# Patient Record
Sex: Male | Born: 1960 | Race: White | Hispanic: No | Marital: Single | Smoking: Current every day smoker
Health system: Southern US, Community
[De-identification: ages and names within clinical notes are randomized; demographics above are authoritative.]

## PROBLEM LIST (undated history)

## (undated) DIAGNOSIS — I739 Peripheral vascular disease, unspecified: Secondary | ICD-10-CM

## (undated) DIAGNOSIS — F419 Anxiety disorder, unspecified: Secondary | ICD-10-CM

## (undated) DIAGNOSIS — F17200 Nicotine dependence, unspecified, uncomplicated: Secondary | ICD-10-CM

## (undated) DIAGNOSIS — R066 Hiccough: Secondary | ICD-10-CM

## (undated) DIAGNOSIS — E785 Hyperlipidemia, unspecified: Secondary | ICD-10-CM

## (undated) DIAGNOSIS — I10 Essential (primary) hypertension: Secondary | ICD-10-CM

## (undated) DIAGNOSIS — K029 Dental caries, unspecified: Secondary | ICD-10-CM

## (undated) DIAGNOSIS — I639 Cerebral infarction, unspecified: Secondary | ICD-10-CM

## (undated) DIAGNOSIS — L03119 Cellulitis of unspecified part of limb: Secondary | ICD-10-CM

## (undated) DIAGNOSIS — I2510 Atherosclerotic heart disease of native coronary artery without angina pectoris: Secondary | ICD-10-CM

## (undated) DIAGNOSIS — L02619 Cutaneous abscess of unspecified foot: Secondary | ICD-10-CM

## (undated) DIAGNOSIS — M47817 Spondylosis without myelopathy or radiculopathy, lumbosacral region: Secondary | ICD-10-CM

## (undated) DIAGNOSIS — M359 Systemic involvement of connective tissue, unspecified: Secondary | ICD-10-CM

## (undated) DIAGNOSIS — I219 Acute myocardial infarction, unspecified: Secondary | ICD-10-CM

## (undated) HISTORY — PX: LEG SURGERY: SHX1003

## (undated) HISTORY — DX: Cerebral infarction, unspecified: I63.9

## (undated) HISTORY — DX: Peripheral vascular disease, unspecified: I73.9

## (undated) HISTORY — DX: Atherosclerotic heart disease of native coronary artery without angina pectoris: I25.10

---

## 2007-09-05 ENCOUNTER — Ambulatory Visit: Payer: Self-pay | Admitting: Cardiology

## 2011-01-17 HISTORY — PX: INTRADISCAL INJECTION: SHX1840

## 2011-01-19 ENCOUNTER — Other Ambulatory Visit: Payer: Self-pay | Admitting: Neurosurgery

## 2011-01-19 DIAGNOSIS — M5126 Other intervertebral disc displacement, lumbar region: Secondary | ICD-10-CM

## 2011-01-25 ENCOUNTER — Ambulatory Visit
Admission: RE | Admit: 2011-01-25 | Discharge: 2011-01-25 | Disposition: A | Discharge status: Home / Self Care | Payer: Medicaid Other | Source: Ambulatory Visit | Attending: Neurosurgery | Admitting: Neurosurgery

## 2011-01-25 DIAGNOSIS — M5126 Other intervertebral disc displacement, lumbar region: Secondary | ICD-10-CM

## 2011-01-25 MED ORDER — METHYLPREDNISOLONE ACETATE 40 MG/ML INJ SUSP (RADIOLOG
120.0000 mg | Freq: Once | INTRAMUSCULAR | Status: AC
  Administered 2011-01-25: 120 mg via EPIDURAL

## 2011-01-25 MED ORDER — IOHEXOL 180 MG/ML  SOLN
1.0000 mL | Freq: Once | INTRAMUSCULAR | Status: AC | PRN
  Administered 2011-01-25: 1 mL via EPIDURAL

## 2011-02-28 ENCOUNTER — Other Ambulatory Visit: Payer: Self-pay | Admitting: Neurosurgery

## 2011-02-28 DIAGNOSIS — M5126 Other intervertebral disc displacement, lumbar region: Secondary | ICD-10-CM

## 2011-03-14 ENCOUNTER — Ambulatory Visit
Admission: RE | Admit: 2011-03-14 | Discharge: 2011-03-14 | Disposition: A | Discharge status: Home / Self Care | Payer: Medicaid Other | Source: Ambulatory Visit | Attending: Neurosurgery | Admitting: Neurosurgery

## 2011-03-14 DIAGNOSIS — M5126 Other intervertebral disc displacement, lumbar region: Secondary | ICD-10-CM

## 2011-03-14 MED ORDER — IOHEXOL 180 MG/ML  SOLN
1.0000 mL | Freq: Once | INTRAMUSCULAR | Status: AC | PRN
  Administered 2011-03-14: 1 mL via EPIDURAL

## 2011-03-14 MED ORDER — METHYLPREDNISOLONE ACETATE 40 MG/ML INJ SUSP (RADIOLOG
120.0000 mg | Freq: Once | INTRAMUSCULAR | Status: AC
  Administered 2011-03-14: 120 mg via EPIDURAL

## 2014-07-04 ENCOUNTER — Encounter (HOSPITAL_COMMUNITY)
Admission: EM | Disposition: A | Discharge status: Home / Self Care | Payer: Medicaid Other | Source: Home / Self Care | Attending: Cardiovascular Disease

## 2014-07-04 ENCOUNTER — Encounter: Payer: Self-pay | Admitting: Cardiology

## 2014-07-04 ENCOUNTER — Inpatient Hospital Stay (HOSPITAL_COMMUNITY)
Admission: EM | Admit: 2014-07-04 | Discharge: 2014-07-06 | DRG: 247 | Disposition: A | Discharge status: Home / Self Care | Payer: Medicare Other | Attending: Cardiovascular Disease | Admitting: Cardiovascular Disease

## 2014-07-04 DIAGNOSIS — I10 Essential (primary) hypertension: Secondary | ICD-10-CM | POA: Diagnosis present

## 2014-07-04 DIAGNOSIS — I2510 Atherosclerotic heart disease of native coronary artery without angina pectoris: Secondary | ICD-10-CM | POA: Diagnosis present

## 2014-07-04 DIAGNOSIS — I2119 ST elevation (STEMI) myocardial infarction involving other coronary artery of inferior wall: Secondary | ICD-10-CM | POA: Diagnosis present

## 2014-07-04 DIAGNOSIS — I2121 ST elevation (STEMI) myocardial infarction involving left circumflex coronary artery: Secondary | ICD-10-CM

## 2014-07-04 DIAGNOSIS — I213 ST elevation (STEMI) myocardial infarction of unspecified site: Secondary | ICD-10-CM | POA: Diagnosis present

## 2014-07-04 DIAGNOSIS — F17200 Nicotine dependence, unspecified, uncomplicated: Secondary | ICD-10-CM | POA: Diagnosis present

## 2014-07-04 DIAGNOSIS — R945 Abnormal results of liver function studies: Secondary | ICD-10-CM

## 2014-07-04 DIAGNOSIS — R7989 Other specified abnormal findings of blood chemistry: Secondary | ICD-10-CM

## 2014-07-04 DIAGNOSIS — E785 Hyperlipidemia, unspecified: Secondary | ICD-10-CM

## 2014-07-04 DIAGNOSIS — Z720 Tobacco use: Secondary | ICD-10-CM | POA: Diagnosis not present

## 2014-07-04 DIAGNOSIS — F17210 Nicotine dependence, cigarettes, uncomplicated: Secondary | ICD-10-CM | POA: Diagnosis present

## 2014-07-04 HISTORY — DX: Essential (primary) hypertension: I10

## 2014-07-04 HISTORY — PX: LEFT HEART CATHETERIZATION WITH CORONARY ANGIOGRAM: SHX5451

## 2014-07-04 HISTORY — DX: Nicotine dependence, unspecified, uncomplicated: F17.200

## 2014-07-04 HISTORY — DX: Spondylosis without myelopathy or radiculopathy, lumbosacral region: M47.817

## 2014-07-04 HISTORY — DX: Anxiety disorder, unspecified: F41.9

## 2014-07-04 LAB — COMPREHENSIVE METABOLIC PANEL
ALK PHOS: 55 U/L (ref 39–117)
ALT: 58 U/L — ABNORMAL HIGH (ref 0–53)
AST: 241 U/L — ABNORMAL HIGH (ref 0–37)
Albumin: 3.3 g/dL — ABNORMAL LOW (ref 3.5–5.2)
Anion gap: 9 (ref 5–15)
BILIRUBIN TOTAL: 0.6 mg/dL (ref 0.3–1.2)
BUN: 8 mg/dL (ref 6–23)
CALCIUM: 8.7 mg/dL (ref 8.4–10.5)
CO2: 23 mmol/L (ref 19–32)
Chloride: 102 mmol/L (ref 96–112)
Creatinine, Ser: 0.97 mg/dL (ref 0.50–1.35)
GFR calc Af Amer: >90 mL/min (ref >90)
Glucose, Bld: 161 mg/dL — ABNORMAL HIGH (ref 70–99)
Potassium: 3.8 mmol/L (ref 3.5–5.1)
SODIUM: 134 mmol/L — AB (ref 135–145)
Total Protein: 6.6 g/dL (ref 6.0–8.3)

## 2014-07-04 LAB — CBC WITH DIFFERENTIAL/PLATELET
BASOS ABS: 0.1 10*3/uL (ref 0.0–0.1)
BASOS PCT: 1 % (ref 0–1)
Eosinophils Absolute: 0.2 10*3/uL (ref 0.0–0.7)
Eosinophils Relative: 2 % (ref 0–5)
HCT: 43.1 % (ref 39.0–52.0)
Hemoglobin: 14.9 g/dL (ref 13.0–17.0)
LYMPHS PCT: 15 % (ref 12–46)
Lymphs Abs: 1.7 10*3/uL (ref 0.7–4.0)
MCH: 32 pg (ref 26.0–34.0)
MCHC: 34.6 g/dL (ref 30.0–36.0)
MCV: 92.7 fL (ref 78.0–100.0)
Monocytes Absolute: 0.7 10*3/uL (ref 0.1–1.0)
Monocytes Relative: 7 % (ref 3–12)
NEUTROS ABS: 8.4 10*3/uL — AB (ref 1.7–7.7)
NEUTROS PCT: 75 % (ref 43–77)
PLATELETS: 152 10*3/uL (ref 150–400)
RBC: 4.65 MIL/uL (ref 4.22–5.81)
RDW: 13.3 % (ref 11.5–15.5)
WBC: 11 10*3/uL — AB (ref 4.0–10.5)

## 2014-07-04 LAB — TROPONIN I: Troponin I: 45.41 ng/mL (ref <0.031)

## 2014-07-04 LAB — TSH: TSH: 0.597 u[IU]/mL (ref 0.350–4.500)

## 2014-07-04 SURGERY — LEFT HEART CATHETERIZATION WITH CORONARY ANGIOGRAM
Anesthesia: LOCAL

## 2014-07-04 MED ORDER — ACETAMINOPHEN 325 MG PO TABS
650.0000 mg | ORAL_TABLET | ORAL | Status: DC | PRN
  Administered 2014-07-05: 650 mg via ORAL
  Filled 2014-07-04: qty 2

## 2014-07-04 MED ORDER — SODIUM CHLORIDE 0.9 % IV SOLN
INTRAVENOUS | Status: DC
  Administered 2014-07-04: 21:00:00 via INTRAVENOUS

## 2014-07-04 MED ORDER — FENTANYL CITRATE 0.05 MG/ML IJ SOLN
INTRAMUSCULAR | Status: AC
  Filled 2014-07-04: qty 2

## 2014-07-04 MED ORDER — BIVALIRUDIN 250 MG IV SOLR
INTRAVENOUS | Status: AC
  Filled 2014-07-04: qty 250

## 2014-07-04 MED ORDER — ONDANSETRON HCL 4 MG/2ML IJ SOLN: 4.0000 mg | Freq: Four times a day (QID) | INTRAMUSCULAR | Status: DC | PRN

## 2014-07-04 MED ORDER — ATORVASTATIN CALCIUM 80 MG PO TABS: 80.0000 mg | ORAL_TABLET | Freq: Every day | ORAL | Status: DC

## 2014-07-04 MED ORDER — HEPARIN (PORCINE) IN NACL 2-0.9 UNIT/ML-% IJ SOLN
INTRAMUSCULAR | Status: AC
  Filled 2014-07-04: qty 1000

## 2014-07-04 MED ORDER — SODIUM CHLORIDE 0.9 % IV SOLN
1.0000 mg/kg/h | INTRAVENOUS | Status: DC
  Administered 2014-07-04: 1 mg/kg/h via INTRAVENOUS
  Filled 2014-07-04: qty 250

## 2014-07-04 MED ORDER — NITROGLYCERIN 1 MG/10 ML FOR IR/CATH LAB
INTRA_ARTERIAL | Status: AC
  Filled 2014-07-04: qty 10

## 2014-07-04 MED ORDER — ASPIRIN 81 MG PO CHEW
81.0000 mg | CHEWABLE_TABLET | Freq: Every day | ORAL | Status: DC
  Administered 2014-07-05: 81 mg via ORAL
  Filled 2014-07-04: qty 1

## 2014-07-04 MED ORDER — ONDANSETRON HCL 4 MG/2ML IJ SOLN
4.0000 mg | Freq: Four times a day (QID) | INTRAMUSCULAR | Status: DC | PRN
  Administered 2014-07-04: 4 mg via INTRAVENOUS
  Filled 2014-07-04: qty 2

## 2014-07-04 MED ORDER — ZOLPIDEM TARTRATE 5 MG PO TABS
5.0000 mg | ORAL_TABLET | Freq: Every evening | ORAL | Status: DC | PRN
  Administered 2014-07-05: 5 mg via ORAL
  Filled 2014-07-04: qty 1

## 2014-07-04 MED ORDER — ASPIRIN EC 81 MG PO TBEC: 81.0000 mg | DELAYED_RELEASE_TABLET | Freq: Every day | ORAL | Status: DC

## 2014-07-04 MED ORDER — METOPROLOL TARTRATE 25 MG PO TABS
25.0000 mg | ORAL_TABLET | Freq: Two times a day (BID) | ORAL | Status: DC
  Administered 2014-07-04 – 2014-07-06 (×4): 25 mg via ORAL
  Filled 2014-07-04 (×5): qty 1

## 2014-07-04 MED ORDER — LIDOCAINE HCL (PF) 1 % IJ SOLN
INTRAMUSCULAR | Status: AC
  Filled 2014-07-04: qty 30

## 2014-07-04 MED ORDER — NITROGLYCERIN IN D5W 200-5 MCG/ML-% IV SOLN: 0.0000 ug/min | INTRAVENOUS | Status: DC

## 2014-07-04 MED ORDER — MORPHINE SULFATE 2 MG/ML IJ SOLN: 2.0000 mg | INTRAMUSCULAR | Status: DC | PRN

## 2014-07-04 MED ORDER — ALPRAZOLAM 0.25 MG PO TABS
0.2500 mg | ORAL_TABLET | Freq: Two times a day (BID) | ORAL | Status: DC | PRN
  Administered 2014-07-05: 0.25 mg via ORAL
  Filled 2014-07-04: qty 1

## 2014-07-04 MED ORDER — HYDRALAZINE HCL 20 MG/ML IJ SOLN
10.0000 mg | INTRAMUSCULAR | Status: DC | PRN
  Administered 2014-07-05: 10 mg via INTRAVENOUS
  Filled 2014-07-04: qty 1

## 2014-07-04 MED ORDER — NITROGLYCERIN 0.4 MG SL SUBL: 0.4000 mg | SUBLINGUAL_TABLET | SUBLINGUAL | Status: DC | PRN

## 2014-07-04 MED ORDER — NITROGLYCERIN IN D5W 200-5 MCG/ML-% IV SOLN
INTRAVENOUS | Status: AC
  Filled 2014-07-04: qty 250

## 2014-07-04 MED ORDER — PANTOPRAZOLE SODIUM 40 MG PO TBEC
40.0000 mg | DELAYED_RELEASE_TABLET | Freq: Every day | ORAL | Status: DC
  Administered 2014-07-05 – 2014-07-06 (×2): 40 mg via ORAL
  Filled 2014-07-04 (×2): qty 1

## 2014-07-04 MED ORDER — MIDAZOLAM HCL 2 MG/2ML IJ SOLN
INTRAMUSCULAR | Status: AC
  Filled 2014-07-04: qty 2

## 2014-07-04 MED ORDER — TICAGRELOR 90 MG PO TABS
ORAL_TABLET | ORAL | Status: AC
  Filled 2014-07-04: qty 1

## 2014-07-04 MED ORDER — ATORVASTATIN CALCIUM 80 MG PO TABS
80.0000 mg | ORAL_TABLET | Freq: Every day | ORAL | Status: DC
  Administered 2014-07-05: 80 mg via ORAL
  Filled 2014-07-04 (×2): qty 1

## 2014-07-04 MED ORDER — ACETAMINOPHEN 325 MG PO TABS
650.0000 mg | ORAL_TABLET | ORAL | Status: DC | PRN
  Administered 2014-07-04 – 2014-07-06 (×2): 650 mg via ORAL
  Filled 2014-07-04 (×2): qty 2

## 2014-07-04 MED ORDER — LISINOPRIL 5 MG PO TABS
5.0000 mg | ORAL_TABLET | Freq: Every day | ORAL | Status: DC
  Administered 2014-07-04 – 2014-07-06 (×3): 5 mg via ORAL
  Filled 2014-07-04 (×3): qty 1

## 2014-07-04 MED ORDER — TICAGRELOR 90 MG PO TABS
90.0000 mg | ORAL_TABLET | Freq: Two times a day (BID) | ORAL | Status: DC
  Administered 2014-07-05 – 2014-07-06 (×3): 90 mg via ORAL
  Filled 2014-07-04 (×6): qty 1

## 2014-07-04 NOTE — Progress Notes (Signed)
Chaplain responded to Code STEMI.  EMT indicated that wife was supposed to come, and she was last seen leaving [facility]. Chaplain waited in ED area and then checked in with staff there and with Cath docs (procedure was underway).  Please page if family arrives.    Debby Budalacios, Nolia Tschantz [place], [place]plain [phone]

## 2014-07-04 NOTE — Progress Notes (Signed)
eLink Physician-Brief Progress Note Patient Name: Eugene Parker DOB: 04/11/1961 MRN: [id number]  [name] Date of Service  07/04/2014  HPI/Events of Note  [age] [facility].  Now s/p CC with stent to circumflex.  No major PMH with the exception of smoker/HTN.  Currently patient is alert HD stable with sats of 99%.  He is on NTG and anticoagulation gtts.  eICU Interventions  Plan of care per primary cardiology team. Continue to monitor via [facility]     Intervention Category Evaluation Type: New Patient Evaluation  [name] 07/04/2014, 9:44 PM

## 2014-07-04 NOTE — H&P (Signed)
     Patient ID: Eugene Parker MRN: [id number], DOB/AGE: 56/08/1960   Admit date: 07/04/2014   Primary Physician: No primary care provider on file. Primary Cardiologist: [place] from [place]  HPI: 54 y/o single male, smoker, HTN, no prior histpry of CAD, [facility] with SSCP which began about 6:30 pm. EKG shows inferior and lateral ST elevation. STEMI activated and the pt was [facility] by EMS [facility]. He is stable but continuing to have significant chest pain on arrival.   Problem List: Past Medical History  Diagnosis Date  . DJD (degenerative joint disease), lumbosacral     No past surgical history on file.   Allergies: No Known Allergies   Home Medications "BP med".     Unable to obtain family history at this time   History   Social History  . Marital Status: Single    Spouse Name: N/A  . Number of Children: N/A  . Years of Education: N/A   Occupational History  . Not on file.   Social History Main Topics  . Smoking status: Not on file  . Smokeless tobacco: Not on file  . Alcohol Use: Not on file  . Drug Use: Not on file  . Sexual Activity: Not on file   Other Topics Concern  . Not on file   Social History Narrative  . No narrative on file     Review of Systems: not obtained pt in cath lab  Physical Exam: B/P 149/74      , Pulse, 66 R- 16  General appearance: alert, cooperative and mild distress Neck: no carotid bruit and no JVD Lungs: scattered wheezing Heart: regular rate and rhythm Abdomen: soft, non-tender; bowel sounds normal; no masses,  no organomegaly Extremities: extremities normal, atraumatic, no cyanosis or edema Pulses: 2+ and symmetric Rt radial pulse 1+ Skin: Skin color, texture, turgor normal. No rashes or lesions Neurologic: Grossly normal    Labs: [facility]    Radiology/Studies: No results found.  EKG: NSR, SB inferior, lateral STEMI  ASSESSMENT AND  PLAN:  Principal Problem:   Current smoker Active Problems:   ST elevation myocardial infarction (STEMI) of inferior wall   Essential hypertension   PLAN:  Urgent cath, no beta blocker secondary to bradycardia, add Lipitor 80 mg.   Deland PrettySigned, Alizandra Loh K, PA-C 07/04/2014, 7:39 PM

## 2014-07-04 NOTE — CV Procedure (Signed)
Eugene Parker is a 54 y.o. male    [id number] LOCATION:  FACILITY: [facility]  PHYSICIAN: Quay Burow, M.D. 04-30-1960   DATE OF PROCEDURE:  07/04/2014  DATE OF DISCHARGE:     CARDIAC CATHETERIZATION     History obtained from chart review.Eugene Parker is a 54 year old mildly overweight Caucasian male without prior cardiac history. His factors include tobacco abuse and hypertension. He developed chest pain for the first time earlier this evening and went to [facility] where he was noted to have inferolateral ST segment elevation and he was transferred to [facility] by EMS for urgent intervention.   PROCEDURE DESCRIPTION:   The patient was brought to the second floor [facility]iac cath lab in the postabsorptive state. He was premedicated with Valium 5 mg by mouth, IV Versed and fentanyl. His right groinwas prepped and shaved in usual sterile fashion. Xylocaine 1% was used for local anesthesia. A 6 French sheath was inserted into the right common femoral artery using standard Seldinger technique. The patient received 4000 units  of heparin  Intravenously in the [facility] emergency room prior to transport.  6 French right and left Judkins diagnostic catheters as well as a 6 French pigtail catheter were used for selective coronary angiography and left ventriculography respectively. Visipaque dye was used for the entirety of the case. Retrograde aortic, left ventricular and pullback pressures were recorded.    HEMODYNAMICS:    AO SYSTOLIC/AO DIASTOLIC: 470/96   LV SYSTOLIC/LV DIASTOLIC: 283/66  ANGIOGRAPHIC RESULTS:   1. Left main; normal  2. LAD; minor irregularities 3. Left circumflex; codominant with occluded mid AV groove after OM 3.  4. Right coronary artery; codominant with 40-50% segmental mid 7. Left ventriculography; RAO left ventriculogram was performed using  25 mL of Visipaque dye at 12 mL/second. The overall LVEF estimated  45-50 %  With wall  motion abnormalities moderate inferoapical and posterolateral hypokinesia  IMPRESSION:Eugene Parker has been occluded codominant circumflex with otherwise noncritical CAD. We will proceed with direct intervention using Angiomax, Brilenta, and drug eluting stent.  Procedure description: The patient received Angiomax bolus and infusion with an ACT of 435. A total of 185 mL of contrast was administered to the patient. Using a 6 [place] XB 3.5 similar guide catheter along with a 1/190 cm long pro-water guidewire and a 2 mm x 12 mm Euphora  balloon the lesion was crossed and angioplasty performed. The door to balloon time was 26 minutes. Following this  A 2.75 mm x 23 mm long Xience drug-eluting stent was then carefully positioned and deployed at 12 atm and postdilated with a 3 mm x 20 mm long Eagar Euphora balloon at 15 atm (3.1 mm) resulting in reduction with total occlusion to 0% residual with TIMI-3 flow and no dissection. The patient tolerated the procedure well. There are no hemodynamic or electrocardiographic sequela. The guidewire and catheter were removed and the sheath was secured.   Final impression: successful PCI and stenting of a occluded mid codominant circumflex in the setting of an acute inferolateral myocardial infarction with a door to balloon time 26 minutes using Angiomax, Proventil and drug-eluting stent. The patient had noncritical disease otherwise with preserved LV function. He'll be treated with usual medications including beta blocker, statin drug and ACE inhibitor. His sheath will be removed in several hours and pressure held. He will be fast tracked with anticipated discharge in 48-72 hours. He left the lab in stable condition.  Lorretta Harp MD, [facility] 07/04/2014 8:21 PM

## 2014-07-05 ENCOUNTER — Encounter (HOSPITAL_COMMUNITY): Payer: Self-pay | Admitting: Cardiovascular Disease

## 2014-07-05 DIAGNOSIS — I2119 ST elevation (STEMI) myocardial infarction involving other coronary artery of inferior wall: Principal | ICD-10-CM

## 2014-07-05 DIAGNOSIS — Z720 Tobacco use: Secondary | ICD-10-CM

## 2014-07-05 DIAGNOSIS — E785 Hyperlipidemia, unspecified: Secondary | ICD-10-CM

## 2014-07-05 DIAGNOSIS — I10 Essential (primary) hypertension: Secondary | ICD-10-CM

## 2014-07-05 DIAGNOSIS — R7989 Other specified abnormal findings of blood chemistry: Secondary | ICD-10-CM

## 2014-07-05 LAB — CBC
HCT: 41.9 % (ref 39.0–52.0)
Hemoglobin: 14.6 g/dL (ref 13.0–17.0)
MCH: 32.4 pg (ref 26.0–34.0)
MCHC: 34.8 g/dL (ref 30.0–36.0)
MCV: 93.1 fL (ref 78.0–100.0)
Platelets: 148 10*3/uL — ABNORMAL LOW (ref 150–400)
RBC: 4.5 MIL/uL (ref 4.22–5.81)
RDW: 13.4 % (ref 11.5–15.5)
WBC: 10.8 10*3/uL — ABNORMAL HIGH (ref 4.0–10.5)

## 2014-07-05 LAB — TROPONIN I
Troponin I: >80 ng/mL (ref <0.031)
Troponin I: 78.13 ng/mL (ref <0.031)

## 2014-07-05 LAB — GLUCOSE, CAPILLARY: Glucose-Capillary: 176 mg/dL — ABNORMAL HIGH (ref 70–99)

## 2014-07-05 LAB — BASIC METABOLIC PANEL
ANION GAP: 8 (ref 5–15)
BUN: 7 mg/dL (ref 6–23)
CO2: 23 mmol/L (ref 19–32)
Calcium: 8.8 mg/dL (ref 8.4–10.5)
Chloride: 104 mmol/L (ref 96–112)
Creatinine, Ser: 0.81 mg/dL (ref 0.50–1.35)
Glucose, Bld: 121 mg/dL — ABNORMAL HIGH (ref 70–99)
Potassium: 3.8 mmol/L (ref 3.5–5.1)
Sodium: 135 mmol/L (ref 135–145)

## 2014-07-05 LAB — LIPID PANEL
CHOL/HDL RATIO: 5.4 ratio
CHOLESTEROL: 151 mg/dL (ref 0–200)
HDL: 28 mg/dL — ABNORMAL LOW (ref >39)
LDL Cholesterol: 98 mg/dL (ref 0–99)
TRIGLYCERIDES: 127 mg/dL (ref <150)
VLDL: 25 mg/dL (ref 0–40)

## 2014-07-05 MED ORDER — OXYCODONE-ACETAMINOPHEN 5-325 MG PO TABS
2.0000 | ORAL_TABLET | Freq: Four times a day (QID) | ORAL | Status: DC | PRN
  Administered 2014-07-05 – 2014-07-06 (×4): 2 via ORAL
  Filled 2014-07-05 (×6): qty 2

## 2014-07-05 MED ORDER — SODIUM CHLORIDE 0.9 % IV SOLN: INTRAVENOUS | Status: DC | PRN

## 2014-07-05 MED ORDER — ATROPINE SULFATE 0.1 MG/ML IJ SOLN
INTRAMUSCULAR | Status: AC
  Filled 2014-07-05: qty 10

## 2014-07-05 NOTE — Progress Notes (Signed)
Phase I Cardiac Rehab  Pt was just waking up from nap and did not feel up to walking, but has been walking independently in hallway.  Reviewed pt d/c education.  Discussed diet, exercise, restriction, MI book, stent book and card, NTG use, when to call 911/MD.  Pt declined Phase II Cardiac Rehab stating that it would be too far to drive.  He was able to teach back but said he would try to work on some of the changes thinking he would not be willing to make all the changes.  We also discussed quitting smoking which he was open to and was given a fake cigarette to help.  He seemed more motivated with this than the other changes.  Pt voiced understanding and was encouraged to continue to walk.   We will f/u on Monday if still admitted. Fabio PierceJessica Ben Sanz, MA, ACSM RCEP

## 2014-07-05 NOTE — Progress Notes (Addendum)
Patient Name: Eugene Parker Date of Encounter: 07/05/2014  Principal Problem:   Current smoker Active Problems:   ST elevation myocardial infarction (STEMI) of inferior wall   Essential hypertension   STEMI (ST elevation myocardial infarction)   Length of Stay: 1  SUBJECTIVE  The patient feels well, denies chest pain or SOB.   CURRENT MEDS . aspirin  81 mg Oral Daily  . atorvastatin  80 mg Oral q1800  . lisinopril  5 mg Oral Daily  . metoprolol tartrate  25 mg Oral BID  . pantoprazole  40 mg Oral Q0600  . ticagrelor  90 mg Oral BID    OBJECTIVE  Filed Vitals:   07/05/14 0530 07/05/14 0600 07/05/14 0700 07/05/14 0800  BP: 115/55 104/67 118/61 106/63  Pulse: 69 67 64 62  Temp:      TempSrc:      Resp: 9 11 4 13   Height:      Weight:      SpO2: 99% 100% 100% 100%    Intake/Output Summary (Last 24 hours) at 07/05/14 0932 Last data filed at 07/05/14 0800  Gross per 24 hour  Intake 1478.81 ml  Output    600 ml  Net 878.81 ml   Filed Weights   07/04/14 1935 07/04/14 2100 07/05/14 0457  Weight: 220 lb (99.791 kg) 206 lb 2.1 oz (93.5 kg) 211 lb 10.3 oz (96 kg)    PHYSICAL EXAM  General: Pleasant, NAD. Neuro: Alert and oriented X 3. Moves all extremities spontaneously. Psych: Normal affect. HEENT:  Normal  Neck: Supple without bruits or JVD. Lungs:  Resp regular and unlabored, CTA. Heart: RRR no s3, s4, or murmurs. Abdomen: Soft, non-tender, non-distended, BS + x 4.  Extremities: No clubbing, cyanosis or edema. DP/PT/Radials 2+ and equal bilaterally.  Accessory Clinical Findings  CBC  Recent Labs  07/04/14 2222 07/05/14 0432  WBC 11.0* 10.8*  NEUTROABS 8.4*  --   HGB 14.9 14.6  HCT 43.1 41.9  MCV 92.7 93.1  PLT 152 148*   Basic Metabolic Panel  Recent Labs  07/04/14 2222 07/05/14 0432  NA 134* 135  K 3.8 3.8  CL 102 104  CO2 23 23  GLUCOSE 161* 121*  BUN 8 7  CREATININE 0.97 0.81  CALCIUM 8.7 8.8   Liver Function  Tests  Recent Labs  07/04/14 2222  AST 241*  ALT 58*  ALKPHOS 55  BILITOT 0.6  PROT 6.6  ALBUMIN 3.3*   No results for input(s): LIPASE, AMYLASE in the last 72 hours. Cardiac Enzymes  Recent Labs  07/04/14 2222 07/05/14 0432  TROPONINI 45.41* >80.00*    Recent Labs  07/05/14 0432  CHOL 151  HDL 28*  LDLCALC 98  TRIG 161127  CHOLHDL 5.4   Thyroid Function Tests  Recent Labs  07/04/14 2223  TSH 0.597   Radiology/Studies  No results found.  TELE: SR, PVCs, couplets  ECG: SR, resolved STE in inferolateral leads, now negative T waves in inferolateral leads  Left cardiac cath : 07/04/2014 ANGIOGRAPHIC RESULTS:  1. Left main; normal  2. LAD; minor irregularities 3. Left circumflex; codominant with occluded mid AV groove after OM 3.  4. Right coronary artery; codominant with 40-50% segmental mid 7. Left ventriculography; RAO left ventriculogram was performed using  25 mL of Visipaque dye at 12 mL/second. The overall LVEF estimated  45-50 % With wall motion abnormalities moderate inferoapical and posterolateral hypokinesia  IMPRESSION:[name] has been occluded codominant circumflex with otherwise noncritical  CAD. We will proceed with direct intervention using Angiomax, Brilenta, and drug eluting stent.   ASSESSMENT AND PLAN  54 year old male   1. CAD, STEMI on 07/04/2014 with occluded codominant circumflex with otherwise noncritical CAD, s/p successful PCI and stenting with DES - continue DAPT for at least 1 year with ASA 81 mg PO daily and Brillinta - continue high dose atorvastatin 80 mg po daily - contiue metoprolol and lisinopril - Crea normal  2. Hypertension - controlled  3. Hyperlipidemia - on atorvastatin 80 mg po daily  4. Elevated LFTs - we will recheck today, if uptrending we will decrease the dose of atorvastatin  5. Smoking cessation - counseling provided  Transfer to the telemetry.    Signed, Lars Masson MD,  [facility] 07/05/2014

## 2014-07-05 NOTE — Progress Notes (Signed)
Pulled R femoral sheath beginning at 0427. Vitals stable throughout. Pulses palpable prior and throughout. Atropine at bedside. No complications. Pulled back 10cc blood prior. Held manual pressure for 20 minutes. No bleeding noted upon finishing holding pressure at 0447. Site at level 0 upon completion. Post sheath education completed. Will continue to monitor patient.

## 2014-07-06 DIAGNOSIS — R945 Abnormal results of liver function studies: Secondary | ICD-10-CM

## 2014-07-06 DIAGNOSIS — E785 Hyperlipidemia, unspecified: Secondary | ICD-10-CM

## 2014-07-06 DIAGNOSIS — R7989 Other specified abnormal findings of blood chemistry: Secondary | ICD-10-CM

## 2014-07-06 LAB — HEPATIC FUNCTION PANEL
ALT: 49 U/L (ref 0–53)
AST: 87 U/L — ABNORMAL HIGH (ref 0–37)
Albumin: 3.2 g/dL — ABNORMAL LOW (ref 3.5–5.2)
Alkaline Phosphatase: 52 U/L (ref 39–117)
BILIRUBIN TOTAL: 0.5 mg/dL (ref 0.3–1.2)
Bilirubin, Direct: 0.1 mg/dL (ref 0.0–0.5)
Indirect Bilirubin: 0.4 mg/dL (ref 0.3–0.9)
TOTAL PROTEIN: 6.8 g/dL (ref 6.0–8.3)

## 2014-07-06 MED ORDER — ATORVASTATIN CALCIUM 80 MG PO TABS: 80.0000 mg | ORAL_TABLET | Freq: Every day | ORAL | Status: DC

## 2014-07-06 MED ORDER — ASPIRIN 81 MG PO CHEW: 81.0000 mg | CHEWABLE_TABLET | Freq: Every day | ORAL | Status: DC

## 2014-07-06 MED ORDER — TICAGRELOR 90 MG PO TABS: 90.0000 mg | ORAL_TABLET | Freq: Two times a day (BID) | ORAL | Status: DC

## 2014-07-06 MED ORDER — PANTOPRAZOLE SODIUM 40 MG PO TBEC: 40.0000 mg | DELAYED_RELEASE_TABLET | Freq: Every day | ORAL | Status: DC

## 2014-07-06 MED ORDER — NITROGLYCERIN 0.4 MG SL SUBL: 0.4000 mg | SUBLINGUAL_TABLET | SUBLINGUAL | Status: DC | PRN

## 2014-07-06 MED ORDER — LISINOPRIL 5 MG PO TABS: 5.0000 mg | ORAL_TABLET | Freq: Every day | ORAL | Status: DC

## 2014-07-06 NOTE — Discharge Summary (Signed)
Physician Discharge Summary  Patient ID: Eugene Parker MRN: [id number] DOB/AGE: 56/08/1960 56 y.o.  Primary Cardiologist: Dr. Allyson SabalBerry ([place])  Admit date: 07/04/2014 Discharge date: 07/06/2014  Admission Diagnoses: Inferior STEMI  Discharge Diagnoses:  Principal Problem:   Current smoker Active Problems:   ST elevation myocardial infarction (STEMI) of inferior wall   Essential hypertension   Hyperlipidemia   Elevated LFTs   Discharged Condition: stable  Hospital Course: 54 y/o single male, smoker, HTN, no prior history of CAD, [facility] with SSCP which began about 6:30 pm. EKG showed inferior and lateral ST elevation. STEMI activated and the pt [facility]. On arrival, he was stable but with ongoing CP. The procedure was performed by Dr. Allyson SabalBerry, he was found to have occluded codominant circumflex with otherwise noncritical CAD. The occluded circumflex was treated with PCI + DES. LVEF by cath was 45-50%. He tolerated the procedure well and left the cath lab in stable condition. His CP resolved. Troponin peaked at >80 but serial enzymes showed downward trend. He was placed on DAPT with ASA + Brilinta along with BB, ACE and statin. It should be noted that initial LFTs were elevated with AST at 241 and ALT at 58, however follow-up studies showed significant downward trend with AST at 87 and ALT at 49. High dose Lipitor was continued. He had no post cath complications. He remained CP free and did well with cardiac rehab. HR and BP remained stable. He was last seen and examined by Dr. Myrtis SerKatz who determined he was stable for discharge home. Post hospital f/u will be arranged in 1-2 weeks. He will be followed by Dr. Allyson SabalBerry.   Consults: None   Cardiac Panel (last 3 results)  Recent Labs  07/04/14 2222 07/05/14 0432 07/05/14 1600  TROPONINI 45.41* >80.00* 78.13*     Significant Diagnostic Studies:   LHC 07/04/14 HEMODYNAMICS:     AO SYSTOLIC/AO DIASTOLIC: 170/88  LV SYSTOLIC/LV DIASTOLIC: 166/26  ANGIOGRAPHIC RESULTS:   1. Left main; normal  2. LAD; minor irregularities 3. Left circumflex; codominant with occluded mid AV groove after OM 3.  4. Right coronary artery; codominant with 40-50% segmental mid 7. Left ventriculography; RAO left ventriculogram was performed using  25 mL of Visipaque dye at 12 mL/second. The overall LVEF estimated  45-50 % With wall motion abnormalities moderate inferoapical and posterolateral hypokinesia   Treatments: See Hospital Course  Discharge Exam: Blood pressure 124/63, pulse 73, temperature 98 F (36.7 C), temperature source Oral, resp. rate 18, height 5\' 11"  (1.803 m), weight 209 lb 14.4 oz (95.21 kg), SpO2 98 %.   Disposition: Home     Medication List    TAKE these medications        aspirin 81 MG chewable tablet  Chew 1 tablet (81 mg total) by mouth daily.     atorvastatin 80 MG tablet  Commonly known as:  LIPITOR  Take 1 tablet (80 mg total) by mouth daily at 6 PM.     lisinopril 5 MG tablet  Commonly known as:  PRINIVIL,ZESTRIL  Take 1 tablet (5 mg total) by mouth daily.     metoprolol tartrate 25 MG tablet  Commonly known as:  LOPRESSOR  Take 25 mg by mouth 2 (two) times daily.     nitroGLYCERIN 0.4 MG SL tablet  Commonly known as:  NITROSTAT  Place 1 tablet (0.4 mg total) under the tongue every 5 (five) minutes x 3 doses as  needed for chest pain.     oxyCODONE-acetaminophen 10-325 MG per tablet  Commonly known as:  PERCOCET  Take 1 tablet by mouth every 6 (six) hours as needed for pain (for shoulder).     pantoprazole 40 MG tablet  Commonly known as:  PROTONIX  Take 1 tablet (40 mg total) by mouth daily at 6 (six) AM.     ticagrelor 90 MG Tabs tablet  Commonly known as:  BRILINTA  Take 1 tablet (90 mg total) by mouth 2 (two) times daily.     ticagrelor 90 MG Tabs tablet  Commonly known as:  BRILINTA  Take 1 tablet (90 mg total)  by mouth 2 (two) times daily.       Follow-up Information    Follow up with Runell Gess, MD.   Specialty:  Cardiology   Why:  our office will call you with a follow-up appointment   Contact information:   [address] [place] [address] [phone]       TIME SPENT ON DISCHARGE SUMMARY, INCLUDING PHYSICIAN TIME: >30 MINUTES  Signed: Robbie Lis 07/06/2014, 4:56 PM  Patient seen and examined. I agree with the assessment and plan as detailed above. See also my additional thoughts below.   Please refer to my progress note from today also. The patient is quite stable after PCI for an acute coronary syndrome. He is stable to go home today. I made the decision for him to go home. I agree with all of the plans made in the note above.  Willa Rough, MD, [facility] 07/07/2014 7:41 AM

## 2014-07-06 NOTE — Care Management Note (Signed)
    Page 1 of 1   07/06/2014     4:39:15 PM CARE MANAGEMENT NOTE 07/06/2014  Patient:  Eugene Parker, Eugene Parker   Account Number:  [id number]  Date Initiated:  07/06/2014  Documentation initiated by:  [facility]  Subjective/Objective Assessment:   adm: tx to [facility] with acute inferolateral MI.     Action/Plan:   discharge planning   Anticipated DC Date:  07/06/2014   Anticipated DC Plan:  [place]  CM consult  Medication Assistance      Choice offered to / List presented to:             Status of service:  Completed, signed off Medicare Important Message given?   (If response is "NO", the following Medicare IM given date fields will be blank) Date Medicare IM given:   Medicare IM given by:   Date Additional Medicare IM given:   Additional Medicare IM given by:    Discharge Disposition:  HOME/SELF CARE  Per UR Regulation:    If discussed at Long Length of Stay Meetings, dates discussed:    Comments:  07/06/14 16:35 Cm met with pt in room and gave pt 30 day free trial card for brilinta.  Pt verbalizes understanding this will give the office staff enough time to get the meidcation authorized to cover the cost of the refills. Despite EPIC, pt DOES HAVE BOTH MEDICAID AND MEDICARE. System reflects no insurance. Pt states he has both.  No other Cm needs were communicated.  Mariane Masters, BSN, Cm [phone].

## 2014-07-06 NOTE — Progress Notes (Signed)
Discharged to home with family office visits in place teaching done Discharged to home with family office visits in place teaching done  

## 2014-07-06 NOTE — Progress Notes (Signed)
Report from lab k-2.7 informed Eugene Parker he said " I will put in orders"

## 2014-07-06 NOTE — Progress Notes (Signed)
SUBJECTIVE: The patient had an ST elevation MI on July 04, 2014. He was treated very rapidly with PCI to the circumflex. He has done very well. The plan was for him to be fast tracked to go home if stable. He is been ambulating without difficulties. There was mention yesterday that he has some elevation of his LFTs. These will be repeated this morning. If they are rising, his statin dose will have to be reduced.   Filed Vitals:   07/05/14 1019 07/05/14 1300 07/05/14 2052 07/06/14 0539  BP: 154/76 97/62 118/58 125/84  Pulse: 70 63 68 68  Temp:  97.7 F (36.5 C) 97.6 F (36.4 C) 99.3 F (37.4 C)  TempSrc:  Oral Oral Oral  Resp:  16 18 18   Height:      Weight:    209 lb 14.4 oz (95.21 kg)  SpO2:  100% 98% 98%     Intake/Output Summary (Last 24 hours) at 07/06/14 1028 Last data filed at 07/06/14 0900  Gross per 24 hour  Intake    720 ml  Output      0 ml  Net    720 ml    LABS: Basic Metabolic Panel:  Recent Labs  11/91/4703/18/16 2222 07/05/14 0432  NA 134* 135  K 3.8 3.8  CL 102 104  CO2 23 23  GLUCOSE 161* 121*  BUN 8 7  CREATININE 0.97 0.81  CALCIUM 8.7 8.8   Liver Function Tests:  Recent Labs  07/04/14 2222  AST 241*  ALT 58*  ALKPHOS 55  BILITOT 0.6  PROT 6.6  ALBUMIN 3.3*   No results for input(s): LIPASE, AMYLASE in the last 72 hours. CBC:  Recent Labs  07/04/14 2222 07/05/14 0432  WBC 11.0* 10.8*  NEUTROABS 8.4*  --   HGB 14.9 14.6  HCT 43.1 41.9  MCV 92.7 93.1  PLT 152 148*   Cardiac Enzymes:  Recent Labs  07/04/14 2222 07/05/14 0432 07/05/14 1600  TROPONINI 45.41* >80.00* 78.13*   BNP: Invalid input(s): POCBNP D-Dimer: No results for input(s): DDIMER in the last 72 hours. Hemoglobin A1C: No results for input(s): HGBA1C in the last 72 hours. Fasting Lipid Panel:  Recent Labs  07/05/14 0432  CHOL 151  HDL 28*  LDLCALC 98  TRIG [id number]  CHOLHDL 5.4   Thyroid Function Tests:  Recent Labs  07/04/14 2223  TSH 0.597     RADIOLOGY: No results found.  PHYSICAL EXAM   patient is oriented to person time and place. Affect is normal. He looks great. Lungs are clear. Respiratory effort is not labored. Cardiac exam reveals an S1 and S2. Abdomen is soft. There is no peripheral edema. The cath site in his right groin is completely stable.   TELEMETRY:  I have reviewed telemetry today July 06, 2014. There is normal sinus rhythm.  ASSESSMENT AND PLAN:    Current smoker    ST elevation myocardial infarction (STEMI) of inferior wall     Patient was treated very rapidly with PCI. He is stable and will be able to go home later today.    Essential hypertension    Hyperlipidemia     He is on a statin now. We will await follow-up liver function studies before deciding on the final dose of his statin.    Elevated LFTs    I have just ordered stat follow-up liver function studies. If they are stable he can remain on his current statin dose. If they're going up, we  will have to lower his statin dose.  His acute MI was in the circumflex distribution. It was noted that he could be on the fast track to go home if stable. He is doing extremely well. He can be discharged late this afternoon.   Willa Rough 07/06/2014 10:28 AM

## 2014-07-07 LAB — POCT I-STAT, CHEM 8
BUN: 9 mg/dL (ref 6–23)
Calcium, Ion: 1.24 mmol/L — ABNORMAL HIGH (ref 1.12–1.23)
Chloride: 103 mmol/L (ref 96–112)
Creatinine, Ser: 1 mg/dL (ref 0.50–1.35)
Glucose, Bld: 143 mg/dL — ABNORMAL HIGH (ref 70–99)
HCT: 44 % (ref 39.0–52.0)
HEMOGLOBIN: 15 g/dL (ref 13.0–17.0)
Potassium: 3.9 mmol/L (ref 3.5–5.1)
SODIUM: 140 mmol/L (ref 135–145)
TCO2: 22 mmol/L (ref 0–100)

## 2014-07-07 LAB — POCT ACTIVATED CLOTTING TIME: Activated Clotting Time: 435 seconds

## 2014-07-07 MED FILL — Sodium Chloride IV Soln 0.9%: INTRAVENOUS | Qty: 50 | Status: AC

## 2014-07-16 ENCOUNTER — Encounter: Payer: Self-pay | Admitting: Cardiology

## 2014-07-16 ENCOUNTER — Telehealth: Payer: Self-pay | Admitting: Cardiology

## 2014-07-18 NOTE — Telephone Encounter (Signed)
Closed Encounter  °

## 2014-08-06 ENCOUNTER — Ambulatory Visit: Payer: Medicaid Other | Admitting: Cardiology

## 2014-10-24 ENCOUNTER — Encounter: Payer: Self-pay | Admitting: Cardiology

## 2014-10-24 ENCOUNTER — Ambulatory Visit (INDEPENDENT_AMBULATORY_CARE_PROVIDER_SITE_OTHER): Payer: Medicare Other | Admitting: Cardiology

## 2014-10-24 VITALS — BP 119/71 | HR 72 | Ht 71.0 in | Wt 214.0 lb

## 2014-10-24 DIAGNOSIS — I2510 Atherosclerotic heart disease of native coronary artery without angina pectoris: Secondary | ICD-10-CM | POA: Diagnosis not present

## 2014-10-24 MED ORDER — TICAGRELOR 90 MG PO TABS: 90.0000 mg | ORAL_TABLET | Freq: Two times a day (BID) | ORAL | Status: DC

## 2014-10-24 MED ORDER — LISINOPRIL 5 MG PO TABS: 5.0000 mg | ORAL_TABLET | Freq: Every day | ORAL | Status: DC

## 2014-10-24 MED ORDER — NITROGLYCERIN 0.4 MG SL SUBL: 0.4000 mg | SUBLINGUAL_TABLET | SUBLINGUAL | Status: DC | PRN

## 2014-10-24 MED ORDER — PANTOPRAZOLE SODIUM 40 MG PO TBEC: 40.0000 mg | DELAYED_RELEASE_TABLET | Freq: Every day | ORAL | Status: DC

## 2014-10-24 MED ORDER — ATORVASTATIN CALCIUM 80 MG PO TABS: 80.0000 mg | ORAL_TABLET | Freq: Every day | ORAL | Status: DC

## 2014-10-24 MED ORDER — METOPROLOL TARTRATE 25 MG PO TABS: 25.0000 mg | ORAL_TABLET | Freq: Two times a day (BID) | ORAL | Status: DC

## 2014-10-24 NOTE — Patient Instructions (Signed)
Your physician has requested that you have an echocardiogram. Echocardiography is a painless test that uses sound waves to create images of your heart. It provides your doctor with information about the size and shape of your heart and how well your heart's chambers and valves are working. This procedure takes approximately one hour. There are no restrictions for this procedure. Office will contact with results via phone or letter.   Medication refills sent to [facility] today. Samples of Brilinta also given. Continue all current medications. Follow up in  3 weeks.

## 2014-10-24 NOTE — Progress Notes (Signed)
Patient ID: Niraj Kudrna, male   DOB: 03/22/1961, 54 y.o.   MRN: [id number]     Clinical Summary Mr. Fenlon is a 54 y.o.male seen as hospital follow up, this is our first visit together.  1. CAD - hx of inferior STEMI 06/2014, received DES to LCX.  - no echo in system, LVEF by cath 45-50%  - notes some occasional chest pain, typically occurs with stress. Burning pain right chest, +SOB. 8/10 in severity. Not positional. Better with NG. Lasts 10-15 minutes. Similar to previous pain. Going on since hospital. Stable in severity and frequency. No relation to food - taking lisionpril, but no other meds     Past Medical History  Diagnosis Date  . DJD (degenerative joint disease), lumbosacral   . Essential hypertension   . Current smoker   . Anxiety      Allergies  Allergen Reactions  . Codeine Nausea Only     Current Outpatient Prescriptions  Medication Sig Dispense Refill  . aspirin 81 MG chewable tablet Chew 1 tablet (81 mg total) by mouth daily.    Marland Kitchen atorvastatin (LIPITOR) 80 MG tablet Take 1 tablet (80 mg total) by mouth daily at 6 PM. 30 tablet 5  . lisinopril (PRINIVIL,ZESTRIL) 5 MG tablet Take 1 tablet (5 mg total) by mouth daily. 30 tablet 5  . metoprolol tartrate (LOPRESSOR) 25 MG tablet Take 25 mg by mouth 2 (two) times daily.  11  . nitroGLYCERIN (NITROSTAT) 0.4 MG SL tablet Place 1 tablet (0.4 mg total) under the tongue every 5 (five) minutes x 3 doses as needed for chest pain. 25 tablet 2  . oxyCODONE-acetaminophen (PERCOCET) 10-325 MG per tablet Take 1 tablet by mouth every 6 (six) hours as needed for pain (for shoulder).    . pantoprazole (PROTONIX) 40 MG tablet Take 1 tablet (40 mg total) by mouth daily at 6 (six) AM. 30 tablet 5  . ticagrelor (BRILINTA) 90 MG TABS tablet Take 1 tablet (90 mg total) by mouth 2 (two) times daily. 60 tablet 10  . ticagrelor (BRILINTA) 90 MG TABS tablet Take 1 tablet (90 mg total) by mouth 2 (two) times daily. 60 tablet 0   No  current facility-administered medications for this visit.     Past Surgical History  Procedure Laterality Date  . Intradiscal injection  Oct 2012    L5-S1  . Leg surgery    . Left heart catheterization with coronary angiogram N/A 07/04/2014    Procedure: LEFT HEART CATHETERIZATION WITH CORONARY ANGIOGRAM;  Surgeon: Lorretta Harp, MD;  Location: [facility] CATH LAB;  Service: Cardiovascular;  Laterality: N/A;     Allergies  Allergen Reactions  . Codeine Nausea Only      No family history on file.   Social History Mr. Gaertner reports that he has been smoking Cigarettes.  He has a 40 pack-year smoking history. He does not have any smokeless tobacco history on file. Mr. Schear reports that he drinks alcohol.   Review of Systems CONSTITUTIONAL: No weight loss, fever, chills, weakness or fatigue.  HEENT: Eyes: No visual loss, blurred vision, double vision or yellow sclerae.No hearing loss, sneezing, congestion, runny nose or sore throat.  SKIN: No rash or itching.  CARDIOVASCULAR: per HPI RESPIRATORY: No shortness of breath, cough or sputum.  GASTROINTESTINAL: No anorexia, nausea, vomiting or diarrhea. No abdominal pain or blood.  GENITOURINARY: No burning on urination, no polyuria NEUROLOGICAL: No headache, dizziness, syncope, paralysis, ataxia, numbness or tingling in the extremities. No change  in bowel or bladder control.  MUSCULOSKELETAL: No muscle, back pain, joint pain or stiffness.  LYMPHATICS: No enlarged nodes. No history of splenectomy.  PSYCHIATRIC: No history of depression or anxiety.  ENDOCRINOLOGIC: No reports of sweating, cold or heat intolerance. No polyuria or polydipsia.  Marland Kitchen   Physical Examination Filed Vitals:   10/24/14 0920  BP: 119/71  Pulse: 72   Filed Vitals:   10/24/14 0920  Height: $Remove'5\' 11"'cFSGqYv$  (1.803 m)  Weight: 214 lb (97.07 kg)    Gen: resting comfortably, no acute distress HEENT: no scleral icterus, pupils equal round and reactive, no palptable  cervical adenopathy,  CV: RRR, no m/r/g,no JVD Resp: Clear to auscultation bilaterally GI: abdomen is soft, non-tender, non-distended, normal bowel sounds, no hepatosplenomegaly MSK: extremities are warm, no edema.  Skin: warm, no rash Neuro:  no focal deficits Psych: appropriate affect   Diagnostic Studies 06/2014 Cath HEMODYNAMICS:   AO SYSTOLIC/AO DIASTOLIC: 856/31  LV SYSTOLIC/LV DIASTOLIC: 497/02  ANGIOGRAPHIC RESULTS:   1. Left main; normal  2. LAD; minor irregularities 3. Left circumflex; codominant with occluded mid AV groove after OM 3.  4. Right coronary artery; codominant with 40-50% segmental mid 7. Left ventriculography; RAO left ventriculogram was performed using  25 mL of Visipaque dye at 12 mL/second. The overall LVEF estimated  45-50 % With wall motion abnormalities moderate inferoapical and posterolateral hypokinesia  IMPRESSION:Mr. Almanza has been occluded codominant circumflex with otherwise noncritical CAD. We will proceed with direct intervention using Angiomax, Brilenta, and drug eluting stent.  Procedure description: The patient received Angiomax bolus and infusion with an ACT of 435. A total of 185 mL of contrast was administered to the patient. Using a 6 [place] XB 3.5 similar guide catheter along with a 1/190 cm long pro-water guidewire and a 2 mm x 12 mm Euphora balloon the lesion was crossed and angioplasty performed. The door to balloon time was 26 minutes. Following this A 2.75 mm x 23 mm long Xience drug-eluting stent was then carefully positioned and deployed at 12 atm and postdilated with a 3 mm x 20 mm long [place] Euphora balloon at 15 atm (3.1 mm) resulting in reduction with total occlusion to 0% residual with TIMI-3 flow and no dissection. The patient tolerated the procedure well. There are no hemodynamic or electrocardiographic sequela. The guidewire and catheter were removed and the sheath was secured.   Final impression: successful PCI and  stenting of a occluded mid codominant circumflex in the setting of an acute inferolateral myocardial infarction with a door to balloon time 26 minutes using Angiomax, Proventil and drug-eluting stent. The patient had noncritical disease otherwise with preserved LV function. He'll be treated with usual medications including beta blocker, statin drug and ACE inhibitor. His sheath will be removed in several hours and pressure held. He will be fast tracked with anticipated discharge in 48-72 hours. He left the lab in stable condition.        Assessment and Plan  1. CAD - he stopped taking all of his meds other than lisinopril on his own. - emphasized the extreme importance of DAPT and the risk of stent thrombosis and repeat MI. He will restart his ASA and brillinta - notes some mild chest pain unchanged since discharge. Will follow as he resumes his meds, if progresses consider ischemic testing.  - obtain echo  - f/u 2-3 weeks     Arnoldo Lenis, M.D.

## 2014-11-19 ENCOUNTER — Other Ambulatory Visit: Payer: Medicare Other

## 2014-11-20 ENCOUNTER — Ambulatory Visit: Payer: Medicare Other | Admitting: Cardiology

## 2014-11-20 NOTE — Progress Notes (Unsigned)
Patient ID: Eugene Parker, male   DOB: 07/23/1960, 54 y.o.   MRN: [id number]     Clinical Summary [name] is a 54 y.o.male seen today for follow up of the following medical problems.   1. CAD - hx of inferior STEMI 06/2014, received DES to LCX.  - no echo in system, LVEF by cath 45-50%  - notes some occasional chest pain, typically occurs with stress. Burning pain right chest, +SOB. 8/10 in severity. Not positional. Better with NG. Lasts 10-15 minutes. Similar to previous pain. Going on since hospital. Stable in severity and frequency. No relation to food - taking lisionpril, but no other meds  - echo ordered last visit but not completed  Past Medical History  Diagnosis Date  . DJD (degenerative joint disease), lumbosacral   . Essential hypertension   . Current smoker   . Anxiety      Allergies  Allergen Reactions  . Codeine Nausea Only     Current Outpatient Prescriptions  Medication Sig Dispense Refill  . aspirin 81 MG chewable tablet Chew 1 tablet (81 mg total) by mouth daily.    Marland Kitchen atorvastatin (LIPITOR) 80 MG tablet Take 1 tablet (80 mg total) by mouth daily. 30 tablet 11  . lisinopril (PRINIVIL,ZESTRIL) 5 MG tablet Take 1 tablet (5 mg total) by mouth daily. 30 tablet 11  . metoprolol tartrate (LOPRESSOR) 25 MG tablet Take 1 tablet (25 mg total) by mouth 2 (two) times daily. 60 tablet 11  . nitroGLYCERIN (NITROSTAT) 0.4 MG SL tablet Place 1 tablet (0.4 mg total) under the tongue every 5 (five) minutes x 3 doses as needed for chest pain. 25 tablet 2  . oxyCODONE-acetaminophen (PERCOCET) 10-325 MG per tablet Take 1 tablet by mouth every 6 (six) hours as needed for pain (for shoulder).    . pantoprazole (PROTONIX) 40 MG tablet Take 1 tablet (40 mg total) by mouth daily at 6 (six) AM. 30 tablet 11  . ticagrelor (BRILINTA) 90 MG TABS tablet Take 1 tablet (90 mg total) by mouth 2 (two) times daily. 60 tablet 11   No current facility-administered medications for this  visit.     Past Surgical History  Procedure Laterality Date  . Intradiscal injection  Oct 2012    L5-S1  . Leg surgery    . Left heart catheterization with coronary angiogram N/A 07/04/2014    Procedure: LEFT HEART CATHETERIZATION WITH CORONARY ANGIOGRAM;  Surgeon: Lorretta Harp, MD;  Location: [facility] CATH LAB;  Service: Cardiovascular;  Laterality: N/A;     Allergies  Allergen Reactions  . Codeine Nausea Only      Family History  Problem Relation Age of Onset  . Heart disease Mother   . Heart disease Father   . COPD Mother      Social History [name] reports that he has been smoking Cigarettes.  He started smoking about 44 years ago. He has a 40 pack-year smoking history. He has never used smokeless tobacco. [name] reports that he drinks alcohol.   Review of Systems CONSTITUTIONAL: No weight loss, fever, chills, weakness or fatigue.  HEENT: Eyes: No visual loss, blurred vision, double vision or yellow sclerae.No hearing loss, sneezing, congestion, runny nose or sore throat.  SKIN: No rash or itching.  CARDIOVASCULAR:  RESPIRATORY: No shortness of breath, cough or sputum.  GASTROINTESTINAL: No anorexia, nausea, vomiting or diarrhea. No abdominal pain or blood.  GENITOURINARY: No burning on urination, no polyuria NEUROLOGICAL: No headache, dizziness, syncope, paralysis, ataxia,  numbness or tingling in the extremities. No change in bowel or bladder control.  MUSCULOSKELETAL: No muscle, back pain, joint pain or stiffness.  LYMPHATICS: No enlarged nodes. No history of splenectomy.  PSYCHIATRIC: No history of depression or anxiety.  ENDOCRINOLOGIC: No reports of sweating, cold or heat intolerance. No polyuria or polydipsia.  Marland Kitchen   Physical Examination There were no vitals filed for this visit. There were no vitals filed for this visit.  Gen: resting comfortably, no acute distress HEENT: no scleral icterus, pupils equal round and reactive, no palptable cervical  adenopathy,  CV Resp: Clear to auscultation bilaterally GI: abdomen is soft, non-tender, non-distended, normal bowel sounds, no hepatosplenomegaly MSK: extremities are warm, no edema.  Skin: warm, no rash Neuro:  no focal deficits Psych: appropriate affect   Diagnostic Studies 06/2014 Cath HEMODYNAMICS:   AO SYSTOLIC/AO DIASTOLIC: 997/74  LV SYSTOLIC/LV DIASTOLIC: 142/39  ANGIOGRAPHIC RESULTS:   1. Left main; normal  2. LAD; minor irregularities 3. Left circumflex; codominant with occluded mid AV groove after OM 3.  4. Right coronary artery; codominant with 40-50% segmental mid 7. Left ventriculography; RAO left ventriculogram was performed using  25 mL of Visipaque dye at 12 mL/second. The overall LVEF estimated  45-50 % With wall motion abnormalities moderate inferoapical and posterolateral hypokinesia  IMPRESSION:[name] has been occluded codominant circumflex with otherwise noncritical CAD. We will proceed with direct intervention using Angiomax, Brilenta, and drug eluting stent.  Procedure description: The patient received Angiomax bolus and infusion with an ACT of 435. A total of 185 mL of contrast was administered to the patient. Using a 6 [place] XB 3.5 similar guide catheter along with a 1/190 cm long pro-water guidewire and a 2 mm x 12 mm Euphora balloon the lesion was crossed and angioplasty performed. The door to balloon time was 26 minutes. Following this A 2.75 mm x 23 mm long Xience drug-eluting stent was then carefully positioned and deployed at 12 atm and postdilated with a 3 mm x 20 mm long [place] Euphora balloon at 15 atm (3.1 mm) resulting in reduction with total occlusion to 0% residual with TIMI-3 flow and no dissection. The patient tolerated the procedure well. There are no hemodynamic or electrocardiographic sequela. The guidewire and catheter were removed and the sheath was secured.   Final impression: successful PCI and stenting of a occluded mid  codominant circumflex in the setting of an acute inferolateral myocardial infarction with a door to balloon time 26 minutes using Angiomax, Proventil and drug-eluting stent. The patient had noncritical disease otherwise with preserved LV function. He'll be treated with usual medications including beta blocker, statin drug and ACE inhibitor. His sheath will be removed in several hours and pressure held. He will be fast tracked with anticipated discharge in 48-72 hours. He left the lab in stable condition.    Assessment and Plan  1. CAD - he stopped taking all of his meds other than lisinopril on his own. - emphasized the extreme importance of DAPT and the risk of stent thrombosis and repeat MI. He will restart his ASA and brillinta - notes some mild chest pain unchanged since discharge. Will follow as he resumes his meds, if progresses consider ischemic testing.  - obtain echo  - f/u 2-3 weeks      Arnoldo Lenis, M.D.

## 2014-12-08 ENCOUNTER — Ambulatory Visit: Payer: Medicare Other | Admitting: Cardiology

## 2014-12-10 ENCOUNTER — Other Ambulatory Visit: Payer: Self-pay

## 2014-12-10 ENCOUNTER — Ambulatory Visit (INDEPENDENT_AMBULATORY_CARE_PROVIDER_SITE_OTHER): Payer: Medicare Other

## 2014-12-10 DIAGNOSIS — I2510 Atherosclerotic heart disease of native coronary artery without angina pectoris: Secondary | ICD-10-CM

## 2014-12-18 ENCOUNTER — Telehealth: Payer: Self-pay | Admitting: *Deleted

## 2014-12-18 ENCOUNTER — Encounter: Payer: Self-pay | Admitting: *Deleted

## 2014-12-18 NOTE — Telephone Encounter (Signed)
-----   Message from Antoine Poche, MD sent at 12/15/2014 11:18 AM EDT ----- Echo overall looks good  Dominga Ferry MD

## 2014-12-18 NOTE — Telephone Encounter (Signed)
LM X3 days. Mailed pt letter and routed to pcp

## 2014-12-24 ENCOUNTER — Ambulatory Visit (INDEPENDENT_AMBULATORY_CARE_PROVIDER_SITE_OTHER): Payer: Medicare Other | Admitting: Cardiology

## 2014-12-24 ENCOUNTER — Encounter: Payer: Self-pay | Admitting: Cardiology

## 2014-12-24 VITALS — BP 146/66 | HR 62 | Ht 71.0 in | Wt 209.0 lb

## 2014-12-24 DIAGNOSIS — I2510 Atherosclerotic heart disease of native coronary artery without angina pectoris: Secondary | ICD-10-CM

## 2014-12-24 NOTE — Progress Notes (Signed)
Patient ID: Eugene Parker, male   DOB: April 29, 1960, 54 y.o.   MRN: [id number]     Clinical Summary Eugene Parker is a 54 y.o.male seen today for follow up of the following medical problems.   1. CAD - hx of inferior STEMI 06/2014, received DES to LCX.  - echo 11/2014 LVEF 55-60% - last visit he was not taking his medications regularly. We discussed in detail the extreme importance about stricti medication compliance given his recent heart attack and stent placement, specifically the risk of not taking his aspirin and brillinta every day increasing the risk of repeat MI - since last visit he remains inconsistent with medications, and is not quite sure what he is taking at home.   - notes some occasional chest pain, typically occurs with stress. Burning pain right chest, +SOB. 8/10 in severity. Not positional. Typically occurs at rest, states it feels like a "heartburn like pain". Different from his previous angina. He is unsure if he is taking his ppi regularly.    Past Medical History  Diagnosis Date  . DJD (degenerative joint disease), lumbosacral   . Essential hypertension   . Current smoker   . Anxiety      Allergies  Allergen Reactions  . Codeine Nausea Only     Current Outpatient Prescriptions  Medication Sig Dispense Refill  . aspirin 81 MG chewable tablet Chew 1 tablet (81 mg total) by mouth daily.    Marland Kitchen atorvastatin (LIPITOR) 80 MG tablet Take 1 tablet (80 mg total) by mouth daily. 30 tablet 11  . lisinopril (PRINIVIL,ZESTRIL) 5 MG tablet Take 1 tablet (5 mg total) by mouth daily. 30 tablet 11  . metoprolol tartrate (LOPRESSOR) 25 MG tablet Take 1 tablet (25 mg total) by mouth 2 (two) times daily. 60 tablet 11  . nitroGLYCERIN (NITROSTAT) 0.4 MG SL tablet Place 1 tablet (0.4 mg total) under the tongue every 5 (five) minutes x 3 doses as needed for chest pain. 25 tablet 2  . oxyCODONE-acetaminophen (PERCOCET) 10-325 MG per tablet Take 1 tablet by mouth every 6 (six) hours  as needed for pain (for shoulder).    . pantoprazole (PROTONIX) 40 MG tablet Take 1 tablet (40 mg total) by mouth daily at 6 (six) AM. 30 tablet 11  . ticagrelor (BRILINTA) 90 MG TABS tablet Take 1 tablet (90 mg total) by mouth 2 (two) times daily. 60 tablet 11   No current facility-administered medications for this visit.     Past Surgical History  Procedure Laterality Date  . Intradiscal injection  Oct 2012    L5-S1  . Leg surgery    . Left heart catheterization with coronary angiogram N/A 07/04/2014    Procedure: LEFT HEART CATHETERIZATION WITH CORONARY ANGIOGRAM;  Surgeon: Lorretta Harp, MD;  Location: [facility] CATH LAB;  Service: Cardiovascular;  Laterality: N/A;     Allergies  Allergen Reactions  . Codeine Nausea Only      Family History  Problem Relation Age of Onset  . Heart disease Mother   . Heart disease Father   . COPD Mother      Social History Mr. Meadowcroft reports that he has been smoking Cigarettes.  He started smoking about 44 years ago. He has a 40 pack-year smoking history. He has never used smokeless tobacco. Mr. Strahm reports that he drinks alcohol.   Review of Systems CONSTITUTIONAL: No weight loss, fever, chills, weakness or fatigue.  HEENT: Eyes: No visual loss, blurred vision, double vision or yellow sclerae.No  hearing loss, sneezing, congestion, runny nose or sore throat.  SKIN: No rash or itching.  CARDIOVASCULAR: per HPI RESPIRATORY: No shortness of breath, cough or sputum.  GASTROINTESTINAL: No anorexia, nausea, vomiting or diarrhea. No abdominal pain or blood.  GENITOURINARY: No burning on urination, no polyuria NEUROLOGICAL: No headache, dizziness, syncope, paralysis, ataxia, numbness or tingling in the extremities. No change in bowel or bladder control.  MUSCULOSKELETAL: No muscle, back pain, joint pain or stiffness.  LYMPHATICS: No enlarged nodes. No history of splenectomy.  PSYCHIATRIC: No history of depression or anxiety.  ENDOCRINOLOGIC:  No reports of sweating, cold or heat intolerance. No polyuria or polydipsia.  Marland Kitchen   Physical Examination Filed Vitals:   12/24/14 0910  BP: 146/66  Pulse: 62   Filed Vitals:   12/24/14 0910  Height: $Remove'5\' 11"'lCLgPKK$  (1.803 m)  Weight: 209 lb (94.802 kg)    Gen: resting comfortably, no acute distress HEENT: no scleral icterus, pupils equal round and reactive, no palptable cervical adenopathy,  CV: RRR, no m/r/g, no jvd Resp: Clear to auscultation bilaterally GI: abdomen is soft, non-tender, non-distended, normal bowel sounds, no hepatosplenomegaly MSK: extremities are warm, no edema.  Skin: warm, no rash Neuro:  no focal deficits Psych: appropriate affect   Diagnostic Studies 06/2014 Cath HEMODYNAMICS:   AO SYSTOLIC/AO DIASTOLIC: 511/02  LV SYSTOLIC/LV DIASTOLIC: 111/73  ANGIOGRAPHIC RESULTS:   1. Left main; normal  2. LAD; minor irregularities 3. Left circumflex; codominant with occluded mid AV groove after OM 3.  4. Right coronary artery; codominant with 40-50% segmental mid 7. Left ventriculography; RAO left ventriculogram was performed using  25 mL of Visipaque dye at 12 mL/second. The overall LVEF estimated  45-50 % With wall motion abnormalities moderate inferoapical and posterolateral hypokinesia  IMPRESSION:Mr. Hildreth has been occluded codominant circumflex with otherwise noncritical CAD. We will proceed with direct intervention using Angiomax, Brilenta, and drug eluting stent.  Procedure description: The patient received Angiomax bolus and infusion with an ACT of 435. A total of 185 mL of contrast was administered to the patient. Using a 6 [place] XB 3.5 similar guide catheter along with a 1/190 cm long pro-water guidewire and a 2 mm x 12 mm Euphora balloon the lesion was crossed and angioplasty performed. The door to balloon time was 26 minutes. Following this A 2.75 mm x 23 mm long Xience drug-eluting stent was then carefully positioned and deployed at 12 atm and  postdilated with a 3 mm x 20 mm long [place] Euphora balloon at 15 atm (3.1 mm) resulting in reduction with total occlusion to 0% residual with TIMI-3 flow and no dissection. The patient tolerated the procedure well. There are no hemodynamic or electrocardiographic sequela. The guidewire and catheter were removed and the sheath was secured.   Final impression: successful PCI and stenting of a occluded mid codominant circumflex in the setting of an acute inferolateral myocardial infarction with a door to balloon time 26 minutes using Angiomax, Proventil and drug-eluting stent. The patient had noncritical disease otherwise with preserved LV function. He'll be treated with usual medications including beta blocker, statin drug and ACE inhibitor. His sheath will be removed in several hours and pressure held. He will be fast tracked with anticipated discharge in 48-72 hours. He left the lab in stable condition.   11/2014 echo Study Conclusions  - Left ventricle: The cavity size was normal. Wall thickness was increased in a pattern of mild LVH. Systolic function was normal. The estimated ejection fraction was in the range of  55% to 60%. Images were inadequate for LV wall motion assessment. However, no gross regional variation on available images. Diastolic dysfunction, grade indeterminate. - Aortic valve: Mildly to moderately calcified annulus. Mildly calcified leaflets. - Mitral valve: There was trivial regurgitation.     Assessment and Plan  1. CAD - continued mixed compliance with his medications. Went through again the importance of medication compliance, especially his DAPT within the first year to decrease risk of stent thrombosis and MI - he is to call us later today to varify what pills he has been taking at home - notes intermittent heartburn like pain, will follow symptoms as he is more compliant with his cardiac and GI medications. If symptoms progress may consider repeat ischemic  testing.   F/u 3 months   Arnoldo Lenis, M.D.

## 2014-12-24 NOTE — Patient Instructions (Signed)
Your physician recommends that you schedule a follow-up appointment in: 3 months with Dr. Dorita Sciara CALL us WITH MEDICATION LIST  Thank you for choosing [facility]!!

## 2015-03-26 ENCOUNTER — Ambulatory Visit (INDEPENDENT_AMBULATORY_CARE_PROVIDER_SITE_OTHER): Payer: Medicare Other | Admitting: Cardiology

## 2015-03-26 ENCOUNTER — Encounter: Payer: Self-pay | Admitting: *Deleted

## 2015-03-26 ENCOUNTER — Encounter: Payer: Self-pay | Admitting: Cardiology

## 2015-03-26 VITALS — BP 138/84 | HR 60 | Ht 71.0 in | Wt 210.0 lb

## 2015-03-26 DIAGNOSIS — R0602 Shortness of breath: Secondary | ICD-10-CM | POA: Diagnosis not present

## 2015-03-26 DIAGNOSIS — I2510 Atherosclerotic heart disease of native coronary artery without angina pectoris: Secondary | ICD-10-CM

## 2015-03-26 MED ORDER — TICAGRELOR 90 MG PO TABS: 90.0000 mg | ORAL_TABLET | Freq: Two times a day (BID) | ORAL | Status: DC

## 2015-03-26 NOTE — Patient Instructions (Signed)
Your physician recommends that you schedule a follow-up appointment IN 3 MONTHS WITH DR BRANCH (BEGINNING OF MARCH)  Your physician recommends that you continue on your current medications as directed. Please refer to the Current Medication list given to you today.  PLEASE CALL US ABOUT IF YOU ARE TAKING LISINOPRIL  WE HAVE GIVEN YOU SAMPLES OF BRILINTA   WE WILL REQUEST LABS  Thank you for choosing [facility]!!

## 2015-03-26 NOTE — Progress Notes (Signed)
Patient ID: Eugene Parker, male   DOB: 27-Apr-1960, 54 y.o.   MRN: [id number]     Clinical Summary Mr. Adelsberger is a 54 y.o.male seen today for follow up of the following medical problems.   1. CAD - hx of inferior STEMI 06/2014, received DES to LCX.  - echo 11/2014 LVEF 55-60% - compliant with meds.Heartburn like pain improved since taking meds more regularly, mainly occurs after eating.   2. Preop - being considered for left shoulder surgery  Past Medical History  Diagnosis Date  . DJD (degenerative joint disease), lumbosacral   . Essential hypertension   . Current smoker   . Anxiety      Allergies  Allergen Reactions  . Codeine Nausea Only     Current Outpatient Prescriptions  Medication Sig Dispense Refill  . aspirin 81 MG chewable tablet Chew 1 tablet (81 mg total) by mouth daily. (Patient not taking: Reported on 12/24/2014)    . atorvastatin (LIPITOR) 80 MG tablet Take 1 tablet (80 mg total) by mouth daily. (Patient not taking: Reported on 12/24/2014) 30 tablet 11  . lisinopril (PRINIVIL,ZESTRIL) 5 MG tablet Take 1 tablet (5 mg total) by mouth daily. 30 tablet 11  . metoprolol tartrate (LOPRESSOR) 25 MG tablet Take 1 tablet (25 mg total) by mouth 2 (two) times daily. 60 tablet 11  . nitroGLYCERIN (NITROSTAT) 0.4 MG SL tablet Place 1 tablet (0.4 mg total) under the tongue every 5 (five) minutes x 3 doses as needed for chest pain. 25 tablet 2  . oxyCODONE-acetaminophen (PERCOCET) 10-325 MG per tablet Take 1 tablet by mouth every 6 (six) hours as needed for pain (for shoulder).    . pantoprazole (PROTONIX) 40 MG tablet Take 1 tablet (40 mg total) by mouth daily at 6 (six) AM. (Patient not taking: Reported on 12/24/2014) 30 tablet 11  . ticagrelor (BRILINTA) 90 MG TABS tablet Take 1 tablet (90 mg total) by mouth 2 (two) times daily. 60 tablet 11   No current facility-administered medications for this visit.     Past Surgical History  Procedure Laterality Date  . Intradiscal  injection  Oct 2012    L5-S1  . Leg surgery    . Left heart catheterization with coronary angiogram N/A 07/04/2014    Procedure: LEFT HEART CATHETERIZATION WITH CORONARY ANGIOGRAM;  Surgeon: Lorretta Harp, MD;  Location: [facility] CATH LAB;  Service: Cardiovascular;  Laterality: N/A;     Allergies  Allergen Reactions  . Codeine Nausea Only      Family History  Problem Relation Age of Onset  . Heart disease Mother   . Heart disease Father   . COPD Mother      Social History Mr. Kerschner reports that he has been smoking Cigarettes.  He started smoking about 44 years ago. He has a 40 pack-year smoking history. He has never used smokeless tobacco. Mr. Schepers reports that he drinks alcohol.   Review of Systems CONSTITUTIONAL: No weight loss, fever, chills, weakness or fatigue.  HEENT: Eyes: No visual loss, blurred vision, double vision or yellow sclerae.No hearing loss, sneezing, congestion, runny nose or sore throat.  SKIN: No rash or itching.  CARDIOVASCULAR: per hpi RESPIRATORY: +SOB, wheezing  GASTROINTESTINAL: No anorexia, nausea, vomiting or diarrhea. No abdominal pain or blood.  GENITOURINARY: No burning on urination, no polyuria NEUROLOGICAL: No headache, dizziness, syncope, paralysis, ataxia, numbness or tingling in the extremities. No change in bowel or bladder control.  MUSCULOSKELETAL: No muscle, back pain, joint pain or stiffness.  LYMPHATICS: No enlarged nodes. No history of splenectomy.  PSYCHIATRIC: No history of depression or anxiety.  ENDOCRINOLOGIC: No reports of sweating, cold or heat intolerance. No polyuria or polydipsia.  Marland Kitchen   Physical Examination Filed Vitals:   03/26/15 0819  BP: 138/84  Pulse: 60   Filed Vitals:   03/26/15 0819  Height: $Remove'5\' 11"'jKTBigh$  (1.803 m)  Weight: 210 lb (95.255 kg)    Gen: resting comfortably, no acute distress HEENT: no scleral icterus, pupils equal round and reactive, no palptable cervical adenopathy,  CV: RRR, no m/r/g, no  jvd Resp: Clear to auscultation bilaterally GI: abdomen is soft, non-tender, non-distended, normal bowel sounds, no hepatosplenomegaly MSK: extremities are warm, no edema.  Skin: warm, no rash Neuro:  no focal deficits Psych: appropriate affect   Diagnostic Studies 06/2014 Cath HEMODYNAMICS:   AO SYSTOLIC/AO DIASTOLIC: 161/09  LV SYSTOLIC/LV DIASTOLIC: 604/54  ANGIOGRAPHIC RESULTS:   1. Left main; normal  2. LAD; minor irregularities 3. Left circumflex; codominant with occluded mid AV groove after OM 3.  4. Right coronary artery; codominant with 40-50% segmental mid 7. Left ventriculography; RAO left ventriculogram was performed using  25 mL of Visipaque dye at 12 mL/second. The overall LVEF estimated  45-50 % With wall motion abnormalities moderate inferoapical and posterolateral hypokinesia  IMPRESSION:Mr. Ballinas has been occluded codominant circumflex with otherwise noncritical CAD. We will proceed with direct intervention using Angiomax, Brilenta, and drug eluting stent.  Procedure description: The patient received Angiomax bolus and infusion with an ACT of 435. A total of 185 mL of contrast was administered to the patient. Using a 6 [place] XB 3.5 similar guide catheter along with a 1/190 cm long pro-water guidewire and a 2 mm x 12 mm Euphora balloon the lesion was crossed and angioplasty performed. The door to balloon time was 26 minutes. Following this A 2.75 mm x 23 mm long Xience drug-eluting stent was then carefully positioned and deployed at 12 atm and postdilated with a 3 mm x 20 mm long [place] Euphora balloon at 15 atm (3.1 mm) resulting in reduction with total occlusion to 0% residual with TIMI-3 flow and no dissection. The patient tolerated the procedure well. There are no hemodynamic or electrocardiographic sequela. The guidewire and catheter were removed and the sheath was secured.   Final impression: successful PCI and stenting of a occluded mid codominant  circumflex in the setting of an acute inferolateral myocardial infarction with a door to balloon time 26 minutes using Angiomax, Proventil and drug-eluting stent. The patient had noncritical disease otherwise with preserved LV function. He'll be treated with usual medications including beta blocker, statin drug and ACE inhibitor. His sheath will be removed in several hours and pressure held. He will be fast tracked with anticipated discharge in 48-72 hours. He left the lab in stable condition.   11/2014 echo Study Conclusions  - Left ventricle: The cavity size was normal. Wall thickness was increased in a pattern of mild LVH. Systolic function was normal. The estimated ejection fraction was in the range of 55% to 60%. Images were inadequate for LV wall motion assessment. However, no gross regional variation on available images. Diastolic dysfunction, grade indeterminate. - Aortic valve: Mildly to moderately calcified annulus. Mildly calcified leaflets. - Mitral valve: There was trivial regurgitation.    Assessment and Plan  1. CAD - no current symptoms, continue current meds - continue DAPT at least until 06/2014  2. Preoperative evaluation - patient is considering shoulder surgery, recommended to postopone until he  has completed a year of brillinta.   3. SOB - notes some wheezing, SOB at times - obtain PFTs  F/u 3 months      Arnoldo Lenis, M.D.

## 2015-04-07 LAB — PULMONARY FUNCTION TEST

## 2015-04-22 ENCOUNTER — Telehealth: Payer: Self-pay | Admitting: *Deleted

## 2015-04-22 MED ORDER — ALBUTEROL SULFATE HFA 108 (90 BASE) MCG/ACT IN AERS: 2.0000 | INHALATION_SPRAY | Freq: Four times a day (QID) | RESPIRATORY_TRACT | Status: DC | PRN

## 2015-04-22 NOTE — Telephone Encounter (Signed)
-----   Message from Antoine PocheJonathan F Branch, MD sent at 04/21/2015  1:47 PM EST ----- Breathing tests, though somewhat limited, did show some evidence of COPD. Please start albuterol inhaler 2 puffs q6hrs prn SOB and forward results to pcp  Dominga FerryJ Branch MD

## 2015-04-22 NOTE — Telephone Encounter (Signed)
Pt aware, routed to pcp, inhaler sent to pharmacy 

## 2015-06-30 ENCOUNTER — Ambulatory Visit (INDEPENDENT_AMBULATORY_CARE_PROVIDER_SITE_OTHER): Payer: Medicare Other | Admitting: Cardiology

## 2015-06-30 ENCOUNTER — Encounter: Payer: Self-pay | Admitting: Cardiology

## 2015-06-30 VITALS — BP 123/75 | HR 65 | Ht 71.0 in | Wt 211.0 lb

## 2015-06-30 DIAGNOSIS — I2510 Atherosclerotic heart disease of native coronary artery without angina pectoris: Secondary | ICD-10-CM | POA: Diagnosis not present

## 2015-06-30 DIAGNOSIS — Z01810 Encounter for preprocedural cardiovascular examination: Secondary | ICD-10-CM | POA: Diagnosis not present

## 2015-06-30 NOTE — Progress Notes (Signed)
Patient ID: Eugene Parker, male   DOB: [birth year], 55 y.o.   MRN: [id number]     Clinical Summary Eugene Parker is a 55 y.o.male seen today for follow up of the following medical problems.   1. CAD - hx of inferior STEMI 06/2014, received DES to LCX.  - echo 11/2014 LVEF 55-60%  - notes some chest pain after eating or drinking, otherwise no symptoms. No SOB or DOE.   2. Preop - being considered for left shoulder surgery  3. COPD - abnormal PFTs 03/2015,. Started on albuterol prn with improved symptoms. No recent symptoms.    Past Medical History  Diagnosis Date  . DJD (degenerative joint disease), lumbosacral   . Essential hypertension   . Current smoker   . Anxiety      Allergies  Allergen Reactions  . Codeine Nausea Only     Current Outpatient Prescriptions  Medication Sig Dispense Refill  . albuterol (PROVENTIL HFA;VENTOLIN HFA) 108 (90 Base) MCG/ACT inhaler Inhale 2 puffs into the lungs every 6 (six) hours as needed for wheezing or shortness of breath. 1 Inhaler 2  . aspirin 81 MG chewable tablet Chew 1 tablet (81 mg total) by mouth daily.    Marland Kitchen atorvastatin (LIPITOR) 80 MG tablet Take 1 tablet (80 mg total) by mouth daily. 30 tablet 11  . lisinopril (PRINIVIL,ZESTRIL) 5 MG tablet Take 1 tablet (5 mg total) by mouth daily. (Patient not taking: Reported on 03/26/2015) 30 tablet 11  . metoprolol tartrate (LOPRESSOR) 25 MG tablet Take 1 tablet (25 mg total) by mouth 2 (two) times daily. 60 tablet 11  . nitroGLYCERIN (NITROSTAT) 0.4 MG SL tablet Place 1 tablet (0.4 mg total) under the tongue every 5 (five) minutes x 3 doses as needed for chest pain. 25 tablet 2  . pantoprazole (PROTONIX) 40 MG tablet Take 1 tablet (40 mg total) by mouth daily at 6 (six) AM. 30 tablet 11  . ticagrelor (BRILINTA) 90 MG TABS tablet Take 1 tablet (90 mg total) by mouth 2 (two) times daily. 16 tablet 0   No current facility-administered medications for this visit.     Past Surgical History    Procedure Laterality Date  . Intradiscal injection  Oct 2012    L5-S1  . Leg surgery    . Left heart catheterization with coronary angiogram N/A 07/04/2014    Procedure: LEFT HEART CATHETERIZATION WITH CORONARY ANGIOGRAM;  Surgeon: Lorretta Harp, MD;  Location: [facility] CATH LAB;  Service: Cardiovascular;  Laterality: N/A;     Allergies  Allergen Reactions  . Codeine Nausea Only      Family History  Problem Relation Age of Onset  . Heart disease Mother   . Heart disease Father   . COPD Mother      Social History Eugene Parker reports that he has been smoking Cigarettes.  He started smoking about 45 years ago. He has a 40 pack-year smoking history. He has never used smokeless tobacco. Eugene Parker reports that he drinks alcohol.   Review of Systems CONSTITUTIONAL: No weight loss, fever, chills, weakness or fatigue.  HEENT: Eyes: No visual loss, blurred vision, double vision or yellow sclerae.No hearing loss, sneezing, congestion, runny nose or sore throat.  SKIN: No rash or itching.  CARDIOVASCULAR: per hpi RESPIRATORY: No shortness of breath, cough or sputum.  GASTROINTESTINAL: No anorexia, nausea, vomiting or diarrhea. No abdominal pain or blood.  GENITOURINARY: No burning on urination, no polyuria NEUROLOGICAL: No headache, dizziness, syncope, paralysis, ataxia, numbness or  tingling in the extremities. No change in bowel or bladder control.  MUSCULOSKELETAL: No muscle, back pain, joint pain or stiffness.  LYMPHATICS: No enlarged nodes. No history of splenectomy.  PSYCHIATRIC: No history of depression or anxiety.  ENDOCRINOLOGIC: No reports of sweating, cold or heat intolerance. No polyuria or polydipsia.  Marland Kitchen   Physical Examination Filed Vitals:   06/30/15 0818  BP: 123/75  Pulse: 65   Filed Vitals:   06/30/15 0818  Height: _0  (1.803 m)  Weight: 211 lb (95.709 kg)    Gen: resting comfortably, no acute distress HEENT: no scleral icterus, pupils equal round and  reactive, no palptable cervical adenopathy,  CV: RRR, no m/r/g, no jvd Resp: Clear to auscultation bilaterally GI: abdomen is soft, non-tender, non-distended, normal bowel sounds, no hepatosplenomegaly MSK: extremities are warm, no edema.  Skin: warm, no rash Neuro:  no focal deficits Psych: appropriate affect   Diagnostic Studies  06/2014 Cath HEMODYNAMICS:   AO SYSTOLIC/AO DIASTOLIC: 161/09  LV SYSTOLIC/LV DIASTOLIC: 604/54  ANGIOGRAPHIC RESULTS:   1. Left main; normal  2. LAD; minor irregularities 3. Left circumflex; codominant with occluded mid AV groove after OM 3.  4. Right coronary artery; codominant with 40-50% segmental mid 7. Left ventriculography; RAO left ventriculogram was performed using  25 mL of Visipaque dye at 12 mL/second. The overall LVEF estimated  45-50 % With wall motion abnormalities moderate inferoapical and posterolateral hypokinesia  IMPRESSION:Eugene Parker has been occluded codominant circumflex with otherwise noncritical CAD. We will proceed with direct intervention using Angiomax, Brilenta, and drug eluting stent.  Procedure description: The patient received Angiomax bolus and infusion with an ACT of 435. A total of 185 mL of contrast was administered to the patient. Using a 6 [place] XB 3.5 similar guide catheter along with a 1/190 cm long pro-water guidewire and a 2 mm x 12 mm Euphora balloon the lesion was crossed and angioplasty performed. The door to balloon time was 26 minutes. Following this A 2.75 mm x 23 mm long Xience drug-eluting stent was then carefully positioned and deployed at 12 atm and postdilated with a 3 mm x 20 mm long [place] Euphora balloon at 15 atm (3.1 mm) resulting in reduction with total occlusion to 0% residual with TIMI-3 flow and no dissection. The patient tolerated the procedure well. There are no hemodynamic or electrocardiographic sequela. The guidewire and catheter were removed and the sheath was secured.   Final  impression: successful PCI and stenting of a occluded mid codominant circumflex in the setting of an acute inferolateral myocardial infarction with a door to balloon time 26 minutes using Angiomax, Proventil and drug-eluting stent. The patient had noncritical disease otherwise with preserved LV function. He'll be treated with usual medications including beta blocker, statin drug and ACE inhibitor. His sheath will be removed in several hours and pressure held. He will be fast tracked with anticipated discharge in 48-72 hours. He left the lab in stable condition.   11/2014 echo Study Conclusions  - Left ventricle: The cavity size was normal. Wall thickness was increased in a pattern of mild LVH. Systolic function was normal. The estimated ejection fraction was in the range of 55% to 60%. Images were inadequate for LV wall motion assessment. However, no gross regional variation on available images. Diastolic dysfunction, grade indeterminate. - Aortic valve: Mildly to moderately calcified annulus. Mildly calcified leaflets. - Mitral valve: There was trivial regurgitation.  03/2015 PFTs: technically difficult due to poor patient cooperation. Was some evidence of obstruction.  Assessment and Plan    1. CAD - no current symptoms - he will stop his brillinta later this month as he have will completed one year  2. Preoperative evaluation - ok for surgery planned in April from cardiac standpoint      Arnoldo Lenis, M.D.

## 2015-06-30 NOTE — Patient Instructions (Signed)
Your physician wants you to follow-up in: 6 months with Dr. Lurena JoinerBranch You will receive a reminder letter in the mail two months in advance. If you don't receive a letter, please call our office to schedule the follow-up appointment.  Your physician has recommended you make the following change in your medication:   [facility]  Thank you for choosing [facility]!!

## 2015-07-24 ENCOUNTER — Telehealth: Payer: Self-pay | Admitting: *Deleted

## 2015-07-24 NOTE — Telephone Encounter (Signed)
PROAIR PA [id number] approved through 04/17/16 faxed to pharmacy

## 2015-10-09 ENCOUNTER — Other Ambulatory Visit: Payer: Self-pay | Admitting: Cardiology

## 2015-11-10 ENCOUNTER — Other Ambulatory Visit: Payer: Self-pay | Admitting: Cardiology

## 2016-01-19 ENCOUNTER — Other Ambulatory Visit: Payer: Self-pay | Admitting: Vascular Surgery

## 2016-01-19 DIAGNOSIS — I739 Peripheral vascular disease, unspecified: Secondary | ICD-10-CM

## 2016-01-22 ENCOUNTER — Encounter: Payer: Self-pay | Admitting: Vascular Surgery

## 2016-01-27 ENCOUNTER — Other Ambulatory Visit: Payer: Self-pay

## 2016-01-27 ENCOUNTER — Ambulatory Visit (INDEPENDENT_AMBULATORY_CARE_PROVIDER_SITE_OTHER): Payer: Medicare Other | Admitting: Vascular Surgery

## 2016-01-27 ENCOUNTER — Ambulatory Visit (HOSPITAL_COMMUNITY)
Admission: RE | Admit: 2016-01-27 | Discharge: 2016-01-27 | Disposition: A | Discharge status: Home / Self Care | Payer: Medicare Other | Source: Ambulatory Visit | Attending: Vascular Surgery | Admitting: Vascular Surgery

## 2016-01-27 ENCOUNTER — Encounter: Payer: Self-pay | Admitting: Vascular Surgery

## 2016-01-27 VITALS — BP 143/83 | HR 58 | Temp 99.1°F | Resp 16 | Ht 71.0 in | Wt 205.5 lb

## 2016-01-27 DIAGNOSIS — I7025 Atherosclerosis of native arteries of other extremities with ulceration: Secondary | ICD-10-CM

## 2016-01-27 DIAGNOSIS — I739 Peripheral vascular disease, unspecified: Principal | ICD-10-CM

## 2016-01-27 DIAGNOSIS — Z01812 Encounter for preprocedural laboratory examination: Secondary | ICD-10-CM

## 2016-01-27 DIAGNOSIS — L97919 Non-pressure chronic ulcer of unspecified part of right lower leg with unspecified severity: Secondary | ICD-10-CM

## 2016-01-27 DIAGNOSIS — R0989 Other specified symptoms and signs involving the circulatory and respiratory systems: Secondary | ICD-10-CM

## 2016-01-27 DIAGNOSIS — I998 Other disorder of circulatory system: Secondary | ICD-10-CM

## 2016-01-27 LAB — VAS US LOWER EXTREMITY ARTERIAL DUPLEX
LATIBDISTSYS: 19 cm/s
LPOPDPSV: -34 cm/s
LSFDPSV: -32 cm/s
Left popliteal prox sys PSV: 32 cm/s
Left super femoral mid sys PSV: -65 cm/s
Left super femoral prox sys PSV: 67 cm/s
RIGHT ANT DIST TIBAL SYS PSV: 30 cm/s
RIGHT POST TIB DIST SYS: 15 cm/s
RSFMPSV: -50 cm/s
RSFPPSV: 35 cm/s
Right popliteal dist sys PSV: -64 cm/s
Right popliteal prox sys PSV: 187 cm/s
Right super femoral dist sys PSV: -34 cm/s
left post tibial dist sys: 22 cm/s

## 2016-01-27 NOTE — Progress Notes (Signed)
Patient ID: Eugene Parker, male   DOB: 03-18-1961, 55 y.o.   MRN: [id number]  Reason for Consult: New Evaluation (eval ischemic rest pain w/ ulcer - ref by Dr. Tanda Rockers)   Referred by Ernestine Conrad, MD  Subjective:     HPI:  Eugene Parker is a 55 y.o. male with a remote history of traumatic injury to his right lower extremity she has left him with minimal movement at the level of his foot. 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 He has seen at the wound care center where they have done minimal debridement and place him on antibiotics that he is now finished a course of. He continues to smoke 1 pack per day. He has a history of coronary artery disease and is status post stenting in March 2016. Other than his right foot ulceration he also endorses rest pain that has been present for several years and significant claudication in his bilateral calves that prevents him from walking very far at all. He also has leg swelling of his right leg history of a trauma. He has not found any resolution for his pain and ulcer is made this worse. He was given Lortab those are now gone and does not seem to help. He is not currently having any fevers denies history of stroke continues to eat is without weight loss or weight gain.  Past Medical History:  Diagnosis Date  . Anxiety   . Current smoker   . DJD (degenerative joint disease), lumbosacral   . Essential hypertension    Family History  Problem Relation Age of Onset  . Heart disease Mother   . COPD Mother   . Heart disease Father    Past Surgical History:  Procedure Laterality Date  . INTRADISCAL INJECTION  Oct 2012   L5-S1  . LEFT HEART CATHETERIZATION WITH CORONARY ANGIOGRAM N/A 07/04/2014   Procedure: LEFT HEART CATHETERIZATION WITH CORONARY ANGIOGRAM;  Surgeon: Runell Gess, MD;  Location: [facility] CATH LAB;  Service:  Cardiovascular;  Laterality: N/A;  . LEG SURGERY      Short Social History:  Social History  Substance Use Topics  . Smoking status: Current Every Day Smoker    Packs/day: 1.00    Years: 40.00    Types: Cigarettes    Start date: 04/22/1970  . Smokeless tobacco: Never Used  . Alcohol use 0.0 oz/week     Comment: occasisional    Allergies  Allergen Reactions  . Codeine Nausea Only    Current Outpatient Prescriptions  Medication Sig Dispense Refill  . aspirin 81 MG chewable tablet Chew 1 tablet (81 mg total) by mouth daily.    Marland Kitchen atorvastatin (LIPITOR) 80 MG tablet TAKE ONE TABLET BY MOUTH DAILY 30 tablet 3  . hydrOXYzine (ATARAX/VISTARIL) 25 MG tablet Take 1 mg by mouth at bedtime.  3  . lisinopril (PRINIVIL,ZESTRIL) 5 MG tablet TAKE ONE TABLET BY MOUTH DAILY 30 tablet 3  . metoprolol tartrate (LOPRESSOR) 25 MG tablet TAKE ONE TABLET BY MOUTH TWICE DAILY 60 tablet 3  . nitroGLYCERIN (NITROSTAT) 0.4 MG SL tablet Place 1 tablet (0.4 mg total) under the tongue every 5 (five) minutes x 3 doses as needed for chest pain. 25 tablet 2  . pantoprazole (PROTONIX) 40 MG tablet TAKE ONE TABLET BY MOUTH DAILY AT 6AM 30 tablet 3  . PROAIR HFA 108 (  90 Base) MCG/ACT inhaler INHALE BY MOUTH AS DIRECTED 8.5 g 3  . ticagrelor (BRILINTA) 90 MG TABS tablet Take 1 tablet (90 mg total) by mouth 2 (two) times daily. 16 tablet 0  . VITAMIN D HIGH POTENCY 1000 units capsule Take 1,000 Units by mouth daily.  11  . oxyCODONE-acetaminophen (PERCOCET) 10-325 MG tablet Take 1 tablet by mouth every 6 (six) hours as needed.  0   No current facility-administered medications for this visit.     Review of Systems  Constitutional:  Constitutional negative. HENT: HENT negative.  Eyes: Eyes negative.  Respiratory: Respiratory negative.  Cardiovascular: Positive for claudication and leg swelling.  GI: Gastrointestinal negative.  Musculoskeletal: Positive for joint pain.  Skin: Positive for wound.  Neurological:  Positive for dizziness.  Hematologic: Hematologic/lymphatic negative.  Psychiatric: Psychiatric negative.        Objective:  Objective   Vitals:   01/27/16 0952 01/27/16 0956  BP: (!) 150/86 (!) 143/83  Pulse: (!) 58   Resp: 16   Temp: 99.1 F (37.3 C)   TempSrc: Oral   SpO2: 100%   Weight: 205 lb 8 oz (93.2 kg)   Height: 5\' 11"  (1.803 m)    Body mass index is 28.66 kg/m.  Physical Exam  Constitutional: He is oriented to person, place, and time. He appears well-developed.  HENT:  Head: Atraumatic.  Very poor dentition  Eyes: EOM are normal.  Neck: Normal range of motion.  Cardiovascular: Normal rate.   Pulses:      Radial pulses are 2+ on the right side, and 2+ on the left side.       Femoral pulses are 0 on the right side, and 0 on the left side. Monophasic signals at bilateral dp/pt  Abdominal: Soft. He exhibits no mass.  Musculoskeletal: Normal range of motion. He exhibits no edema.  Neurological: He is alert and oriented to person, place, and time.  Skin:  Ulceration of R medial foot at proximal aspect. 2cm x 2cm  Psychiatric: He has a normal mood and affect. His behavior is normal. Judgment and thought content normal.    Data: ABI on right 0.72 (0.81. [facility] bilaterally are monophasic.     Assessment/Plan:   55 year old white male with a history of low-level trauma to his right foot now with nonhealing wound evaluated in the wound care clinic and sent here urgently which is certainly appropriate. He does take aspirin and a statin but isn't continued smoker at 1 pack per day. He has significant history of coronary disease status post stenting 1-1/2 years ago. He is now without chest pain. I cannot feel femoral pulses in the office today and so we'll send him for urgent CT angiogram of his abdomen and pelvis with bilateral lower extremity runoff. The plan for revascularization we based on that scan and I'll see him back when that is completed. I will  otherwise counseled him on the likelihood of amputation in the future should no revascularization options be present. He also needs to stop smoking urgently as the best thing he can do for his health. Should surgery be required he will need coronary evaluation by Dr. Wyline MoodBranch.     Maeola HarmanBrandon Christopher Signe Tackitt MD [facility]

## 2016-01-28 ENCOUNTER — Encounter: Payer: Self-pay | Admitting: Vascular Surgery

## 2016-01-29 ENCOUNTER — Ambulatory Visit: Payer: Medicare Other | Admitting: Vascular Surgery

## 2016-02-01 ENCOUNTER — Other Ambulatory Visit: Payer: Self-pay | Admitting: *Deleted

## 2016-02-03 ENCOUNTER — Other Ambulatory Visit: Payer: Self-pay | Admitting: *Deleted

## 2016-02-03 ENCOUNTER — Encounter (HOSPITAL_COMMUNITY)
Admission: RE | Disposition: A | Discharge status: Home / Self Care | Payer: Self-pay | Source: Ambulatory Visit | Attending: Vascular Surgery

## 2016-02-03 ENCOUNTER — Ambulatory Visit (HOSPITAL_COMMUNITY)
Admission: RE | Admit: 2016-02-03 | Discharge: 2016-02-03 | Disposition: A | Discharge status: Home / Self Care | Payer: Medicare Other | Source: Ambulatory Visit | Attending: Vascular Surgery | Admitting: Vascular Surgery

## 2016-02-03 DIAGNOSIS — L97519 Non-pressure chronic ulcer of other part of right foot with unspecified severity: Secondary | ICD-10-CM | POA: Diagnosis not present

## 2016-02-03 DIAGNOSIS — F17210 Nicotine dependence, cigarettes, uncomplicated: Secondary | ICD-10-CM | POA: Insufficient documentation

## 2016-02-03 DIAGNOSIS — Z9862 Peripheral vascular angioplasty status: Secondary | ICD-10-CM

## 2016-02-03 DIAGNOSIS — I70235 Atherosclerosis of native arteries of right leg with ulceration of other part of foot: Secondary | ICD-10-CM | POA: Insufficient documentation

## 2016-02-03 DIAGNOSIS — I739 Peripheral vascular disease, unspecified: Secondary | ICD-10-CM

## 2016-02-03 DIAGNOSIS — I998 Other disorder of circulatory system: Secondary | ICD-10-CM | POA: Diagnosis not present

## 2016-02-03 HISTORY — PX: PERIPHERAL VASCULAR CATHETERIZATION: SHX172C

## 2016-02-03 LAB — POCT I-STAT, CHEM 8
BUN: 8 mg/dL (ref 6–20)
CALCIUM ION: 0.95 mmol/L — AB (ref 1.15–1.40)
CHLORIDE: 111 mmol/L (ref 101–111)
CREATININE: 0.9 mg/dL (ref 0.61–1.24)
Glucose, Bld: 81 mg/dL (ref 65–99)
HEMATOCRIT: 38 % — AB (ref 39.0–52.0)
Hemoglobin: 12.9 g/dL — ABNORMAL LOW (ref 13.0–17.0)
Potassium: 3.7 mmol/L (ref 3.5–5.1)
Sodium: 145 mmol/L (ref 135–145)
TCO2: 23 mmol/L (ref 0–100)

## 2016-02-03 LAB — POCT I-STAT 4, (NA,K, GLUC, HGB,HCT)
GLUCOSE: 91 mg/dL (ref 65–99)
HCT: 44 % (ref 39.0–52.0)
HEMOGLOBIN: 15 g/dL (ref 13.0–17.0)
POTASSIUM: 4.1 mmol/L (ref 3.5–5.1)
Sodium: 141 mmol/L (ref 135–145)

## 2016-02-03 LAB — POCT ACTIVATED CLOTTING TIME: Activated Clotting Time: 191 seconds

## 2016-02-03 SURGERY — ABDOMINAL AORTOGRAM
Anesthesia: LOCAL | Laterality: Right

## 2016-02-03 MED ORDER — FENTANYL CITRATE (PF) 100 MCG/2ML IJ SOLN
INTRAMUSCULAR | Status: AC
  Filled 2016-02-03: qty 2

## 2016-02-03 MED ORDER — SODIUM CHLORIDE 0.9 % IV SOLN
INTRAVENOUS | Status: DC
  Administered 2016-02-03: 12:00:00 via INTRAVENOUS

## 2016-02-03 MED ORDER — MORPHINE SULFATE (PF) 2 MG/ML IV SOLN: 2.0000 mg | INTRAVENOUS | Status: DC | PRN

## 2016-02-03 MED ORDER — LIDOCAINE HCL (PF) 1 % IJ SOLN
INTRAMUSCULAR | Status: DC | PRN
  Administered 2016-02-03: 20 mL via SUBCUTANEOUS

## 2016-02-03 MED ORDER — FENTANYL CITRATE (PF) 100 MCG/2ML IJ SOLN
INTRAMUSCULAR | Status: DC | PRN
  Administered 2016-02-03: 50 ug via INTRAVENOUS
  Administered 2016-02-03: 25 ug via INTRAVENOUS
  Administered 2016-02-03: 50 ug via INTRAVENOUS
  Administered 2016-02-03: 25 ug via INTRAVENOUS
  Administered 2016-02-03: 50 ug via INTRAVENOUS

## 2016-02-03 MED ORDER — CLOPIDOGREL BISULFATE 75 MG PO TABS: 75.0000 mg | ORAL_TABLET | Freq: Every day | ORAL | 3 refills | Status: DC

## 2016-02-03 MED ORDER — HEPARIN SODIUM (PORCINE) 1000 UNIT/ML IJ SOLN
INTRAMUSCULAR | Status: AC
  Filled 2016-02-03: qty 1

## 2016-02-03 MED ORDER — OXYCODONE-ACETAMINOPHEN 5-325 MG PO TABS: 1.0000 | ORAL_TABLET | ORAL | Status: DC | PRN

## 2016-02-03 MED ORDER — CLOPIDOGREL BISULFATE 75 MG PO TABS
300.0000 mg | ORAL_TABLET | Freq: Once | ORAL | Status: AC
Start: 2016-02-03 — End: 2016-02-03
  Administered 2016-02-03: 300 mg via ORAL

## 2016-02-03 MED ORDER — SODIUM CHLORIDE 0.9 % IV SOLN: 1.0000 mL/kg/h | INTRAVENOUS | Status: DC

## 2016-02-03 MED ORDER — IODIXANOL 320 MG/ML IV SOLN
INTRAVENOUS | Status: DC | PRN
  Administered 2016-02-03: 150 mL

## 2016-02-03 MED ORDER — MIDAZOLAM HCL 2 MG/2ML IJ SOLN
INTRAMUSCULAR | Status: AC
  Filled 2016-02-03: qty 2

## 2016-02-03 MED ORDER — PROTAMINE SULFATE 10 MG/ML IV SOLN
INTRAVENOUS | Status: AC
  Filled 2016-02-03: qty 5

## 2016-02-03 MED ORDER — PROTAMINE SULFATE 10 MG/ML IV SOLN
INTRAVENOUS | Status: DC | PRN
  Administered 2016-02-03: 25 mg via INTRAVENOUS

## 2016-02-03 MED ORDER — LIDOCAINE HCL (PF) 1 % IJ SOLN
INTRAMUSCULAR | Status: AC
  Filled 2016-02-03: qty 30

## 2016-02-03 MED ORDER — CLOPIDOGREL BISULFATE 300 MG PO TABS
ORAL_TABLET | ORAL | Status: AC
  Filled 2016-02-03: qty 1

## 2016-02-03 MED ORDER — HEPARIN (PORCINE) IN NACL 2-0.9 UNIT/ML-% IJ SOLN
INTRAMUSCULAR | Status: AC
  Filled 2016-02-03: qty 1000

## 2016-02-03 MED ORDER — MIDAZOLAM HCL 2 MG/2ML IJ SOLN
INTRAMUSCULAR | Status: DC | PRN
  Administered 2016-02-03 (×2): 1 mg via INTRAVENOUS

## 2016-02-03 MED ORDER — HEPARIN (PORCINE) IN NACL 2-0.9 UNIT/ML-% IJ SOLN
INTRAMUSCULAR | Status: DC | PRN
  Administered 2016-02-03: 1000 mL

## 2016-02-03 MED ORDER — HEPARIN SODIUM (PORCINE) 1000 UNIT/ML IJ SOLN
INTRAMUSCULAR | Status: DC | PRN
  Administered 2016-02-03: 10000 [IU] via INTRAVENOUS

## 2016-02-03 SURGICAL SUPPLY — 27 items
BALLN IN.PACT DCB 5X150 (BALLOONS) ×9
BALLN MUSTANG 5X60X135 (BALLOONS) ×3
BALLOON MUSTANG 5X60X135 (BALLOONS) ×2 IMPLANT
CATH MUSTANG 4X200X135 (BALLOONS) ×3 IMPLANT
CATH NAVICROSS ST .035X135CM (MICROCATHETER) ×3 IMPLANT
CATH OMNI FLUSH 5F 65CM (CATHETERS) ×3 IMPLANT
CATH QUICKCROSS SUPP .035X90CM (MICROCATHETER) ×3 IMPLANT
COVER DOME SNAP 22 D (MISCELLANEOUS) ×3 IMPLANT
COVER PRB 48X5XTLSCP FOLD TPE (BAG) ×2 IMPLANT
COVER PROBE 5X48 (BAG) ×1
DCB IN.PACT 5X150 (BALLOONS) ×6 IMPLANT
DEVICE CLOSURE PERCLS PRGLD 6F (VASCULAR PRODUCTS) ×4 IMPLANT
DEVICE TORQUE .025-.038 (MISCELLANEOUS) ×3 IMPLANT
GUIDEWIRE ANGLED .035X260CM (WIRE) ×3 IMPLANT
KIT ENCORE 26 ADVANTAGE (KITS) ×3 IMPLANT
KIT MICROINTRODUCER STIFF 5F (SHEATH) ×3 IMPLANT
KIT PV (KITS) ×3 IMPLANT
PERCLOSE PROGLIDE 6F (VASCULAR PRODUCTS) ×6
SHEATH HIGHFLEX ANSEL 7FR 55CM (SHEATH) ×3 IMPLANT
SHEATH PINNACLE 5F 10CM (SHEATH) ×3 IMPLANT
SHEATH PINNACLE 7F 10CM (SHEATH) ×3 IMPLANT
STENT INNOVA 6X60X130 (Permanent Stent) ×3 IMPLANT
SYR MEDRAD MARK V 150ML (SYRINGE) ×3 IMPLANT
TRANSDUCER W/STOPCOCK (MISCELLANEOUS) ×3 IMPLANT
TRAY PV CATH (CUSTOM PROCEDURE TRAY) ×3 IMPLANT
WIRE BENTSON .035X145CM (WIRE) ×6 IMPLANT
WIRE HI TORQ VERSACORE J 260CM (WIRE) ×3 IMPLANT

## 2016-02-03 NOTE — Op Note (Signed)
    Patient name: Eugene SenegalJoseph Marrion Parker MRN: [id number] DOB: 02/07/1961 Sex: male  02/03/2016 Pre-operative Diagnosis: critical limb ischemia with R foot wound Post-operative diagnosis:  Same Surgeon:  Apolinar JunesBrandon C. Randie Heinzain, MD Procedure Performed: 1.  US guided access left common femoral artery 2.  Aortogram with RLE runoff 3.  Drug coated balloon angioplasty of Right SFA with 5mm impact with 2 balloons 4.  Stent of Right SFA proximally with 6 x 60 innova 5.  Attempted percutaneous closure left common femoral with proglide x 2  Indications:  55 year old white male with long smoking history and right foot wound. He is indicated for angiogram with possible intervention.  Findings: There was long segment SFA occlusion on the right. Following DCG angioplasty and stent placement proximal SFA there was in-line flow to the level of the ankle with the ATV and peroneal dominant. The ACT did cut out the level of the foot peroneal fed the medial plantar/PT arteries.   Procedure:  The patient was identified in the holding area and taken to room 8.  The patient was then placed supine on the table and prepped and draped in the usual sterile fashion.  A time out was called.  Ultrasound was used to evaluate the left common femoral artery.  It was patent .  A digital ultrasound image was acquired.  A micropuncture needle was used to access the left common femoral artery under ultrasound guidance.  An 018 wire was advanced without resistance and a micropuncture sheath was placed.  The 018 wire was removed and a benson wire was placed.  The micropuncture sheath was exchanged for a 5 french sheath.  An omniflush catheter was advanced over the wire to the level of L-1.  An abdominal angiogram was obtained.  Next, using the omniflush catheter and a benson wire, the aortic bifurcation was crossed and the catheter was placed into theright external iliac artery and right runoff was obtained. With the findings above we elected to  heparinize the patient placed 7 French sheath in the right iliofemoral system. Using a Glidewire never cross catheter we able to cross the lesion and reenter at the above-knee popliteal artery. We did have a perforation with a long dissection plane that we thought was intraluminally but ultimately were able to gain the right course. We confirmed intraluminal access with angiogram. We then performed balloon edge plasty with a 4 mm balloon from the level of the distal SFA to the most proximal aspect of the SFA. We follow this with drug-coated balloon angioplasty 5 x 1 50 mm with 2 separate balloons. We then placed a 6 mm x 60 mm stent at the proximal SFA takeoff from the common femoral artery. This too was postdilated with a 5 mm balloon. Completion of the right lower extremity demonstrating above findings. left runoff was performed via retrograde sheath injections. There were 2 attempted percutaneous closure is with provide device that did fail. The patient was given 25 mg of protamine sheath was pulled and pressure held until hemostasis obtained.  I was present and administered 91 minutes of sedation with fentanyl and Versed.  Contrast: 150cc  [name] C. Randie Heinzain, MD [facility] Office: [phone] Pager: [phone]

## 2016-02-03 NOTE — H&P (Signed)
     History and Physical Update  The patient was interviewed and re-examined.  The patient's previous History and Physical has been reviewed and is unchanged from office. Will plan aortogram with RLE runoff and possible intervention.  Delanee Xin C. Randie Heinzain, MD [facility] [place] Office: [phone] Pager: [phone]   02/03/2016, 11:38 AM

## 2016-02-03 NOTE — Discharge Instructions (Signed)
Angiogram, Care After °Refer to this sheet in the next few weeks. These instructions provide you with information about caring for yourself after your procedure. Your health care provider may also give you more specific instructions. Your treatment has been planned according to current medical practices, but problems sometimes occur. Call your health care provider if you have any problems or questions after your procedure. °WHAT TO EXPECT AFTER THE PROCEDURE °After your procedure, it is typical to have the following: °· Bruising at the catheter insertion site that usually fades within 1-2 weeks. °· Blood collecting in the tissue (hematoma) that may be painful to the touch. It should usually decrease in size and tenderness within 1-2 weeks. °HOME CARE INSTRUCTIONS °· Take medicines only as directed by your health care provider. °· You may shower 24-48 hours after the procedure or as directed by your health care provider. Remove the bandage (dressing) and gently wash the site with plain soap and water. Pat the area dry with a clean towel. Do not rub the site, because this may cause bleeding. °· Do not take baths, swim, or use a hot tub until your health care provider approves. °· Check your insertion site every day for redness, swelling, or drainage. °· Do not apply powder or lotion to the site. °· Do not lift over 10 lb (4.5 kg) for 5 days after your procedure or as directed by your health care provider. °· Ask your health care provider when it is okay to: °¨ Return to work or school. °¨ Resume usual physical activities or sports. °¨ Resume sexual activity. °· Do not drive home if you are discharged the same day as the procedure. Have someone else drive you. °· You may drive 24 hours after the procedure unless otherwise instructed by your health care provider. °· Do not operate machinery or power tools for 24 hours after the procedure or as directed by your health care provider. °· If your procedure was done as an  outpatient procedure, which means that you went home the same day as your procedure, a responsible adult should be with you for the first 24 hours after you arrive home. °· Keep all follow-up visits as directed by your health care provider. This is important. °SEEK MEDICAL CARE IF: °· You have a fever. °· You have chills. °· You have increased bleeding from the catheter insertion site. Hold pressure on the site.  CALL 911 °SEEK IMMEDIATE MEDICAL CARE IF: °· You have unusual pain at the catheter insertion site. °· You have redness, warmth, or swelling at the catheter insertion site. °· You have drainage (other than a small amount of blood on the dressing) from the catheter insertion site. °· The catheter insertion site is bleeding, and the bleeding does not stop after 30 minutes of holding steady pressure on the site. °· The area near or just beyond the catheter insertion site becomes pale, cool, tingly, or numb. °  °This information is not intended to replace advice given to you by your health care provider. Make sure you discuss any questions you have with your health care provider. °  °Document Released: 10/21/2004 Document Revised: 04/25/2014 Document Reviewed: 09/05/2012 °Elsevier Interactive Patient Education ©2016 [employer] ° °

## 2016-02-04 ENCOUNTER — Telehealth: Payer: Self-pay | Admitting: Vascular Surgery

## 2016-02-04 ENCOUNTER — Encounter (HOSPITAL_COMMUNITY): Payer: Self-pay | Admitting: Vascular Surgery

## 2016-02-04 NOTE — Telephone Encounter (Signed)
-----   Message from Sharee PimpleMarilyn K McChesney, RN sent at 02/03/2016  4:17 PM EDT ----- Regarding: schedule   ----- Message ----- From: Maeola HarmanBrandon Christopher Cain, MD Sent: 02/03/2016   4:07 PM To: [facility] [address]  Tedra SenegalJoseph Marrion Parker [id number] 09/15/1960  02/03/2016 Pre-operative Diagnosis: critical limb ischemia with R foot wound  Surgeon:  Eugene SalkBrandon C. Randie Heinzain, MD  Procedure Performed: 1.  US guided access left common femoral artery 2.  Aortogram with RLE runoff 3.  Drug coated balloon angioplasty of Right SFA with 5mm impact with 2 balloons 4.  Stent of Right SFA proximally with 6 x 60 innova 5.  Attempted percutaneous closure left common femoral with proglide x 2  F/u 4-6 weeks with RLE duplex and abi

## 2016-02-04 NOTE — Telephone Encounter (Signed)
LVM on home # for appt on 12/1 US & f/u  Mailed letter as well

## 2016-02-26 ENCOUNTER — Other Ambulatory Visit: Payer: Self-pay | Admitting: Cardiology

## 2016-03-18 ENCOUNTER — Encounter (HOSPITAL_COMMUNITY): Payer: Medicare Other

## 2016-03-18 ENCOUNTER — Ambulatory Visit (HOSPITAL_COMMUNITY): Payer: Medicare Other

## 2016-03-18 ENCOUNTER — Ambulatory Visit: Payer: Medicare Other | Admitting: Vascular Surgery

## 2016-03-21 ENCOUNTER — Encounter: Payer: Self-pay | Admitting: Vascular Surgery

## 2016-03-22 ENCOUNTER — Emergency Department (HOSPITAL_COMMUNITY): Payer: Medicare Other

## 2016-03-22 ENCOUNTER — Inpatient Hospital Stay (HOSPITAL_COMMUNITY)
Admission: EM | Admit: 2016-03-22 | Discharge: 2016-03-25 | DRG: 603 | Disposition: A | Discharge status: Home / Self Care | Payer: Medicare Other | Attending: Internal Medicine | Admitting: Internal Medicine

## 2016-03-22 ENCOUNTER — Encounter (HOSPITAL_COMMUNITY): Payer: Self-pay | Admitting: Emergency Medicine

## 2016-03-22 DIAGNOSIS — I739 Peripheral vascular disease, unspecified: Secondary | ICD-10-CM | POA: Diagnosis not present

## 2016-03-22 DIAGNOSIS — L03119 Cellulitis of unspecified part of limb: Secondary | ICD-10-CM | POA: Diagnosis not present

## 2016-03-22 DIAGNOSIS — L02619 Cutaneous abscess of unspecified foot: Secondary | ICD-10-CM | POA: Diagnosis not present

## 2016-03-22 DIAGNOSIS — Z7982 Long term (current) use of aspirin: Secondary | ICD-10-CM | POA: Diagnosis not present

## 2016-03-22 DIAGNOSIS — F17200 Nicotine dependence, unspecified, uncomplicated: Secondary | ICD-10-CM | POA: Diagnosis present

## 2016-03-22 DIAGNOSIS — Z95820 Peripheral vascular angioplasty status with implants and grafts: Secondary | ICD-10-CM

## 2016-03-22 DIAGNOSIS — Z79899 Other long term (current) drug therapy: Secondary | ICD-10-CM | POA: Diagnosis not present

## 2016-03-22 DIAGNOSIS — I10 Essential (primary) hypertension: Secondary | ICD-10-CM | POA: Diagnosis not present

## 2016-03-22 DIAGNOSIS — F17210 Nicotine dependence, cigarettes, uncomplicated: Secondary | ICD-10-CM | POA: Diagnosis present

## 2016-03-22 DIAGNOSIS — L03115 Cellulitis of right lower limb: Principal | ICD-10-CM | POA: Diagnosis present

## 2016-03-22 DIAGNOSIS — E785 Hyperlipidemia, unspecified: Secondary | ICD-10-CM | POA: Diagnosis present

## 2016-03-22 DIAGNOSIS — Z7902 Long term (current) use of antithrombotics/antiplatelets: Secondary | ICD-10-CM | POA: Diagnosis not present

## 2016-03-22 DIAGNOSIS — Z23 Encounter for immunization: Secondary | ICD-10-CM | POA: Diagnosis not present

## 2016-03-22 DIAGNOSIS — I7025 Atherosclerosis of native arteries of other extremities with ulceration: Secondary | ICD-10-CM | POA: Diagnosis not present

## 2016-03-22 DIAGNOSIS — L97519 Non-pressure chronic ulcer of other part of right foot with unspecified severity: Secondary | ICD-10-CM | POA: Diagnosis present

## 2016-03-22 DIAGNOSIS — I252 Old myocardial infarction: Secondary | ICD-10-CM | POA: Diagnosis not present

## 2016-03-22 DIAGNOSIS — Z791 Long term (current) use of non-steroidal anti-inflammatories (NSAID): Secondary | ICD-10-CM | POA: Diagnosis not present

## 2016-03-22 HISTORY — DX: Hyperlipidemia, unspecified: E78.5

## 2016-03-22 HISTORY — DX: Cutaneous abscess of unspecified foot: L02.619

## 2016-03-22 HISTORY — DX: Acute myocardial infarction, unspecified: I21.9

## 2016-03-22 HISTORY — DX: Cellulitis of unspecified part of limb: L03.119

## 2016-03-22 LAB — CBC WITH DIFFERENTIAL/PLATELET
Basophils Absolute: 0.1 10*3/uL (ref 0.0–0.1)
Basophils Relative: 1 %
Eosinophils Absolute: 0.6 10*3/uL (ref 0.0–0.7)
Eosinophils Relative: 7 %
HEMATOCRIT: 43.6 % (ref 39.0–52.0)
HEMOGLOBIN: 15.1 g/dL (ref 13.0–17.0)
LYMPHS ABS: 3 10*3/uL (ref 0.7–4.0)
Lymphocytes Relative: 36 %
MCH: 31.6 pg (ref 26.0–34.0)
MCHC: 34.6 g/dL (ref 30.0–36.0)
MCV: 91.2 fL (ref 78.0–100.0)
MONO ABS: 1.1 10*3/uL — AB (ref 0.1–1.0)
MONOS PCT: 13 %
NEUTROS ABS: 3.6 10*3/uL (ref 1.7–7.7)
NEUTROS PCT: 43 %
Platelets: 183 10*3/uL (ref 150–400)
RBC: 4.78 MIL/uL (ref 4.22–5.81)
RDW: 13.6 % (ref 11.5–15.5)
WBC: 8.3 10*3/uL (ref 4.0–10.5)

## 2016-03-22 LAB — COMPREHENSIVE METABOLIC PANEL
ALBUMIN: 4.5 g/dL (ref 3.5–5.0)
ALK PHOS: 67 U/L (ref 38–126)
ALT: 32 U/L (ref 17–63)
ANION GAP: 7 (ref 5–15)
AST: 36 U/L (ref 15–41)
BUN: 9 mg/dL (ref 6–20)
CALCIUM: 9.5 mg/dL (ref 8.9–10.3)
CHLORIDE: 104 mmol/L (ref 101–111)
CO2: 24 mmol/L (ref 22–32)
Creatinine, Ser: 0.99 mg/dL (ref 0.61–1.24)
GFR calc non Af Amer: >60 mL/min (ref >60)
GLUCOSE: 109 mg/dL — AB (ref 65–99)
Potassium: 3.7 mmol/L (ref 3.5–5.1)
SODIUM: 135 mmol/L (ref 135–145)
Total Bilirubin: 0.5 mg/dL (ref 0.3–1.2)
Total Protein: 8.6 g/dL — ABNORMAL HIGH (ref 6.5–8.1)

## 2016-03-22 LAB — SEDIMENTATION RATE: Sed Rate: 33 mm/hr — ABNORMAL HIGH (ref 0–16)

## 2016-03-22 LAB — MRSA PCR SCREENING: MRSA by PCR: NEGATIVE

## 2016-03-22 LAB — C-REACTIVE PROTEIN

## 2016-03-22 MED ORDER — INFLUENZA VAC SPLIT QUAD 0.5 ML IM SUSY
0.5000 mL | PREFILLED_SYRINGE | INTRAMUSCULAR | Status: AC
  Administered 2016-03-23: 0.5 mL via INTRAMUSCULAR
  Filled 2016-03-22: qty 0.5

## 2016-03-22 MED ORDER — SENNOSIDES-DOCUSATE SODIUM 8.6-50 MG PO TABS
2.0000 | ORAL_TABLET | Freq: Two times a day (BID) | ORAL | Status: DC
  Administered 2016-03-22 – 2016-03-25 (×7): 2 via ORAL
  Filled 2016-03-22 (×7): qty 2

## 2016-03-22 MED ORDER — ATORVASTATIN CALCIUM 80 MG PO TABS
80.0000 mg | ORAL_TABLET | Freq: Every day | ORAL | Status: DC
  Administered 2016-03-22 – 2016-03-25 (×4): 80 mg via ORAL
  Filled 2016-03-22 (×4): qty 1

## 2016-03-22 MED ORDER — ASPIRIN 81 MG PO CHEW
81.0000 mg | CHEWABLE_TABLET | Freq: Every day | ORAL | Status: DC
  Administered 2016-03-22 – 2016-03-25 (×4): 81 mg via ORAL
  Filled 2016-03-22 (×4): qty 1

## 2016-03-22 MED ORDER — SODIUM CHLORIDE 0.9% FLUSH
3.0000 mL | Freq: Two times a day (BID) | INTRAVENOUS | Status: DC
  Administered 2016-03-22 – 2016-03-24 (×3): 3 mL via INTRAVENOUS

## 2016-03-22 MED ORDER — METOPROLOL TARTRATE 25 MG PO TABS
25.0000 mg | ORAL_TABLET | Freq: Two times a day (BID) | ORAL | Status: DC
  Administered 2016-03-22 – 2016-03-25 (×7): 25 mg via ORAL
  Filled 2016-03-22 (×7): qty 1

## 2016-03-22 MED ORDER — OXYCODONE-ACETAMINOPHEN 10-325 MG PO TABS: 1.0000 | ORAL_TABLET | Freq: Four times a day (QID) | ORAL | Status: DC | PRN

## 2016-03-22 MED ORDER — OXYCODONE-ACETAMINOPHEN 5-325 MG PO TABS
1.0000 | ORAL_TABLET | Freq: Four times a day (QID) | ORAL | Status: DC | PRN
  Administered 2016-03-22 – 2016-03-25 (×11): 1 via ORAL
  Filled 2016-03-22 (×11): qty 1

## 2016-03-22 MED ORDER — CLOPIDOGREL BISULFATE 75 MG PO TABS
75.0000 mg | ORAL_TABLET | Freq: Every day | ORAL | Status: DC
  Administered 2016-03-22 – 2016-03-25 (×4): 75 mg via ORAL
  Filled 2016-03-22 (×4): qty 1

## 2016-03-22 MED ORDER — FENTANYL CITRATE (PF) 100 MCG/2ML IJ SOLN
50.0000 ug | Freq: Once | INTRAMUSCULAR | Status: AC
  Administered 2016-03-22: 50 ug via INTRAVENOUS
  Filled 2016-03-22: qty 2

## 2016-03-22 MED ORDER — SILVER SULFADIAZINE 1 % EX CREA
TOPICAL_CREAM | Freq: Two times a day (BID) | CUTANEOUS | Status: DC
  Administered 2016-03-23: 1 via TOPICAL
  Administered 2016-03-23 – 2016-03-24 (×4): via TOPICAL
  Filled 2016-03-22: qty 85

## 2016-03-22 MED ORDER — VANCOMYCIN HCL 10 G IV SOLR
1250.0000 mg | Freq: Two times a day (BID) | INTRAVENOUS | Status: DC
  Administered 2016-03-22 – 2016-03-25 (×6): 1250 mg via INTRAVENOUS
  Filled 2016-03-22 (×7): qty 1250

## 2016-03-22 MED ORDER — SODIUM CHLORIDE 0.9% FLUSH: 3.0000 mL | INTRAVENOUS | Status: DC | PRN

## 2016-03-22 MED ORDER — PIPERACILLIN-TAZOBACTAM 3.375 G IVPB 30 MIN
3.3750 g | Freq: Once | INTRAVENOUS | Status: AC
  Administered 2016-03-22: 3.375 g via INTRAVENOUS
  Filled 2016-03-22: qty 50

## 2016-03-22 MED ORDER — PIPERACILLIN-TAZOBACTAM 3.375 G IVPB
3.3750 g | Freq: Three times a day (TID) | INTRAVENOUS | Status: DC
  Administered 2016-03-22 – 2016-03-25 (×9): 3.375 g via INTRAVENOUS
  Filled 2016-03-22 (×11): qty 50

## 2016-03-22 MED ORDER — VANCOMYCIN HCL 10 G IV SOLR
1250.0000 mg | Freq: Two times a day (BID) | INTRAVENOUS | Status: DC
  Filled 2016-03-22 (×2): qty 1250

## 2016-03-22 MED ORDER — SODIUM CHLORIDE 0.9 % IV SOLN: 250.0000 mL | INTRAVENOUS | Status: DC | PRN

## 2016-03-22 MED ORDER — TAMSULOSIN HCL 0.4 MG PO CAPS
0.4000 mg | ORAL_CAPSULE | Freq: Every day | ORAL | Status: DC
  Administered 2016-03-22 – 2016-03-25 (×4): 0.4 mg via ORAL
  Filled 2016-03-22 (×4): qty 1

## 2016-03-22 MED ORDER — PNEUMOCOCCAL VAC POLYVALENT 25 MCG/0.5ML IJ INJ
0.5000 mL | INJECTION | INTRAMUSCULAR | Status: AC
  Administered 2016-03-23: 0.5 mL via INTRAMUSCULAR
  Filled 2016-03-22: qty 0.5

## 2016-03-22 MED ORDER — VANCOMYCIN HCL IN DEXTROSE 1-5 GM/200ML-% IV SOLN
1000.0000 mg | Freq: Once | INTRAVENOUS | Status: AC
  Administered 2016-03-22: 1000 mg via INTRAVENOUS
  Filled 2016-03-22: qty 200

## 2016-03-22 MED ORDER — LISINOPRIL 5 MG PO TABS
5.0000 mg | ORAL_TABLET | Freq: Every day | ORAL | Status: DC
  Administered 2016-03-22: 5 mg via ORAL
  Filled 2016-03-22: qty 1

## 2016-03-22 MED ORDER — OXYCODONE HCL 5 MG PO TABS
5.0000 mg | ORAL_TABLET | Freq: Four times a day (QID) | ORAL | Status: DC | PRN
  Administered 2016-03-22 – 2016-03-25 (×12): 5 mg via ORAL
  Filled 2016-03-22 (×12): qty 1

## 2016-03-22 NOTE — Progress Notes (Signed)
Pt has no diet orders, MD paged

## 2016-03-22 NOTE — ED Provider Notes (Signed)
[facility]-EMER[facility]Y DEPT Provider Note   CSN: [id number] Arrival date & time: 03/22/16  0008  Time seen 02:53 AM   History   Chief Complaint Chief Complaint  Patient presents with  . Foot Pain    HPI Eugene Parker is a 55 y.o. male.  HPI  patient states he has been going [facility] for the last 2 months for his right foot. He states he has been getting progressively more swelling over the past 2 weeks. He reports he had angioplasty of the right SFA and a stent of the right SFA and attempt to percutaneously close the left common femoral with Pro glide 2 on the 18th by Dr. Randie Heinzain, vascular surgeon. He states this was an attempt to increase the blood flow to his right foot. He states he has been told he needs to have his foot amputated. Patient states "I'm ready to get it off now". He states tonight he stepped and pus started pouring out of his foot. He denies fever but states he did have chills tonight. He has had nausea without vomiting. Pt is not clear how his foot is worse tonight compared to a week ago, but his family states it is.  Patient states Dr. Tyron RussellNickles was writing him pain medications however he ran out several days ago. Dr. Tanda RockersNichols told him he would need to get more pain medication from Dr. Randie Heinzain.  PCP Dr [facility] Dr Tanda RockersNichols [facility] Dr Randie Heinzain  Has an appointment in 3 days  Past Medical History:  Diagnosis Date  . Anxiety   . Current smoker   . DJD (degenerative joint disease), lumbosacral   . Essential hypertension   . Heart attack   . Hyperlipemia     Patient Active Problem List   Diagnosis Date Noted  . Cellulitis and abscess of foot 03/22/2016  . Hyperlipidemia 07/06/2014  . Elevated LFTs 07/06/2014  . ST elevation myocardial infarction (STEMI) of inferior wall (HCC) 07/04/2014  . Current smoker 07/04/2014  . Essential hypertension 07/04/2014    Past Surgical History:  Procedure Laterality Date  . INTRADISCAL INJECTION  Oct  2012   L5-S1  . LEFT HEART CATHETERIZATION WITH CORONARY ANGIOGRAM N/A 07/04/2014   Procedure: LEFT HEART CATHETERIZATION WITH CORONARY ANGIOGRAM;  Surgeon: Runell GessJonathan J Berry, MD;  [facility];  Service: Cardiovascular;  Laterality: N/A;  . LEG SURGERY    . PERIPHERAL VASCULAR CATHETERIZATION N/A 02/03/2016   Procedure: Abdominal Aortogram;  Surgeon: Maeola HarmanBrandon Christopher Cain, MD;  [facility];  Service: Cardiovascular;  Laterality: N/A;  . PERIPHERAL VASCULAR CATHETERIZATION N/A 02/03/2016   Procedure: Lower Extremity Angiography;  Surgeon: Maeola HarmanBrandon Christopher Cain, MD;  [facility];  Service: Cardiovascular;  Laterality: N/A;  . PERIPHERAL VASCULAR CATHETERIZATION Right 02/03/2016   Procedure: Peripheral Vascular Intervention;  Surgeon: Maeola HarmanBrandon Christopher Cain, MD;  [facility];  Service: Cardiovascular;  Laterality: Right;  SFA       Home Medications    Prior to Admission medications   Medication Sig Start Date End Date Taking? Authorizing Provider  aspirin 81 MG chewable tablet Chew 1 tablet (81 mg total) by mouth daily. 07/06/14   Brittainy Sherlynn CarbonM Simmons, PA-C  atorvastatin (LIPITOR) 80 MG tablet TAKE ONE TABLET BY MOUTH DAILY Patient taking differently: Take 80 mg by mouth daily 11/10/15   Antoine PocheJonathan F Branch, MD  clopidogrel (PLAVIX) 75 MG tablet Take 1 tablet (75 mg total) by mouth daily. 02/03/16  Maeola Harman, MD  diclofenac (VOLTAREN) 75 MG EC tablet Take 75 mg by mouth 2 (two) times daily.    Historical Provider, MD  hydrOXYzine (ATARAX/VISTARIL) 25 MG tablet Take 25 mg by mouth 4 (four) times daily.  06/02/15   Historical Provider, MD  lisinopril (PRINIVIL,ZESTRIL) 5 MG tablet TAKE ONE TABLET BY MOUTH DAILY 02/26/16   Antoine Poche, MD  metoprolol tartrate (LOPRESSOR) 25 MG tablet TAKE ONE TABLET BY MOUTH TWICE DAILY 02/26/16   Antoine Poche, MD  nitroGLYCERIN (NITROSTAT) 0.4 MG SL tablet Place 1 tablet  (0.4 mg total) under the tongue every 5 (five) minutes x 3 doses as needed for chest pain. 10/24/14   Antoine Poche, MD  oxyCODONE-acetaminophen (PERCOCET) 10-325 MG tablet Take 1 tablet by mouth every 6 (six) hours as needed for pain.  06/04/15   Historical Provider, MD  pantoprazole (PROTONIX) 40 MG tablet TAKE ONE TABLET BY MOUTH DAILY AT 6AM 02/26/16   Antoine Poche, MD  PROAIR HFA 108 [phone] Base) MCG/ACT inhaler INHALE BY MOUTH AS DIRECTED 02/26/16   Antoine Poche, MD  ticagrelor (BRILINTA) 90 MG TABS tablet Take 1 tablet (90 mg total) by mouth 2 (two) times daily. Patient not taking: Reported on 02/03/2016 03/26/15   Antoine Poche, MD  VITAMIN D HIGH POTENCY 1000 units capsule Take 1,000 Units by mouth daily. 06/02/15   Historical Provider, MD    Family History Family History  Problem Relation Age of Onset  . Heart disease Mother   . COPD Mother   . Heart disease Father     Social History Social History  Substance Use Topics  . Smoking status: Current Every Day Smoker    Packs/day: 1.00    Years: 40.00    Types: Cigarettes    Start date: 04/22/1970  . Smokeless tobacco: Never Used  . 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    Physical Exam Updated Vital Signs BP 98/62 (BP Location: Right Arm)   Pulse 84   Temp 98.4 F (36.9 C) (Oral)   Resp 18   Ht 5\' 11"  (1.803 m)   Wt 220 lb (99.8 kg)   SpO2 99%   BMI 30.68 kg/m   Vital signs normal except borderline hypotension   Physical Exam  Constitutional: He is oriented to person, place, and time. He appears well-developed and well-nourished.  Non-toxic appearance. He does not appear ill. No distress.  HENT:  Head: Normocephalic and atraumatic.  Right Ear: External ear normal.  Left Ear: External ear normal.  Nose: Nose  normal. No mucosal edema or rhinorrhea.  Mouth/Throat: Oropharynx is clear and moist and mucous membranes are normal. No dental abscesses or uvula swelling.  Eyes: Conjunctivae and EOM are normal. Pupils are equal, round, and reactive to light.  Neck: Normal range of motion and full passive range of motion without pain. Neck supple.  Cardiovascular: Normal rate, regular rhythm and normal heart sounds.  Exam reveals no gallop and no friction rub.   No murmur heard. Pulmonary/Chest: Effort normal and breath sounds normal. No respiratory distress. He has no wheezes. He has no rhonchi. He has no rales. He exhibits no tenderness and no crepitus.  Abdominal: Soft. Normal appearance and bowel sounds are normal. He exhibits no distension.  There is no tenderness. There is no rebound and no guarding.  Musculoskeletal: Normal range of motion. He exhibits no edema or tenderness.  Moves all extremities well. Pt has diffuse redness of his lower right leg and the dorsum of his foot. He has swelling of his foot, ankle and lower leg. He has a small area just proximal to the MTP of the 4th toe with a scabbed area. He has a large scabbed are on the medial aspect of his right foot with small amount purulent drainage. Pt grabs my hand and will not let me examine his leg or foot.   Neurological: He is alert and oriented to person, place, and time. He has normal strength. No cranial nerve deficit.  Skin: Skin is warm, dry and intact. No rash noted. No erythema. No pallor.  Psychiatric: He has a normal mood and affect. His speech is normal and behavior is normal. His mood appears not anxious.  Nursing note and vitals reviewed.        ED Treatments / Results  Labs (all labs ordered are listed, but only abnormal results are displayed) Results for orders placed or performed during the hospital encounter of 03/22/16  Comprehensive metabolic panel  Result Value Ref Range   Sodium 135 135 - 145 mmol/L   Potassium 3.7  3.5 - 5.1 mmol/L   Chloride 104 101 - 111 mmol/L   CO2 24 22 - 32 mmol/L   Glucose, Bld 109 (H) 65 - 99 mg/dL   BUN 9 6 - 20 mg/dL   Creatinine, Ser 4.090.99 0.61 - 1.24 mg/dL   Calcium 9.5 8.9 - 81.110.3 mg/dL   Total Protein 8.6 (H) 6.5 - 8.1 g/dL   Albumin 4.5 3.5 - 5.0 g/dL   AST 36 15 - 41 U/L   ALT 32 17 - 63 U/L   Alkaline Phosphatase 67 38 - 126 U/L   Total Bilirubin 0.5 0.3 - 1.2 mg/dL   GFR calc non Af Amer >60 >60 mL/min   GFR calc Af Amer >60 >60 mL/min   Anion gap 7 5 - 15  CBC with Differential  Result Value Ref Range   WBC 8.3 4.0 - 10.5 K/uL   RBC 4.78 4.22 - 5.81 MIL/uL   Hemoglobin 15.1 13.0 - 17.0 g/dL   HCT 91.443.6 78.239.0 - 95.652.0 %   MCV 91.2 78.0 - 100.0 fL   MCH 31.6 26.0 - 34.0 pg   MCHC 34.6 30.0 - 36.0 g/dL   RDW 21.313.6 08.611.5 - 57.815.5 %   Platelets 183 150 - 400 K/uL   Neutrophils Relative % 43 %   Neutro Abs 3.6 1.7 - 7.7 K/uL   Lymphocytes Relative 36 %   Lymphs Abs 3.0 0.7 - 4.0 K/uL   Monocytes Relative 13 %   Monocytes Absolute 1.1 (H) 0.1 - 1.0 K/uL   Eosinophils Relative 7 %   Eosinophils Absolute 0.6 0.0 - 0.7 K/uL   Basophils Relative 1 %   Basophils Absolute 0.1 0.0 - 0.1 K/uL  Sedimentation rate  Result Value Ref Range   Sed Rate 33 (H) 0 - 16 mm/hr   Laboratory interpretation all normal except mildly elevated sed rate    EKG  EKG Interpretation None       Radiology Dg Foot Complete Right  Result Date: 03/22/2016 CLINICAL DATA:  Chronic foot infection. Increased pain and drainage for 2 days. 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 No frank bony destruction is evident. No soft tissue gas is evident. No fracture or other acute bony abnormality is evident. No significant interval change is evident from 12/24/2015. Severe degenerative changes are present at the tibiotalar and talonavicular articulations. IMPRESSION: No frank bony destruction or  soft tissue gas. There are study limitations due to extensive midfoot ossifications. Electronically Signed   By: Ellery Plunk M.D.   On: 03/22/2016 04:19    Procedures Procedures (including critical care time)  Medications Ordered in ED Medications  vancomycin (VANCOCIN) IVPB 1000 mg/200 mL premix (not administered)  piperacillin-tazobactam (ZOSYN) IVPB 3.375 g (not administered)  fentaNYL (SUBLIMAZE) injection 50 mcg (50 mcg Intravenous Given 03/22/16 0331)  fentaNYL (SUBLIMAZE) injection 50 mcg (50 mcg Intravenous Given 03/22/16 0500)     Initial Impression / Assessment and Plan / ED Course  I have reviewed the triage vital signs and the nursing notes.  Pertinent labs & imaging results that were available during my care of the patient were reviewed by me and considered in my medical decision making (see chart for details).  Clinical Course    Patient was given fentanyl for pain. I got a wound culture from his draining right foot.   Review of his chart shows he was scheduled to have a vascular arterial ultrasound done on December 1 however he states it was rescheduled for the 8th.   05:10 AM Dr Onalee Hua, admit to med-surgery, start vancomycin and zosyn.   Review of the [place] database shows patient got #20 hydrocodone 10/325 on September 28, #40 oxycodone 10/325 on October 31, and #40 oxycodone 10/325 on November 16. These are all prescribed by Dr. Tanda Rockers.  Final Clinical Impressions(s) / ED Diagnoses   Final diagnoses:  Cellulitis and abscess of foot   Plan admission  Devoria Albe, MD, Concha Pyo, MD 03/22/16 [phone]

## 2016-03-22 NOTE — ED Notes (Signed)
[facility] All questions answered
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
## 2016-03-22 NOTE — ED Notes (Signed)
Pt states he will base his decision on rather or not he will stay to be admitted to the hospital upon what "pain medications the doctor will order for me", states if acceptable pain medication isnt ordered he will just "go home and hurt".

## 2016-03-22 NOTE — Progress Notes (Signed)
Pharmacy Antibiotic Note  Tedra SenegalJoseph Marrion Dorgan is a 55 y.o. male admitted on 03/22/2016 with cellulitis.  Pharmacy has been consulted for vancomcyin/zosyn dosing. Afebrile, WBC wnl. SCr 0.99 on admit, normalized CrCl~83. Wound culture pending.  Transferred from [facility]. Received 1x dose of Zosyn at 0542, 1x dose of Vancomycin 1g at 0541.  Plan: Vancomycin 1250mg  IV q12h Zosyn 3.375g IV q8h (4h infusion) Monitor clinical progress, c/s, renal function, abx plan/LOT Vancomycin trough @SS  as indicated  Height: 5\' 11"  (180.3 cm) Weight: 220 lb (99.8 kg) IBW/kg (Calculated) : 75.3  Temp (24hrs), Avg:97.9 F (36.6 C), Min:97.5 F (36.4 C), Max:98.4 F (36.9 C)   Recent Labs Lab 03/22/16 0325  WBC 8.3  CREATININE 0.99    Estimated Creatinine Clearance: 99.1 mL/min (by C-G formula based on SCr of 0.99 mg/dL).    Allergies  Allergen Reactions  . Codeine Nausea Only    Babs BertinHaley Ronda Kazmi, PharmD, BCPS Clinical Pharmacist 03/22/2016 3:11 PM

## 2016-03-22 NOTE — ED Notes (Signed)
[facility] called for transport, states they will call back for report

## 2016-03-22 NOTE — ED Triage Notes (Signed)
Pt is in process of seeing doctors to have right foot amputated.  Tonight having increased pain and drainage
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
## 2016-03-22 NOTE — Progress Notes (Signed)
Patient is admitted and transferred from [place] to [facility] for vascular surgery consult for pvd, nonhealing right foot ulcer, with cellulitis. His vital is stable, very pleasant, no leukocytosis, no fever. continue abx, I have called vascular surgery.

## 2016-03-23 ENCOUNTER — Inpatient Hospital Stay (HOSPITAL_COMMUNITY): Payer: Medicare Other

## 2016-03-23 DIAGNOSIS — I7025 Atherosclerosis of native arteries of other extremities with ulceration: Secondary | ICD-10-CM

## 2016-03-23 LAB — BASIC METABOLIC PANEL
ANION GAP: 4 — AB (ref 5–15)
BUN: 9 mg/dL (ref 6–20)
CHLORIDE: 104 mmol/L (ref 101–111)
CO2: 28 mmol/L (ref 22–32)
Calcium: 9.1 mg/dL (ref 8.9–10.3)
Creatinine, Ser: 1.1 mg/dL (ref 0.61–1.24)
GFR calc Af Amer: >60 mL/min (ref >60)
GLUCOSE: 120 mg/dL — AB (ref 65–99)
POTASSIUM: 4.4 mmol/L (ref 3.5–5.1)
Sodium: 136 mmol/L (ref 135–145)

## 2016-03-23 LAB — MAGNESIUM: Magnesium: 1.9 mg/dL (ref 1.7–2.4)

## 2016-03-23 NOTE — Progress Notes (Signed)
   Evaluated patient and reviewed ABI that has improved. At this time all that can be offered is below knee amputation that should have reasonable chance of healing. He understands but at this time declines and wants to wait until after the holidays. Will re-schedule office visit from Friday until next month.   Roddy Bellamy C. Randie Heinzain, MD [facility] [place] Office: [phone] Pager: [phone]

## 2016-03-23 NOTE — Progress Notes (Signed)
PROGRESS NOTE    Eugene Parker  ZOX:[id number] DOB: 12-11-1960 DOA: 03/22/2016 PCP: Ernestine Conrad, MD   Brief Narrative: Eugene Parker is a 55 y.o. male with medical history significant of traumatic injury to right foot at age 50, critical limb ischemia to this foot a month ago s/p rt SFA stent done at [facility] by dr cain vascular surgeon in attempt to salvage foot comes in with worsening swelling to the foot and pus draining from chronic wound.  Assessment & Plan:   Principal Problem:   Cellulitis and abscess of foot Active Problems:   Current smoker   Essential hypertension   Hyperlipidemia   PVD (peripheral vascular disease) (HCC)  Cellulitis of the foot. : Transferred to [facility] for vascular consult. Started on IV vancomycin and iv zosyn.  Vascular consulted and recommendations given . Plan to follow up with vascular on discharge.  ABI's ordered.  Pain control.  neurontin will be added.    Hypertension: well controlled.    Hyperlipidemia:  Resume statins.    PVD: Further recommendations as per vascular. Resume plavix.      DVT prophylaxis: (Lovenox) Code Status: (Full) Family Communication:none at bedside.  Disposition Plan: pending further eval.    Consultants:   Vascular consult.    Procedures:  ABI's pending.    Antimicrobials: vancomycin , zosyn on 12/5   Subjective: Pain not well cont rolled.   Objective: Vitals:   03/22/16 0914 03/22/16 2115 03/23/16 0624 03/23/16 1307  BP: 122/70 110/83 101/63 (!) 119/59  Pulse: 70 69 63 64  Resp: 18 18 17 19   Temp: 97.5 F (36.4 C) 97.6 F (36.4 C) 97.6 F (36.4 C) 97.5 F (36.4 C)  TempSrc: Oral Axillary Axillary Oral  SpO2: 100% 99% 100% 100%  Weight: 99.8 kg (220 lb)     Height: 5\' 11"  (1.803 m)       Intake/Output Summary (Last 24 hours) at 03/23/16 1317 Last data filed at 03/23/16 0939  Gross per 24 hour  Intake             1382 ml  Output                0 ml  Net              1382 ml   Filed Weights   03/22/16 0034 03/22/16 0914  Weight: 99.8 kg (220 lb) 99.8 kg (220 lb)    Examination:  General exam: Appears anxious. Respiratory system: Clear to auscultation. Respiratory effort normal. Cardiovascular system: S1 & S2 heard, RRR. No JVD, murmurs, rubs, gallops or clicks. No pedal edema. Gastrointestinal system: Abdomen is nondistended, soft and nontender. No organomegaly or masses felt. Normal bowel sounds heard. Central nervous system: Alert and oriented. No focal neurological deficits. Extremities: RLE area of eshar on the right medial / dorsum of the foot, plus erythema.  Skin: No rashes, lesions or ulcers Psychiatry: Judgement and insight appear normal. Mood & affect appropriate.     Data Reviewed: I have personally reviewed following labs and imaging studies  CBC:  Recent Labs Lab 03/22/16 0325  WBC 8.3  NEUTROABS 3.6  HGB 15.1  HCT 43.6  MCV 91.2  PLT 183   Basic Metabolic Panel:  Recent Labs Lab 03/22/16 0325 03/23/16 0640  NA 135 136  K 3.7 4.4  CL 104 104  CO2 24 28  GLUCOSE 109* 120*  BUN 9 9  CREATININE 0.99 1.10  CALCIUM 9.5 9.1  MG  --  1.9   GFR: Estimated Creatinine Clearance: 89.2 mL/min (by C-G formula based on SCr of 1.1 mg/dL). Liver Function Tests:  Recent Labs Lab 03/22/16 0325  AST 36  ALT 32  ALKPHOS 67  BILITOT 0.5  PROT 8.6*  ALBUMIN 4.5   No results for input(s): LIPASE, AMYLASE in the last 168 hours. No results for input(s): AMMONIA in the last 168 hours. Coagulation Profile: No results for input(s): INR, PROTIME in the last 168 hours. Cardiac Enzymes: No results for input(s): CKTOTAL, CKMB, CKMBINDEX, TROPONINI in the last 168 hours. BNP (last 3 results) No results for input(s): PROBNP in the last 8760 hours. HbA1C: No results for input(s): HGBA1C in the last 72 hours. CBG: No results for input(s): GLUCAP in the last 168 hours. Lipid Profile: No results for input(s): CHOL, HDL,  LDLCALC, TRIG, CHOLHDL, LDLDIRECT in the last 72 hours. Thyroid Function Tests: No results for input(s): TSH, T4TOTAL, FREET4, T3FREE, THYROIDAB in the last 72 hours. Anemia Panel: No results for input(s): VITAMINB12, FOLATE, FERRITIN, TIBC, IRON, RETICCTPCT in the last 72 hours. Sepsis Labs: No results for input(s): PROCALCITON, LATICACIDVEN in the last 168 hours.  Recent Results (from the past 240 hour(s))  Wound or Superficial Culture     Status: None (Preliminary result)   Collection Time: 03/22/16  3:00 AM  Result Value Ref Range Status   Specimen Description FOOT  Final   Special Requests Normal  Final   Gram Stain   Final    FEW WBC PRESENT,BOTH PMN AND MONONUCLEAR MODERATE GRAM POSITIVE COCCI FEW GRAM NEGATIVE RODS    Culture   Final    CULTURE REINCUBATED FOR BETTER GROWTH Performed at [facility]    Report Status PENDING  Incomplete  MRSA PCR Screening     Status: None   Collection Time: 03/22/16 12:35 PM  Result Value Ref Range Status   MRSA by PCR NEGATIVE NEGATIVE Final    Comment:        The GeneXpert MRSA Assay (FDA approved for NASAL specimens only), is one component of a comprehensive MRSA colonization surveillance program. It is not intended to diagnose MRSA infection nor to guide or monitor treatment for MRSA infections.          Radiology Studies: Dg Foot Complete Right  Result Date: 03/22/2016 CLINICAL DATA:  Chronic foot infection. Increased pain and drainage for 2 days. 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 No frank bony destruction is evident. No soft tissue gas is evident. No fracture or other acute bony abnormality is evident. No significant interval change is evident from 12/24/2015. Severe degenerative changes are present at the tibiotalar and talonavicular articulations. IMPRESSION: No frank bony destruction or soft tissue gas.  There are study limitations due to extensive midfoot ossifications. Electronically Signed   By: Ellery Plunkaniel R Mitchell M.D.   On: 03/22/2016 04:19        Scheduled Meds: . aspirin  81 mg Oral Daily  . atorvastatin  80 mg Oral Daily  . clopidogrel  75 mg Oral Daily  . metoprolol tartrate  25 mg Oral BID  . piperacillin-tazobactam (ZOSYN)  IV  3.375 g Intravenous Q8H  . senna-docusate  2 tablet Oral BID  . silver sulfADIAZINE   Topical BID  . sodium chloride flush  3 mL Intravenous Q12H  . tamsulosin  0.4 mg Oral Daily  . vancomycin  1,250 mg Intravenous Q12H   Continuous Infusions:  LOS: 1 day    Time spent: 25 minutes.     Kathlen ModyAKULA,Jamesrobert Ohanesian, MD [facility] Pager [phone]  If 7PM-7AM, please contact night-coverage [email or web address] Password [email or web address] 03/23/2016, 1:17 PM

## 2016-03-23 NOTE — Progress Notes (Signed)
Pt declined right foot wound dressing change stating that he likes to do the dressing change himself

## 2016-03-23 NOTE — Progress Notes (Signed)
VASCULAR LAB PRELIMINARY  ARTERIAL  ABI completed: Right ABI of 1.06 is suggestive of arterial flow within normal limits at rest. Left ABI of 0.83 is suggestive of mild arterial occlusive disease at rest. Right TBI of 0.58 and left TBI of 0.69 is suggestive of abnormal arterial flow at rest. The right ABI has shown improvement since the patient's last ABI study. The left ABI remains unchanged.   RIGHT    LEFT    PRESSURE WAVEFORM  PRESSURE WAVEFORM  BRACHIAL 119 Triphasic BRACHIAL 132 Triphasic  DP 136 Monophasic DP 85 Monophasic  PT 140 Monophasic PT 110 Monophasic  GREAT TOE 77 NA GREAT TOE 91 NA    RIGHT LEFT  ABI 1.06 0.83     Eugene StainGregory J Dione Parker, RVT 03/23/2016, 4:15 PM

## 2016-03-24 ENCOUNTER — Telehealth: Payer: Self-pay | Admitting: Vascular Surgery

## 2016-03-24 MED ORDER — GABAPENTIN 600 MG PO TABS
300.0000 mg | ORAL_TABLET | Freq: Three times a day (TID) | ORAL | Status: DC
  Administered 2016-03-24 – 2016-03-25 (×3): 300 mg via ORAL
  Filled 2016-03-24 (×3): qty 1

## 2016-03-24 NOTE — Telephone Encounter (Signed)
Sched lab 05/02/16 at 2:00 and MD 05/13/16 at 9:00. Lm on hm# to inform pt of changed appts.

## 2016-03-24 NOTE — Telephone Encounter (Signed)
-----   Message from Sharee PimpleMarilyn K McChesney, RN sent at 03/23/2016 10:40 PM EST ----- Regarding: cancel appt this week and reschedule   ----- Message ----- From: Maeola HarmanBrandon Christopher Cain, MD Sent: 03/23/2016   5:24 PM To: Vvs Charge Pool  Cancel appointment for this week and schedule for 4 weeks or so.   bcc

## 2016-03-24 NOTE — Progress Notes (Signed)
PROGRESS NOTE    Eugene SenegalJoseph Marrion Parker  WUJ:[id number] DOB: 06/29/1960 DOA: 03/22/2016 PCP: Ernestine ConradBLUTH, KIRK, MD   Brief Narrative: Eugene Parker is a 55 y.o. male with medical history significant of traumatic injury to right foot at age 55, critical limb ischemia to this foot a month ago s/p rt SFA stent done at [facility] by dr cain vascular surgeon in attempt to salvage foot comes in with worsening swelling to the foot and pus draining from chronic wound.  Assessment & Plan:   Principal Problem:   Cellulitis and abscess of foot Active Problems:   Current smoker   Essential hypertension   Hyperlipidemia   PVD (peripheral vascular disease) (HCC)  Cellulitis of the foot. : [facility]. Started on IV vancomycin and iv zosyn.  Vascular consulted and recommendations given . Plan  For below knee amputation by vascular surgery. Pt wanted to have it done after the christmas holidays. ABI's ordered unchanged from the previous ABI'S.  Pain control.  neurontin will be added.  Recommend to continue 24 hours of antibiotics and plan for d/c in am with oral antibiotics.    Hypertension: well controlled.    Hyperlipidemia:  Resume statins.    PVD: Further recommendations as per vascular. Resume plavix.      DVT prophylaxis: (Lovenox) Code Status: (Full) Family Communication:none at bedside.  Disposition Plan: pending further eval.    Consultants:   Vascular consult.    Procedures:  ABI's    Antimicrobials: vancomycin , zosyn on 12/5   Subjective: PATIENT reports pain is persistent.   Objective: Vitals:   03/23/16 1307 03/23/16 2219 03/24/16 0558 03/24/16 1343  BP: (!) 119/59 138/74 124/77 127/68  Pulse: 64 73 78 68  Resp: 19 18 18 18   Temp: 97.5 F (36.4 C) 98 F (36.7 C) 98.3 F (36.8 C) 98.3 F (36.8 C)  TempSrc: Oral Oral Oral Oral  SpO2: 100% 100% 100% 100%  Weight:      Height:        Intake/Output Summary (Last 24 hours)  at 03/24/16 1743 Last data filed at 03/24/16 1343  Gross per 24 hour  Intake              840 ml  Output             1700 ml  Net             -860 ml   Filed Weights   03/22/16 0034 03/22/16 0914  Weight: 99.8 kg (220 lb) 99.8 kg (220 lb)    Examination:  General exam: Appears anxious. Respiratory system: Clear to auscultation. Respiratory effort normal. Cardiovascular system: S1 & S2 heard, RRR. No JVD, murmurs, rubs, gallops or clicks. No pedal edema. Gastrointestinal system: Abdomen is nondistended, soft and nontender. No organomegaly or masses felt. Normal bowel sounds heard. Central nervous system: Alert and oriented. No focal neurological deficits. Extremities: RLE area of eshar on the right medial / dorsum of the foot, plus erythema.  Skin: No rashes, lesions or ulcers Psychiatry: Judgement and insight appear normal. Mood & affect appropriate.     Data Reviewed: I have personally reviewed following labs and imaging studies  CBC:  Recent Labs Lab 03/22/16 0325  WBC 8.3  NEUTROABS 3.6  HGB 15.1  HCT 43.6  MCV 91.2  PLT 183   Basic Metabolic Panel:  Recent Labs Lab 03/22/16 0325 03/23/16 0640  NA 135 136  K 3.7 4.4  CL 104 104  CO2 24 28  GLUCOSE 109* 120*  BUN 9 9  CREATININE 0.99 1.10  CALCIUM 9.5 9.1  MG  --  1.9   GFR: Estimated Creatinine Clearance: 89.2 mL/min (by C-G formula based on SCr of 1.1 mg/dL). Liver Function Tests:  Recent Labs Lab 03/22/16 0325  AST 36  ALT 32  ALKPHOS 67  BILITOT 0.5  PROT 8.6*  ALBUMIN 4.5   No results for input(s): LIPASE, AMYLASE in the last 168 hours. No results for input(s): AMMONIA in the last 168 hours. Coagulation Profile: No results for input(s): INR, PROTIME in the last 168 hours. Cardiac Enzymes: No results for input(s): CKTOTAL, CKMB, CKMBINDEX, TROPONINI in the last 168 hours. BNP (last 3 results) No results for input(s): PROBNP in the last 8760 hours. HbA1C: No results for input(s):  HGBA1C in the last 72 hours. CBG: No results for input(s): GLUCAP in the last 168 hours. Lipid Profile: No results for input(s): CHOL, HDL, LDLCALC, TRIG, CHOLHDL, LDLDIRECT in the last 72 hours. Thyroid Function Tests: No results for input(s): TSH, T4TOTAL, FREET4, T3FREE, THYROIDAB in the last 72 hours. Anemia Panel: No results for input(s): VITAMINB12, FOLATE, FERRITIN, TIBC, IRON, RETICCTPCT in the last 72 hours. Sepsis Labs: No results for input(s): PROCALCITON, LATICACIDVEN in the last 168 hours.  Recent Results (from the past 240 hour(s))  Wound or Superficial Culture     Status: None (Preliminary result)   Collection Time: 03/22/16  3:00 AM  Result Value Ref Range Status   Specimen Description FOOT  Final   Special Requests Normal  Final   Gram Stain   Final    FEW WBC PRESENT,BOTH PMN AND MONONUCLEAR MODERATE GRAM POSITIVE COCCI FEW GRAM NEGATIVE RODS    Culture   Final    CULTURE REINCUBATED FOR BETTER GROWTH Performed at [facility]    Report Status PENDING  Incomplete  MRSA PCR Screening     Status: None   Collection Time: 03/22/16 12:35 PM  Result Value Ref Range Status   MRSA by PCR NEGATIVE NEGATIVE Final    Comment:        The GeneXpert MRSA Assay (FDA approved for NASAL specimens only), is one component of a comprehensive MRSA colonization surveillance program. It is not intended to diagnose MRSA infection nor to guide or monitor treatment for MRSA infections.          Radiology Studies: No results found.      Scheduled Meds: . aspirin  81 mg Oral Daily  . atorvastatin  80 mg Oral Daily  . clopidogrel  75 mg Oral Daily  . gabapentin  300 mg Oral TID  . metoprolol tartrate  25 mg Oral BID  . piperacillin-tazobactam (ZOSYN)  IV  3.375 g Intravenous Q8H  . senna-docusate  2 tablet Oral BID  . silver sulfADIAZINE   Topical BID  . sodium chloride flush  3 mL Intravenous Q12H  . tamsulosin  0.4 mg Oral Daily  . vancomycin  1,250 mg  Intravenous Q12H   Continuous Infusions:   LOS: 2 days    Time spent: 25 minutes.     Kathlen ModyAKULA,Daris Aristizabal, MD [facility] Pager [phone]  If 7PM-7AM, please contact night-coverage [email or web address] Password [email or web address] 03/24/2016, 5:43 PM

## 2016-03-24 NOTE — Progress Notes (Signed)
Patient has made decision to have surgery after the holidays and wanted me to let MD know so he could possibly go home today/tomorrow. I called MD Blake DivineAkula and she said she would come see him.

## 2016-03-24 NOTE — Consult Note (Addendum)
WOC consult requested for right foot wound.  Pt had vascular consult performed on 12/5 and 12/6; please refer to their progress notes, which indicate:  " all that can be offered is below knee amputation that should have reasonable chance of healing. He understands but at this time declines and wants to wait until after the holidays. Will re-schedule office visit from Friday until next month."   Topical treatment will be minimally effective and Silvadene has already been ordered for dressing changes BID.  No further role for WOC nurse at this time. Please re-consult if further assistance is needed.  Thank-you,  Cammie Mcgeeawn Kenndra Morris MSN, RN, CWOCN, SagarWCN-AP, CNS [phone]

## 2016-03-25 ENCOUNTER — Ambulatory Visit: Payer: Medicare Other | Admitting: Vascular Surgery

## 2016-03-25 ENCOUNTER — Encounter (HOSPITAL_COMMUNITY): Payer: Medicare Other

## 2016-03-25 DIAGNOSIS — L03119 Cellulitis of unspecified part of limb: Secondary | ICD-10-CM

## 2016-03-25 DIAGNOSIS — L02619 Cutaneous abscess of unspecified foot: Secondary | ICD-10-CM

## 2016-03-25 LAB — AEROBIC CULTURE W GRAM STAIN (SUPERFICIAL SPECIMEN): Special Requests: NORMAL

## 2016-03-25 LAB — AEROBIC CULTURE  (SUPERFICIAL SPECIMEN)

## 2016-03-25 MED ORDER — GABAPENTIN 600 MG PO TABS: 300.0000 mg | ORAL_TABLET | Freq: Three times a day (TID) | ORAL | 0 refills | Status: DC

## 2016-03-25 MED ORDER — SENNOSIDES-DOCUSATE SODIUM 8.6-50 MG PO TABS: 2.0000 | ORAL_TABLET | Freq: Two times a day (BID) | ORAL | 0 refills | Status: AC

## 2016-03-25 MED ORDER — TAMSULOSIN HCL 0.4 MG PO CAPS: 0.4000 mg | ORAL_CAPSULE | Freq: Every day | ORAL | 0 refills | Status: DC

## 2016-03-25 MED ORDER — OXYCODONE-ACETAMINOPHEN 10-325 MG PO TABS: 1.0000 | ORAL_TABLET | Freq: Four times a day (QID) | ORAL | 0 refills | Status: DC | PRN

## 2016-03-25 MED ORDER — CEPHALEXIN 500 MG PO CAPS: 500.0000 mg | ORAL_CAPSULE | Freq: Two times a day (BID) | ORAL | 0 refills | Status: AC

## 2016-03-25 MED ORDER — SILVER SULFADIAZINE 1 % EX CREA: TOPICAL_CREAM | Freq: Two times a day (BID) | CUTANEOUS | 0 refills | Status: DC

## 2016-03-25 NOTE — Care Management Note (Signed)
Case Management Note  Patient Details  Name: Eugene SenegalJoseph Marrion Parker MRN: [id number] Date of Birth: 10/30/1960  Subjective/Objective:                    Action/Plan: Pt discharging home with self care. Pt to return for surgery after the holidays. No further needs per CM.   Expected Discharge Date:                  Expected Discharge Plan:  Home/Self Care  In-House Referral:     Discharge planning Services     Post Acute Care Choice:    Choice offered to:     DME Arranged:    DME Agency:     HH Arranged:    HH Agency:     Status of Service:  Completed, signed off  If discussed at [employer] Length of Stay Meetings, dates discussed:    Additional Comments:  Kermit BaloKelli F Havier Deeb, RN 03/25/2016, 10:23 AM

## 2016-03-25 NOTE — Discharge Summary (Signed)
Discharge Summary  Eugene SenegalJoseph Marrion Parker ZOX:[id number] DOB: 07/24/1960  PCP: Ernestine ConradBLUTH, KIRK, MD  Admit date: 03/22/2016 Discharge date: 03/25/2016   Recommendations for Outpatient Follow-up:  1. Dr. [facility] 3 weeks (right after Christmas) 2. PCP 2 weeks   Discharge Diagnoses:  Active Hospital Problems   Diagnosis Date Noted  . Cellulitis and abscess of foot 03/22/2016  . PVD (peripheral vascular disease) (HCC) 03/22/2016  . Hyperlipidemia 07/06/2014  . Essential hypertension 07/04/2014  . Current smoker 07/04/2014    Resolved Hospital Problems   Diagnosis Date Noted Date Resolved  No resolved problems to display.    Discharge Condition: Stable   Diet recommendation: Regular   Vitals:   03/24/16 2136 03/25/16 0511  BP: 130/77 (!) 143/69  Pulse: 70 75  Resp: 18 18  Temp: 98.3 F (36.8 C) 98.4 F (36.9 C)    HPI and Brief Hospital Course:  Eugene LevelJoseph Marrion Jamesis a 55 y.o.malewith medical history significant of traumatic injury to right foot at age 55, critical limb ischemia to this foot a month ago s/p rt SFA stent done at [facility] by dr cain vascular surgeon in attempt to salvage foot comes in with worsening swelling to the foot and pus draining from chronic wound. He was placed on IV abx and has improved. Was seen by vascular and recommend that despite improved ABI will need amputation. Patient requests this be done after Christmas, as he has been afebrile, normal labs, and improving this is the plan.   I had a long and detailed discussion with patient today regarding signs/symptoms for which he should seek medical attention. He expressed understanding and realizes risk of worsening infection if it goes untreated.  He was treated in house with IV vanco/zosyn. No signs of active infection today. MRSA screen was negative so will treat for superficial cellulitis with PO Keflex for 10 more days.    Discharge details, plan of care and follow up instructions were discussed  with patient and any available family or care providers. Patient and family are in agreement with discharge from the hospital today and all questions were answered to their satisfaction.  Procedures:  None   Consultations:  Vascular   Discharge Exam: BP (!) 143/69 (BP Location: Right Arm)   Pulse 75   Temp 98.4 F (36.9 C) (Oral)   Resp 18   Ht 5\' 11"  (1.803 m)   Wt 99.8 kg (220 lb)   SpO2 99%   BMI 30.68 kg/m  General:  Alert, oriented, calm, in no acute distress  Eyes: EOMI, clear sclerea Neck: supple, no masses, trachea mildline  Cardiovascular: RRR, no murmurs or rubs, no peripheral edema  Respiratory: clear to auscultation bilaterally, no wheezes, no crackles  Abdomen: soft, nontender, nondistended, normal bowel tones heard  Skin: dry, no rashes  Musculoskeletal: no joint effusions, normal range of motion, RLE is wrapped, there is only minimal redness above the right foot bandage, and some pitting edema. No drainage, areas of fluctuance. Psychiatric: appropriate affect, normal speech  Neurologic: extraocular muscles intact, clear speech, moving all extremities with intact sensorium    Discharge Instructions You were cared for by a hospitalist during your hospital stay. If you have any questions about your discharge medications or the care you received while you were in the hospital after you are discharged, you can call the unit and asked to speak with the hospitalist on call if the hospitalist that took care of you is not available. Once you are discharged, your primary care  physician will handle any further medical issues. Please note that NO REFILLS for any discharge medications will be authorized once you are discharged, as it is imperative that you return to your primary care physician (or establish a relationship with a primary care physician if you do not have one) for your aftercare needs so that they can reassess your need for medications and monitor your lab  values.  Discharge Instructions    Diet - low sodium heart healthy    Complete by:  As directed    Increase activity slowly    Complete by:  As directed        Medication List    STOP taking these medications   lisinopril 5 MG tablet Commonly known as:  PRINIVIL,ZESTRIL     TAKE these medications   aspirin 81 MG chewable tablet Chew 1 tablet (81 mg total) by mouth daily.   atorvastatin 80 MG tablet Commonly known as:  LIPITOR TAKE ONE TABLET BY MOUTH DAILY What changed:  See the new instructions.   cephALEXin 500 MG capsule Commonly known as:  KEFLEX Take 1 capsule (500 mg total) by mouth 2 (two) times daily.   clopidogrel 75 MG tablet Commonly known as:  PLAVIX Take 1 tablet (75 mg total) by mouth daily.   diclofenac 75 MG EC tablet Commonly known as:  VOLTAREN Take 75 mg by mouth 2 (two) times daily.   gabapentin 600 MG tablet Commonly known as:  NEURONTIN Take 0.5 tablets (300 mg total) by mouth 3 (three) times daily.   hydrOXYzine 25 MG tablet Commonly known as:  ATARAX/VISTARIL Take 25 mg by mouth 4 (four) times daily as needed for anxiety or itching.   metoprolol tartrate 25 MG tablet Commonly known as:  LOPRESSOR TAKE ONE TABLET BY MOUTH TWICE DAILY What changed:  See the new instructions.   nitroGLYCERIN 0.4 MG SL tablet Commonly known as:  NITROSTAT Place 1 tablet (0.4 mg total) under the tongue every 5 (five) minutes x 3 doses as needed for chest pain.   oxyCODONE-acetaminophen 10-325 MG tablet Commonly known as:  PERCOCET Take 1 tablet by mouth every 6 (six) hours as needed for pain.   pantoprazole 40 MG tablet Commonly known as:  PROTONIX TAKE ONE TABLET BY MOUTH DAILY AT 6AM What changed:  See the new instructions.   PROAIR HFA 108 (90 Base) MCG/ACT inhaler Generic drug:  albuterol INHALE BY MOUTH AS DIRECTED What changed:  See the new instructions.   senna-docusate 8.6-50 MG tablet Commonly known as:  Senokot-S Take 2 tablets by  mouth 2 (two) times daily.   silver sulfADIAZINE 1 % cream Commonly known as:  SILVADENE Apply topically 2 (two) times daily.   tamsulosin 0.4 MG Caps capsule Commonly known as:  FLOMAX Take 1 capsule (0.4 mg total) by mouth daily.   VITAMIN D HIGH POTENCY 1000 units capsule Generic drug:  Cholecalciferol Take 1,000 Units by mouth daily.      Allergies  Allergen Reactions  . Codeine Nausea Only   Follow-up Information    Lemar Livings, MD Follow up in 3 week(s).   Specialties:  Vascular Surgery, Cardiology Contact information: [address] [place] [address] [phone]        Ernestine Conrad, MD Follow up in 2 week(s).   Specialty:  Family Medicine Contact information: [address] [place] [address] [phone]            The results of significant diagnostics from this hospitalization (including  imaging, microbiology, ancillary and laboratory) are listed below for reference.    Significant Diagnostic Studies: Dg Foot Complete Right  Result Date: 03/22/2016 CLINICAL DATA:  Chronic foot infection. Increased pain and drainage for 2 days. 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 No frank bony destruction is evident. No soft tissue gas is evident. No fracture or other acute bony abnormality is evident. No significant interval change is evident from 12/24/2015. Severe degenerative changes are present at the tibiotalar and talonavicular articulations. IMPRESSION: No frank bony destruction or soft tissue gas. There are study limitations due to extensive midfoot ossifications. Electronically Signed   By: Ellery Plunkaniel R Mitchell M.D.   On: 03/22/2016 04:19    Microbiology: Recent Results (from the past 240 hour(s))  Wound or Superficial Culture     Status: None (Preliminary result)   Collection Time: 03/22/16  3:00 AM  Result Value Ref Range Status   Specimen  Description FOOT  Final   Special Requests Normal  Final   Gram Stain   Final    FEW WBC PRESENT,BOTH PMN AND MONONUCLEAR MODERATE GRAM POSITIVE COCCI FEW GRAM NEGATIVE RODS    Culture   Final    CULTURE REINCUBATED FOR BETTER GROWTH [facility]    Report Status PENDING  Incomplete  MRSA PCR Screening     Status: None   Collection Time: 03/22/16 12:35 PM  Result Value Ref Range Status   MRSA by PCR NEGATIVE NEGATIVE Final    Comment:        The GeneXpert MRSA Assay (FDA approved for NASAL specimens only), is one component of a comprehensive MRSA colonization surveillance program. It is not intended to diagnose MRSA infection nor to guide or monitor treatment for MRSA infections.      Labs: Basic Metabolic Panel:  Recent Labs Lab 03/22/16 0325 03/23/16 0640  NA 135 136  K 3.7 4.4  CL 104 104  CO2 24 28  GLUCOSE 109* 120*  BUN 9 9  CREATININE 0.99 1.10  CALCIUM 9.5 9.1  MG  --  1.9   Liver Function Tests:  Recent Labs Lab 03/22/16 0325  AST 36  ALT 32  ALKPHOS 67  BILITOT 0.5  PROT 8.6*  ALBUMIN 4.5   No results for input(s): LIPASE, AMYLASE in the last 168 hours. No results for input(s): AMMONIA in the last 168 hours. CBC:  Recent Labs Lab 03/22/16 0325  WBC 8.3  NEUTROABS 3.6  HGB 15.1  HCT 43.6  MCV 91.2  PLT 183   Cardiac Enzymes: No results for input(s): CKTOTAL, CKMB, CKMBINDEX, TROPONINI in the last 168 hours. BNP: BNP (last 3 results) No results for input(s): BNP in the last 8760 hours.  ProBNP (last 3 results) No results for input(s): PROBNP in the last 8760 hours.  CBG: No results for input(s): GLUCAP in the last 168 hours.  Time spent: 32 minutes were spent in preparing this discharge including medication reconciliation, counseling, and coordination of care.  Signed:  Mir Vergie LivingMohammed Ikramullah, MD  [facility] 03/25/2016, 8:43 AM

## 2016-03-25 NOTE — Care Management Important Message (Signed)
Important Message  Patient Details  Name: Eugene Parker MRN: [id number] Date of Birth: 11/09/1960   Medicare Important Message Given:  Yes    Jadee Golebiewski Stefan ChurchBratton 03/25/2016, 12:55 PM

## 2016-03-25 NOTE — Progress Notes (Signed)
Eugene SenegalJoseph Marrion Parker to be D/C'd  per MD order. Discussed with the patient and all questions fully answered.  VSS, Skin clean, dry and intact without evidence of skin break down, no evidence of skin tears noted.  IV catheter discontinued intact. Site without signs and symptoms of complications. Dressing and pressure applied.  An After Visit Summary was printed and given to the patient. Patient received prescription.  D/c education completed with patient/family including follow up instructions, medication list, d/c activities limitations if indicated, with other d/c instructions as indicated by MD - patient able to verbalize understanding, all questions fully answered.   Patient instructed to return to ED, call 911, or call MD for any changes in condition.   Patient to be escorted via WC, and D/C home via private auto.   Dan HumphreysWalker is arranged to be delivered to house via case Development worker, international aidmanager Kelly.

## 2016-04-15 ENCOUNTER — Encounter (HOSPITAL_COMMUNITY): Payer: Medicare Other

## 2016-04-15 ENCOUNTER — Ambulatory Visit: Payer: Medicare Other | Admitting: Vascular Surgery

## 2016-04-25 ENCOUNTER — Encounter: Payer: Self-pay | Admitting: Cardiology

## 2016-04-25 ENCOUNTER — Ambulatory Visit: Payer: Medicare Other | Admitting: Cardiology

## 2016-04-25 NOTE — Progress Notes (Deleted)
Clinical Summary Mr. Usman is a 56 y.o.male seen today for follow up of the following medical problems.   1. CAD - hx of inferior STEMI 06/2014, received DES to LCX.  - echo 11/2014 LVEF 55-60%  - notes some chest pain after eating or drinking, otherwise no symptoms. No SOB or DOE.   2. Preop - being considered for left shoulder surgery  3. COPD - abnormal PFTs 03/2015,. Started on albuterol prn with improved symptoms. No recent symptoms.  4. Foot infection - recent admission 03/2016. Considering amputation later this month    Past Medical History:  Diagnosis Date  . Anxiety   . Cellulitis and abscess of foot 03/22/2016  . Current smoker   . DJD (degenerative joint disease), lumbosacral   . Essential hypertension   . Heart attack   . Hyperlipemia      Allergies  Allergen Reactions  . Codeine Nausea Only     Current Outpatient Prescriptions  Medication Sig Dispense Refill  . aspirin 81 MG chewable tablet Chew 1 tablet (81 mg total) by mouth daily.    Marland Kitchen atorvastatin (LIPITOR) 80 MG tablet TAKE ONE TABLET BY MOUTH DAILY (Patient taking differently: Take 80 mg by mouth daily) 30 tablet 3  . clopidogrel (PLAVIX) 75 MG tablet Take 1 tablet (75 mg total) by mouth daily. 30 tablet 3  . diclofenac (VOLTAREN) 75 MG EC tablet Take 75 mg by mouth 2 (two) times daily.    Marland Kitchen gabapentin (NEURONTIN) 600 MG tablet Take 0.5 tablets (300 mg total) by mouth 3 (three) times daily. 45 tablet 0  . hydrOXYzine (ATARAX/VISTARIL) 25 MG tablet Take 25 mg by mouth 4 (four) times daily as needed for anxiety or itching.   3  . metoprolol tartrate (LOPRESSOR) 25 MG tablet TAKE ONE TABLET BY MOUTH TWICE DAILY (Patient taking differently: TAKE 25 MG BY MOUTH TWICE DAILY) 60 tablet 2  . nitroGLYCERIN (NITROSTAT) 0.4 MG SL tablet Place 1 tablet (0.4 mg total) under the tongue every 5 (five) minutes x 3 doses as needed for chest pain. 25 tablet 2  . oxyCODONE-acetaminophen (PERCOCET) 10-325 MG  tablet Take 1 tablet by mouth every 6 (six) hours as needed for pain. 30 tablet 0  . pantoprazole (PROTONIX) 40 MG tablet TAKE ONE TABLET BY MOUTH DAILY AT 6AM (Patient taking differently: TAKE 40 MG BY MOUTH DAILY AT 6AM) 30 tablet 2  . PROAIR HFA 108 (90 Base) MCG/ACT inhaler INHALE BY MOUTH AS DIRECTED (Patient taking differently: Inhale 2 puffs by mouth every 6 hours as needed for shortness of breath) 8.5 g 1  . silver sulfADIAZINE (SILVADENE) 1 % cream Apply topically 2 (two) times daily. 50 g 0  . tamsulosin (FLOMAX) 0.4 MG CAPS capsule Take 1 capsule (0.4 mg total) by mouth daily. 30 capsule 0  . VITAMIN D HIGH POTENCY 1000 units capsule Take 1,000 Units by mouth daily.  11   No current facility-administered medications for this visit.      Past Surgical History:  Procedure Laterality Date  . INTRADISCAL INJECTION  Oct 2012   L5-S1  . LEFT HEART CATHETERIZATION WITH CORONARY ANGIOGRAM N/A 07/04/2014   Procedure: LEFT HEART CATHETERIZATION WITH CORONARY ANGIOGRAM;  Surgeon: Lorretta Harp, MD;  Location: [facility] CATH LAB;  Service: Cardiovascular;  Laterality: N/A;  . LEG SURGERY    . PERIPHERAL VASCULAR CATHETERIZATION N/A 02/03/2016   Procedure: Abdominal Aortogram;  Surgeon: Waynetta Sandy, MD;  Location: [facility] CV LAB;  Service:  Cardiovascular;  Laterality: N/A;  . PERIPHERAL VASCULAR CATHETERIZATION N/A 02/03/2016   Procedure: Lower Extremity Angiography;  Surgeon: Waynetta Sandy, MD;  Location: [facility] CV LAB;  Service: Cardiovascular;  Laterality: N/A;  . PERIPHERAL VASCULAR CATHETERIZATION Right 02/03/2016   Procedure: Peripheral Vascular Intervention;  Surgeon: Waynetta Sandy, MD;  Location: [facility] CV LAB;  Service: Cardiovascular;  Laterality: Right;  SFA     Allergies  Allergen Reactions  . Codeine Nausea Only      Family History  Problem Relation Age of Onset  . Heart disease Mother   . COPD Mother   . Heart disease  Father      Social History Mr. Yepes reports that he has been smoking Cigarettes.  He started smoking about 46 years ago. He has a 40.00 pack-year smoking history. He has never used smokeless tobacco. Mr. Heathman reports that he drinks alcohol.   Review of Systems CONSTITUTIONAL: No weight loss, fever, chills, weakness or fatigue.  HEENT: Eyes: No visual loss, blurred vision, double vision or yellow sclerae.No hearing loss, sneezing, congestion, runny nose or sore throat.  SKIN: No rash or itching.  CARDIOVASCULAR:  RESPIRATORY: No shortness of breath, cough or sputum.  GASTROINTESTINAL: No anorexia, nausea, vomiting or diarrhea. No abdominal pain or blood.  GENITOURINARY: No burning on urination, no polyuria NEUROLOGICAL: No headache, dizziness, syncope, paralysis, ataxia, numbness or tingling in the extremities. No change in bowel or bladder control.  MUSCULOSKELETAL: No muscle, back pain, joint pain or stiffness.  LYMPHATICS: No enlarged nodes. No history of splenectomy.  PSYCHIATRIC: No history of depression or anxiety.  ENDOCRINOLOGIC: No reports of sweating, cold or heat intolerance. No polyuria or polydipsia.  Marland Kitchen   Physical Examination There were no vitals filed for this visit. There were no vitals filed for this visit.  Gen: resting comfortably, no acute distress HEENT: no scleral icterus, pupils equal round and reactive, no palptable cervical adenopathy,  CV Resp: Clear to auscultation bilaterally GI: abdomen is soft, non-tender, non-distended, normal bowel sounds, no hepatosplenomegaly MSK: extremities are warm, no edema.  Skin: warm, no rash Neuro:  no focal deficits Psych: appropriate affect   Diagnostic Studies 06/2014 Cath HEMODYNAMICS:   AO SYSTOLIC/AO DIASTOLIC: 725/36  LV SYSTOLIC/LV DIASTOLIC: 644/03  ANGIOGRAPHIC RESULTS:   1. Left main; normal  2. LAD; minor irregularities 3. Left circumflex; codominant with occluded mid AV groove after OM 3.   4. Right coronary artery; codominant with 40-50% segmental mid 7. Left ventriculography; RAO left ventriculogram was performed using  25 mL of Visipaque dye at 12 mL/second. The overall LVEF estimated  45-50 % With wall motion abnormalities moderate inferoapical and posterolateral hypokinesia  IMPRESSION:Mr. Carcione has been occluded codominant circumflex with otherwise noncritical CAD. We will proceed with direct intervention using Angiomax, Brilenta, and drug eluting stent.  Procedure description: The patient received Angiomax bolus and infusion with an ACT of 435. A total of 185 mL of contrast was administered to the patient. Using a 6 [place] XB 3.5 similar guide catheter along with a 1/190 cm long pro-water guidewire and a 2 mm x 12 mm Euphora balloon the lesion was crossed and angioplasty performed. The door to balloon time was 26 minutes. Following this A 2.75 mm x 23 mm long Xience drug-eluting stent was then carefully positioned and deployed at 12 atm and postdilated with a 3 mm x 20 mm long [place] Euphora balloon at 15 atm (3.1 mm) resulting in reduction with total occlusion to 0% residual with  TIMI-3 flow and no dissection. The patient tolerated the procedure well. There are no hemodynamic or electrocardiographic sequela. The guidewire and catheter were removed and the sheath was secured.   Final impression: successful PCI and stenting of a occluded mid codominant circumflex in the setting of an acute inferolateral myocardial infarction with a door to balloon time 26 minutes using Angiomax, Proventil and drug-eluting stent. The patient had noncritical disease otherwise with preserved LV function. He'll be treated with usual medications including beta blocker, statin drug and ACE inhibitor. His sheath will be removed in several hours and pressure held. He will be fast tracked with anticipated discharge in 48-72 hours. He left the lab in stable condition.   11/2014 echo Study  Conclusions  - Left ventricle: The cavity size was normal. Wall thickness was increased in a pattern of mild LVH. Systolic function was normal. The estimated ejection fraction was in the range of 55% to 60%. Images were inadequate for LV wall motion assessment. However, no gross regional variation on available images. Diastolic dysfunction, grade indeterminate. - Aortic valve: Mildly to moderately calcified annulus. Mildly calcified leaflets. - Mitral valve: There was trivial regurgitation.  03/2015 PFTs: technically difficult due to poor patient cooperation. Was some evidence of obstruction.      Assessment and Plan  1. CAD - no current symptoms - he will stop his brillinta later this month as he have will completed one year  2. Preoperative evaluation - ok for surgery planned in April from cardiac standpoint      Arnoldo Lenis, M.D.

## 2016-05-02 ENCOUNTER — Inpatient Hospital Stay (HOSPITAL_COMMUNITY): Admit: 2016-05-02 | Payer: Medicare Other

## 2016-05-02 ENCOUNTER — Encounter (HOSPITAL_COMMUNITY): Payer: Medicare Other

## 2016-05-10 ENCOUNTER — Encounter: Payer: Self-pay | Admitting: Vascular Surgery

## 2016-05-13 ENCOUNTER — Ambulatory Visit: Payer: Medicare Other | Admitting: Vascular Surgery

## 2016-06-07 ENCOUNTER — Telehealth: Payer: Self-pay

## 2016-06-07 DIAGNOSIS — I70235 Atherosclerosis of native arteries of right leg with ulceration of other part of foot: Secondary | ICD-10-CM

## 2016-06-07 DIAGNOSIS — Z959 Presence of cardiac and vascular implant and graft, unspecified: Secondary | ICD-10-CM

## 2016-06-07 NOTE — Telephone Encounter (Signed)
rec'd call from nurse at [facility].  Reported the pt. has been followed by Dr. Tanda RockersNichols for a nonhealing wound on right dorsal foot.  Stated the wound has gotten larger, and is no longer responding to the wound tx.  Reported the right foot is red/purple. Stated that Dr. Tanda RockersNichols would like him evaluated sooner than later.  Reported he is in a lot of pain.  Per nurse, pt's wife recently passed away, and he had canceled / no showed multiple previous appts., due to her poor health and providing care to her. Advised nurse will have a Scheduler call pt. with appt. Information.

## 2016-06-09 NOTE — Telephone Encounter (Signed)
Scheduled for 3/9 @12 :30pm Left him a message

## 2016-06-14 ENCOUNTER — Other Ambulatory Visit: Payer: Self-pay | Admitting: Cardiology

## 2016-06-17 ENCOUNTER — Encounter: Payer: Self-pay | Admitting: Vascular Surgery

## 2016-06-24 ENCOUNTER — Other Ambulatory Visit: Payer: Self-pay

## 2016-06-24 ENCOUNTER — Ambulatory Visit (HOSPITAL_COMMUNITY)
Admission: RE | Admit: 2016-06-24 | Discharge: 2016-06-24 | Disposition: A | Discharge status: Home / Self Care | Payer: Medicare Other | Source: Ambulatory Visit | Attending: Vascular Surgery | Admitting: Vascular Surgery

## 2016-06-24 ENCOUNTER — Ambulatory Visit (INDEPENDENT_AMBULATORY_CARE_PROVIDER_SITE_OTHER): Payer: Medicare Other | Admitting: Vascular Surgery

## 2016-06-24 ENCOUNTER — Encounter: Payer: Self-pay | Admitting: Vascular Surgery

## 2016-06-24 VITALS — BP 128/78 | HR 61 | Temp 99.9°F | Resp 18 | Ht 71.0 in | Wt 201.4 lb

## 2016-06-24 DIAGNOSIS — I70235 Atherosclerosis of native arteries of right leg with ulceration of other part of foot: Secondary | ICD-10-CM | POA: Insufficient documentation

## 2016-06-24 DIAGNOSIS — Z959 Presence of cardiac and vascular implant and graft, unspecified: Secondary | ICD-10-CM | POA: Insufficient documentation

## 2016-06-24 DIAGNOSIS — S91301D Unspecified open wound, right foot, subsequent encounter: Secondary | ICD-10-CM

## 2016-06-24 LAB — VAS US LOWER EXTREMITY ARTERIAL DUPLEX
RIGHT ANT DIST TIBAL SYS PSV: -11 cm/s
RTIBDISTSYS: 13 cm/s
Right peroneal sys PSV: 19 cm/s
Right super femoral dist sys PSV: 0 cm/s
Right super femoral mid sys PSV: 0 cm/s

## 2016-06-24 MED ORDER — BUPROPION HCL ER (XL) 150 MG PO TB24: 300.0000 mg | ORAL_TABLET | Freq: Every day | ORAL | Status: DC

## 2016-06-24 NOTE — Progress Notes (Signed)
Patient ID: Eugene Parker, male   DOB: Mar 31, 1961, 56 y.o.   MRN: [id number]  Reason for Consult: Re-evaluation (eval non healing wound R foot)   Referred by Ernestine Conrad, MD  Subjective:     HPI:  Eugene Parker is a 56 y.o. male positive for continued pain in his right lower extremity as well as a wound that has been slow to heal. He continues to follow in the wound center. His wife recently died and he is now smoking more than he previously was. When last he was seen in the hospital he had ABIs are consistent with patent recent stenting of his SFA. He now follows up with right lower extremity duplex. He does not have any fevers states that he has progression of his wound but that it is still slow to heal considerable pain. He does not have associated erythema. He continues to walk. He is interested in stopping smoking.  Past Medical History:  Diagnosis Date  . Anxiety   . Cellulitis and abscess of foot 03/22/2016  . Current smoker   . DJD (degenerative joint disease), lumbosacral   . Essential hypertension   . Heart attack   . Hyperlipemia    Family History  Problem Relation Age of Onset  . Heart disease Mother   . COPD Mother   . Heart disease Father    Past Surgical History:  Procedure Laterality Date  . INTRADISCAL INJECTION  Oct 2012   L5-S1  . LEFT HEART CATHETERIZATION WITH CORONARY ANGIOGRAM N/A 07/04/2014   Procedure: LEFT HEART CATHETERIZATION WITH CORONARY ANGIOGRAM;  Surgeon: Runell Gess, MD;  Location: [facility] CATH LAB;  Service: Cardiovascular;  Laterality: N/A;  . LEG SURGERY    . PERIPHERAL VASCULAR CATHETERIZATION N/A 02/03/2016   Procedure: Abdominal Aortogram;  Surgeon: Maeola Harman, MD;  Location: [facility] INVASIVE CV LAB;  Service: Cardiovascular;  Laterality: N/A;  . PERIPHERAL VASCULAR CATHETERIZATION N/A 02/03/2016   Procedure: Lower Extremity Angiography;  Surgeon: Maeola Harman, MD;  Location: [facility] INVASIVE CV LAB;   Service: Cardiovascular;  Laterality: N/A;  . PERIPHERAL VASCULAR CATHETERIZATION Right 02/03/2016   Procedure: Peripheral Vascular Intervention;  Surgeon: Maeola Harman, MD;  Location: [facility] INVASIVE CV LAB;  Service: Cardiovascular;  Laterality: Right;  SFA    Short Social History:  Social History  Substance Use Topics  . Smoking status: Current Every Day Smoker    Packs/day: 1.00    Years: 40.00    Types: Cigarettes    Start date: 04/22/1970  . Smokeless tobacco: Never Used  . Alcohol use 0.0 oz/week     Comment: occasisional    Allergies  Allergen Reactions  . Codeine Nausea Only    Current Outpatient Prescriptions  Medication Sig Dispense Refill  . aspirin 81 MG chewable tablet Chew 1 tablet (81 mg total) by mouth daily.    Marland Kitchen atorvastatin (LIPITOR) 80 MG tablet TAKE ONE TABLET BY MOUTH DAILY (Patient taking differently: Take 80 mg by mouth daily) 30 tablet 3  . clopidogrel (PLAVIX) 75 MG tablet Take 1 tablet (75 mg total) by mouth daily. 30 tablet 3  . diclofenac (VOLTAREN) 75 MG EC tablet Take 75 mg by mouth 2 (two) times daily.    . hydrOXYzine (ATARAX/VISTARIL) 25 MG tablet Take 25 mg by mouth 4 (four) times daily as needed for anxiety or itching.   3  . metoprolol tartrate (LOPRESSOR) 25 MG tablet TAKE ONE TABLET BY MOUTH TWICE DAILY (Patient taking differently:  TAKE 25 MG BY MOUTH TWICE DAILY) 60 tablet 2  . nitroGLYCERIN (NITROSTAT) 0.4 MG SL tablet Place 1 tablet (0.4 mg total) under the tongue every 5 (five) minutes x 3 doses as needed for chest pain. 25 tablet 2  . NITROSTAT 0.4 MG SL tablet DISSOLVE ONE TABLET UNDER TONGUE EVERY 5 MINUTES UP TO 3 DOSES AS NEEDED FOR CHEST PAIN 25 tablet 2  . oxyCODONE-acetaminophen (PERCOCET) 10-325 MG tablet Take 1 tablet by mouth every 6 (six) hours as needed for pain. 30 tablet 0  . pantoprazole (PROTONIX) 40 MG tablet TAKE ONE TABLET BY MOUTH DAILY AT 6AM 30 tablet 3  . silver sulfADIAZINE (SILVADENE) 1 % cream Apply  topically 2 (two) times daily. 50 g 0  . tamsulosin (FLOMAX) 0.4 MG CAPS capsule Take 1 capsule (0.4 mg total) by mouth daily. 30 capsule 0  . VENTOLIN HFA 108 (90 Base) MCG/ACT inhaler INHALE BY MOUTH AS DIRECTED 18 g 1  . VITAMIN D HIGH POTENCY 1000 units capsule Take 1,000 Units by mouth daily.  11  . gabapentin (NEURONTIN) 600 MG tablet Take 0.5 tablets (300 mg total) by mouth 3 (three) times daily. 45 tablet 0   No current facility-administered medications for this visit.     Review of Systems  Constitutional:  Constitutional negative. Respiratory: Respiratory negative.  Cardiovascular: Cardiovascular negative.  GI: Gastrointestinal negative.  Musculoskeletal: Positive for leg pain.  Skin: Positive for wound.  Neurological: Neurological negative. Hematologic: Hematologic/lymphatic negative.  Psychiatric: Positive for depressed mood.        Objective:  Objective   Vitals:   06/24/16 1343 06/24/16 1344  BP:  128/78  Pulse:  61  Resp:  18  Temp:  99.9 F (37.7 C)  TempSrc:  Oral  SpO2:  98%  Weight: 201 lb 6.4 oz (91.4 kg) 201 lb 6.4 oz (91.4 kg)  Height: 5\' 11"  (1.803 m) 5\' 11"  (1.803 m)   Body mass index is 28.09 kg/m.  Physical Exam  Constitutional: He is oriented to person, place, and time. He appears well-developed.  HENT:  Head: Normocephalic.  Eyes: Pupils are equal, round, and reactive to light.  Cardiovascular: Normal rate.   Pulses:      Femoral pulses are 2+ on the right side, and 2+ on the left side. Monophasic R dp/peroneal arteries  Pulmonary/Chest: Effort normal.  Abdominal: Soft. He exhibits no mass.  Musculoskeletal: Normal range of motion. He exhibits edema.  Neurological: He is alert and oriented to person, place, and time.  Skin:  Stable 6cm ulcer to dorsum of right foot, granulation at base, no erythema  Psychiatric: He has a normal mood and affect. His behavior is normal. Judgment and thought content normal.    Data: ABI 0.5 to  previously was 1.06. Stent of the origin of the femoral artery is occluded on duplex.     Assessment/Plan:     56 year old male previous underwent stenting of his proximal SFA on the right as well as treatment of his popliteal artery for occluded SFA. The last evaluation in December the hospital his work was noted to be patent he had progression of his wound in the positive direction. He is now smoking more than previously has his wife recently died. Unfortunately his stent is also gone down in the interim his ABIs now half of what it previously was with monophasic signals tobacco use up. He continues to take aspirin. I discussed the urgent need to stop smoking if he wants to save his  leg which he doesn't want to do at this time. Given his also depressed mood at this time we will send Wellbutrin to his pharmacy. We will also plan aortogram right lower extremity runoff possible intervention if this doesn't work we will consider bypass surgery. He is amenable to the plan.     Maeola Harman MD [facility]

## 2016-06-27 ENCOUNTER — Other Ambulatory Visit: Payer: Self-pay | Admitting: Cardiology

## 2016-06-28 ENCOUNTER — Encounter: Payer: Self-pay | Admitting: Vascular Surgery

## 2016-07-20 ENCOUNTER — Other Ambulatory Visit: Payer: Self-pay

## 2016-07-20 ENCOUNTER — Encounter (HOSPITAL_COMMUNITY): Admission: RE | Payer: Self-pay | Source: Ambulatory Visit

## 2016-07-20 ENCOUNTER — Ambulatory Visit (HOSPITAL_COMMUNITY): Admission: RE | Admit: 2016-07-20 | Payer: Medicare Other | Source: Ambulatory Visit | Admitting: Vascular Surgery

## 2016-07-20 SURGERY — ABDOMINAL AORTOGRAM W/LOWER EXTREMITY
Anesthesia: LOCAL

## 2016-07-23 ENCOUNTER — Other Ambulatory Visit: Payer: Self-pay | Admitting: Cardiology

## 2016-07-27 ENCOUNTER — Ambulatory Visit (HOSPITAL_COMMUNITY): Admission: RE | Admit: 2016-07-27 | Payer: Medicare Other | Source: Ambulatory Visit | Admitting: Vascular Surgery

## 2016-07-27 ENCOUNTER — Encounter (HOSPITAL_COMMUNITY): Admission: RE | Payer: Self-pay | Source: Ambulatory Visit

## 2016-07-27 SURGERY — ABDOMINAL AORTOGRAM W/LOWER EXTREMITY
Anesthesia: LOCAL

## 2016-08-02 ENCOUNTER — Telehealth: Payer: Self-pay | Admitting: *Deleted

## 2016-08-02 NOTE — Telephone Encounter (Signed)
I called patient to reschedule his aortogram with runoff, originally scheduled on 07-17-16, RS to 07-27-16 (No showed). According to his mother, he is not mentally able to proceed with this aortogram at this time. His wife passed away 3 weeks ago and Eugene Parker is having a tough time dealing with her death. The mother asked if we could left him contact us when he is ready to have the procedure. She will let him know that I will place his scheduling information in our pending folder. She understands that delaying his procedure may be detrimental to his peripheral vascula disease and may make his right foot ulcer worse. She voiced understanding of this but still thinks the patient "will wait a little while longer".

## 2016-10-11 ENCOUNTER — Other Ambulatory Visit: Payer: Self-pay | Admitting: Cardiology

## 2016-11-17 ENCOUNTER — Other Ambulatory Visit: Payer: Self-pay | Admitting: Cardiology

## 2016-11-18 ENCOUNTER — Other Ambulatory Visit: Payer: Self-pay

## 2016-11-23 ENCOUNTER — Encounter (HOSPITAL_COMMUNITY): Admission: RE | Payer: Self-pay | Source: Ambulatory Visit

## 2016-11-23 ENCOUNTER — Ambulatory Visit (HOSPITAL_COMMUNITY): Admission: RE | Admit: 2016-11-23 | Payer: Medicare Other | Source: Ambulatory Visit | Admitting: Vascular Surgery

## 2016-11-23 SURGERY — ABDOMINAL AORTOGRAM W/LOWER EXTREMITY
Anesthesia: LOCAL

## 2016-12-14 ENCOUNTER — Other Ambulatory Visit: Payer: Self-pay

## 2016-12-21 ENCOUNTER — Ambulatory Visit (HOSPITAL_COMMUNITY)
Admission: RE | Admit: 2016-12-21 | Discharge: 2016-12-21 | Disposition: A | Discharge status: Home / Self Care | Payer: Medicare Other | Source: Ambulatory Visit | Attending: Vascular Surgery | Admitting: Vascular Surgery

## 2016-12-21 ENCOUNTER — Encounter (HOSPITAL_COMMUNITY)
Admission: RE | Disposition: A | Discharge status: Home / Self Care | Payer: Self-pay | Source: Ambulatory Visit | Attending: Vascular Surgery

## 2016-12-21 ENCOUNTER — Ambulatory Visit (HOSPITAL_BASED_OUTPATIENT_CLINIC_OR_DEPARTMENT_OTHER): Payer: Medicare Other

## 2016-12-21 DIAGNOSIS — M47817 Spondylosis without myelopathy or radiculopathy, lumbosacral region: Secondary | ICD-10-CM | POA: Insufficient documentation

## 2016-12-21 DIAGNOSIS — Z7902 Long term (current) use of antithrombotics/antiplatelets: Secondary | ICD-10-CM | POA: Diagnosis not present

## 2016-12-21 DIAGNOSIS — I739 Peripheral vascular disease, unspecified: Secondary | ICD-10-CM

## 2016-12-21 DIAGNOSIS — I252 Old myocardial infarction: Secondary | ICD-10-CM | POA: Insufficient documentation

## 2016-12-21 DIAGNOSIS — I70234 Atherosclerosis of native arteries of right leg with ulceration of heel and midfoot: Secondary | ICD-10-CM | POA: Diagnosis not present

## 2016-12-21 DIAGNOSIS — I998 Other disorder of circulatory system: Secondary | ICD-10-CM | POA: Diagnosis not present

## 2016-12-21 DIAGNOSIS — I10 Essential (primary) hypertension: Secondary | ICD-10-CM | POA: Insufficient documentation

## 2016-12-21 DIAGNOSIS — F419 Anxiety disorder, unspecified: Secondary | ICD-10-CM | POA: Diagnosis not present

## 2016-12-21 DIAGNOSIS — T82856A Stenosis of peripheral vascular stent, initial encounter: Secondary | ICD-10-CM | POA: Diagnosis not present

## 2016-12-21 DIAGNOSIS — Z01810 Encounter for preprocedural cardiovascular examination: Secondary | ICD-10-CM

## 2016-12-21 DIAGNOSIS — F17210 Nicotine dependence, cigarettes, uncomplicated: Secondary | ICD-10-CM | POA: Diagnosis not present

## 2016-12-21 DIAGNOSIS — Z7982 Long term (current) use of aspirin: Secondary | ICD-10-CM | POA: Diagnosis not present

## 2016-12-21 DIAGNOSIS — Y831 Surgical operation with implant of artificial internal device as the cause of abnormal reaction of the patient, or of later complication, without mention of misadventure at the time of the procedure: Secondary | ICD-10-CM | POA: Insufficient documentation

## 2016-12-21 DIAGNOSIS — E785 Hyperlipidemia, unspecified: Secondary | ICD-10-CM | POA: Insufficient documentation

## 2016-12-21 DIAGNOSIS — L97419 Non-pressure chronic ulcer of right heel and midfoot with unspecified severity: Secondary | ICD-10-CM | POA: Diagnosis not present

## 2016-12-21 HISTORY — PX: ABDOMINAL AORTOGRAM W/LOWER EXTREMITY: CATH118223

## 2016-12-21 LAB — POCT I-STAT, CHEM 8
BUN: 7 mg/dL (ref 6–20)
Calcium, Ion: 1.19 mmol/L (ref 1.15–1.40)
Chloride: 106 mmol/L (ref 101–111)
Creatinine, Ser: 1 mg/dL (ref 0.61–1.24)
GLUCOSE: 92 mg/dL (ref 65–99)
HEMATOCRIT: 41 % (ref 39.0–52.0)
HEMOGLOBIN: 13.9 g/dL (ref 13.0–17.0)
POTASSIUM: 3.9 mmol/L (ref 3.5–5.1)
Sodium: 142 mmol/L (ref 135–145)
TCO2: 25 mmol/L (ref 22–32)

## 2016-12-21 SURGERY — ABDOMINAL AORTOGRAM W/LOWER EXTREMITY
Anesthesia: LOCAL

## 2016-12-21 MED ORDER — SODIUM CHLORIDE 0.9 % IJ SOLN
INTRAMUSCULAR | Status: AC
  Filled 2016-12-21: qty 50

## 2016-12-21 MED ORDER — LABETALOL HCL 5 MG/ML IV SOLN: 10.0000 mg | INTRAVENOUS | Status: DC | PRN

## 2016-12-21 MED ORDER — LIDOCAINE HCL (PF) 1 % IJ SOLN
INTRAMUSCULAR | Status: DC | PRN
  Administered 2016-12-21: 10 mL

## 2016-12-21 MED ORDER — LIDOCAINE HCL (PF) 1 % IJ SOLN
INTRAMUSCULAR | Status: AC
  Filled 2016-12-21: qty 30

## 2016-12-21 MED ORDER — MIDAZOLAM HCL 2 MG/2ML IJ SOLN
INTRAMUSCULAR | Status: AC
  Filled 2016-12-21: qty 2

## 2016-12-21 MED ORDER — MIDAZOLAM HCL 2 MG/2ML IJ SOLN
INTRAMUSCULAR | Status: DC | PRN
  Administered 2016-12-21: 1 mg via INTRAVENOUS

## 2016-12-21 MED ORDER — HEPARIN (PORCINE) IN NACL 2-0.9 UNIT/ML-% IJ SOLN
INTRAMUSCULAR | Status: AC | PRN
  Administered 2016-12-21: 1000 mL via INTRA_ARTERIAL

## 2016-12-21 MED ORDER — HYDRALAZINE HCL 20 MG/ML IJ SOLN
INTRAMUSCULAR | Status: DC | PRN
  Administered 2016-12-21: 10 mg via INTRAVENOUS

## 2016-12-21 MED ORDER — SODIUM CHLORIDE 0.9 % IV SOLN
INTRAVENOUS | Status: DC
  Administered 2016-12-21: 09:00:00 via INTRAVENOUS

## 2016-12-21 MED ORDER — IODIXANOL 320 MG/ML IV SOLN
INTRAVENOUS | Status: DC | PRN
  Administered 2016-12-21: 65 mL via INTRA_ARTERIAL

## 2016-12-21 MED ORDER — HEPARIN (PORCINE) IN NACL 2-0.9 UNIT/ML-% IJ SOLN
INTRAMUSCULAR | Status: AC
  Filled 2016-12-21: qty 1000

## 2016-12-21 MED ORDER — FENTANYL CITRATE (PF) 100 MCG/2ML IJ SOLN
INTRAMUSCULAR | Status: DC | PRN
  Administered 2016-12-21 (×2): 50 ug via INTRAVENOUS

## 2016-12-21 MED ORDER — HYDRALAZINE HCL 20 MG/ML IJ SOLN: 5.0000 mg | INTRAMUSCULAR | Status: DC | PRN

## 2016-12-21 MED ORDER — SODIUM CHLORIDE 0.9 % WEIGHT BASED INFUSION: 1.0000 mL/kg/h | INTRAVENOUS | Status: DC

## 2016-12-21 MED ORDER — FENTANYL CITRATE (PF) 100 MCG/2ML IJ SOLN
INTRAMUSCULAR | Status: AC
  Filled 2016-12-21: qty 2

## 2016-12-21 SURGICAL SUPPLY — 10 items
CATH OMNI FLUSH 5F 65CM (CATHETERS) ×2 IMPLANT
COVER PRB 48X5XTLSCP FOLD TPE (BAG) ×1 IMPLANT
COVER PROBE 5X48 (BAG) ×1
KIT MICROINTRODUCER STIFF 5F (SHEATH) ×2 IMPLANT
KIT PV (KITS) ×2 IMPLANT
SHEATH PINNACLE 5F 10CM (SHEATH) ×2 IMPLANT
SYR MEDRAD MARK V 150ML (SYRINGE) ×2 IMPLANT
TRANSDUCER W/STOPCOCK (MISCELLANEOUS) ×2 IMPLANT
TRAY PV CATH (CUSTOM PROCEDURE TRAY) ×2 IMPLANT
WIRE BENTSON .035X145CM (WIRE) ×2 IMPLANT

## 2016-12-21 NOTE — Op Note (Signed)
    Patient name: Eugene Parker MRN: [id number] DOB: 12/27/1960 Sex: male  12/21/2016  Pre-operative Diagnosis: critical right lower extremity ischemia Post-operative diagnosis:  Same Surgeon:  Apolinar JunesBrandon C. Randie Heinzain, MD Procedure Performed: 1.  US guided cannulation of left common femoral artery 2.  Aortogram with bilateral lower extremity runoff  3.  Moderate sedation with fentanyl and versed for 16 minutes  Indications:  [age] year old male with a history of right foot wound underwent stenting of the right SFA previously. This is now occluded by duplex and he is indicated for aortogram possible intervention.  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 Common femoral artery is patent SFA is occluded at its takeoff where the stent is reconstitutes above-knee popliteal artery but his disease there. Below the knee he has runoff via the peroneal and posterior tibial arteries to the level of the foot.   Procedure:  The patient was identified in the holding area and taken to room 8.  The patient was then placed supine on the table and prepped and draped in the usual sterile fashion.  A time out was called.  Ultrasound was used to evaluate the left common femoral artery.  It was patent .  A digital ultrasound image was acquired.  A micropuncture needle was used to access the left common femoral artery under ultrasound guidance.  An 018 wire was advanced without resistance and a micropuncture sheath was placed.  The 018 wire was removed and a benson wire was placed.  The micropuncture sheath was exchanged for a 5 french sheath.  An omniflush catheter was advanced over the wire to the level of L-1.  An abdominal angiogram was obtained.  Next, using the omniflush catheter and a benson wire, the aortic bifurcation was crossed and the catheter was placed into theright external iliac artery and right runoff was obtained. With the findings above  we elected he will need right femoral to below-knee popliteal artery bypass and he'll get vein mapping today. The catheter was removed over wire sheath will be removed and postoperative morning. Patient tolerated procedure well without immediate complication.    Contrast: 65cc   Kiaira Pointer C. Randie Heinzain, MD [facility] [place] Office: [phone] Pager: [phone]

## 2016-12-21 NOTE — Discharge Instructions (Signed)

## 2016-12-21 NOTE — Progress Notes (Signed)
Bilateral Lower Extremity Vein Map  Right Great Saphenous Vein  Segment Diameter Comment  1. Origin 8.6 mm   2. High Thigh 5 mm   3. Mid Thigh 5.1 mm Branch  4. Low Thigh 4.6 mm Branch  5. At Knee 4.9 mm   6. High Calf 4.2 mm Multiple branches  7. Low Calf 3.9 mm   8. Ankle 4.3 mm     Left Great Saphenous Vein  Segment Diameter Comment  1. Origin 4.2 mm   2. High Thigh 1.9 mm   3. Mid Thigh 2.1 mm   4. Low Thigh 2.2 mm   5. At Knee 1.8 mm   6. High Calf 2 mm   7. Low Calf 1.9 mm   8. Ankle 1.8 mm    12/21/16 4:00 PM Olen CordialGreg Phineas Mcenroe RVT

## 2016-12-21 NOTE — H&P (Signed)
Patient ID: Eugene Parker, male   DOB: 05/11/1960, 56 y.o.   MRN: [id number]  Reason for Consult: Re-evaluation (eval non healing wound R foot)   Referred by Ernestine ConradBluth, Kirk, MD  Subjective:     HPI:  Eugene Parker Parker is a 56 y.o. male positive for continued pain in his right lower extremity as well as a wound that has been slow to heal. He continues to follow in the wound center. His wife recently died and he is now smoking more than he previously was. When last he was seen in the hospital he had ABIs are consistent with patent recent stenting of his SFA. He now follows up with right lower extremity duplex. He does not have any fevers states that he has progression of his wound but that it is still slow to heal considerable pain. He does not have associated erythema. He continues to walk. He is interested in stopping smoking.      Past Medical History:  Diagnosis Date  . Anxiety   . Cellulitis and abscess of foot 03/22/2016  . Current smoker   . DJD (degenerative joint disease), lumbosacral   . Essential hypertension   . Heart attack   . Hyperlipemia         Family History  Problem Relation Age of Onset  . Heart disease Mother   . COPD Mother   . Heart disease Father    Past Surgical History:  Procedure Laterality Date  . INTRADISCAL INJECTION  Oct 2012   L5-S1  . LEFT HEART CATHETERIZATION WITH CORONARY ANGIOGRAM N/A 07/04/2014   Procedure: LEFT HEART CATHETERIZATION WITH CORONARY ANGIOGRAM;  Surgeon: Runell GessJonathan J Berry, MD;  [facility];  Service: Cardiovascular;  Laterality: N/A;  . LEG SURGERY    . PERIPHERAL VASCULAR CATHETERIZATION N/A 02/03/2016   Procedure: Abdominal Aortogram;  Surgeon: Maeola HarmanBrandon Christopher Nazirah Tri, MD;  [facility];  Service: Cardiovascular;  Laterality: N/A;  . PERIPHERAL VASCULAR CATHETERIZATION N/A 02/03/2016   Procedure: Lower Extremity Angiography;  Surgeon: Maeola HarmanBrandon Christopher Briani Maul, MD;   [facility];  Service: Cardiovascular;  Laterality: N/A;  . PERIPHERAL VASCULAR CATHETERIZATION Right 02/03/2016   Procedure: Peripheral Vascular Intervention;  Surgeon: Maeola HarmanBrandon Christopher Tory Mckissack, MD;  [facility];  Service: Cardiovascular;  Laterality: Right;  SFA    Short Social History:         Social History  Substance Use Topics  . Smoking status: Current Every Day Smoker    Packs/day: 1.00    Years: 40.00    Types: Cigarettes    Start date: 04/22/1970  . Smokeless tobacco: Never Used  . Alcohol use 0.0 oz/week      Comment: occasisional        Allergies  Allergen Reactions  . Codeine Nausea Only    Current Outpatient Prescriptions  Medication Sig Dispense Refill  . aspirin 81 MG chewable tablet Chew 1 tablet (81 mg total) by mouth daily.    Marland Kitchen. atorvastatin (LIPITOR) 80 MG tablet TAKE ONE TABLET BY MOUTH DAILY (Patient taking differently: Take 80 mg by mouth daily) 30 tablet 3  . clopidogrel (PLAVIX) 75 MG tablet Take 1 tablet (75 mg total) by mouth daily. 30 tablet 3  . diclofenac (VOLTAREN) 75 MG EC tablet Take 75 mg by mouth 2 (two) times daily.    . hydrOXYzine (ATARAX/VISTARIL) 25 MG tablet Take 25 mg by mouth 4 (four) times daily as needed for anxiety or itching.  3  . metoprolol tartrate (LOPRESSOR) 25 MG tablet TAKE ONE TABLET BY MOUTH TWICE DAILY (Patient taking differently: TAKE 25 MG BY MOUTH TWICE DAILY) 60 tablet 2  . nitroGLYCERIN (NITROSTAT) 0.4 MG SL tablet Place 1 tablet (0.4 mg total) under the tongue every 5 (five) minutes x 3 doses as needed for chest pain. 25 tablet 2  . NITROSTAT 0.4 MG SL tablet DISSOLVE ONE TABLET UNDER TONGUE EVERY 5 MINUTES UP TO 3 DOSES AS NEEDED FOR CHEST PAIN 25 tablet 2  . oxyCODONE-acetaminophen (PERCOCET) 10-325 MG tablet Take 1 tablet by mouth every 6 (six) hours as needed for pain. 30 tablet 0  . pantoprazole (PROTONIX) 40 MG tablet TAKE ONE TABLET BY MOUTH DAILY AT 6AM 30  tablet 3  . silver sulfADIAZINE (SILVADENE) 1 % cream Apply topically 2 (two) times daily. 50 g 0  . tamsulosin (FLOMAX) 0.4 MG CAPS capsule Take 1 capsule (0.4 mg total) by mouth daily. 30 capsule 0  . VENTOLIN HFA 108 (90 Base) MCG/ACT inhaler INHALE BY MOUTH AS DIRECTED 18 g 1  . VITAMIN D HIGH POTENCY 1000 units capsule Take 1,000 Units by mouth daily.  11  . gabapentin (NEURONTIN) 600 MG tablet Take 0.5 tablets (300 mg total) by mouth 3 (three) times daily. 45 tablet 0   No current facility-administered medications for this visit.     Review of Systems  Constitutional:  Constitutional negative. Respiratory: Respiratory negative.  Cardiovascular: Cardiovascular negative.  GI: Gastrointestinal negative.  Musculoskeletal: Positive for leg pain.  Skin: Positive for wound.  Neurological: Neurological negative. Hematologic: Hematologic/lymphatic negative.  Psychiatric: Positive for depressed mood.        Objective:  Objective       Vitals:   06/24/16 1343 06/24/16 1344  BP:  128/78  Pulse:  61  Resp:  18  Temp:  99.9 F (37.7 C)  TempSrc:  Oral  SpO2:  98%  Weight: 201 lb 6.4 oz (91.4 kg) 201 lb 6.4 oz (91.4 kg)  Height: 5\' 11"  (1.803 m) 5\' 11"  (1.803 m)   Body mass index is 28.09 kg/m.  Physical Exam  Constitutional: He is oriented to person, place, and time. He appears well-developed.  HENT:  Head: Normocephalic.  Eyes: Pupils are equal, round, and reactive to light.  Cardiovascular: Normal rate.   Pulses:      Femoral pulses are 2+ on the right side, and 2+ on the left side. Monophasic R dp/peroneal arteries  Pulmonary/Chest: Effort normal.  Abdominal: Soft. He exhibits no mass.  Musculoskeletal: Normal range of motion. He exhibits edema.  Neurological: He is alert and oriented to person, place, and time.  Skin:  Stable 6cm ulcer to dorsum of right foot, granulation at base, no erythema  Psychiatric: He has a normal mood and affect. His  behavior is normal. Judgment and thought content normal.    Data: ABI 0.5 to previously was 1.06. Stent of the origin of the femoral artery is occluded on duplex.     Assessment/Plan:   56 year old male previous underwent stenting of his proximal SFA on the right as well as treatment of his popliteal artery for occluded SFA. The last evaluation in December the hospital his work was noted to be patent he had progression of his wound in the positive direction. He is trying to quit smoking. Wound is stable. Plan for aortogram with ble runoff and possible intervention of right.   Maeola Harman MD [facility]

## 2016-12-21 NOTE — Progress Notes (Signed)
Site area: Left groin a 5 french arterial sheath was removed  Site Prior to Removal:  Level 0  Pressure Applied For 20 MINUTES    Bedrest Beginning at 1100pm  Manual:   Yes.    Patient Status During Pull:  stable  Post Pull Groin Site:  Level 0  Post Pull Instructions Given:  Yes.    Post Pull Pulses Present:  Yes.    Dressing Applied:  Yes.    Comments:  VS remain stable during sheath pull

## 2016-12-21 NOTE — Progress Notes (Signed)
Vein mapping completed/pt discharged

## 2016-12-22 ENCOUNTER — Encounter (HOSPITAL_COMMUNITY): Payer: Self-pay | Admitting: Vascular Surgery

## 2016-12-22 ENCOUNTER — Other Ambulatory Visit: Payer: Self-pay | Admitting: *Deleted

## 2016-12-23 ENCOUNTER — Other Ambulatory Visit: Payer: Self-pay | Admitting: Cardiology

## 2016-12-23 ENCOUNTER — Other Ambulatory Visit: Payer: Self-pay | Admitting: *Deleted

## 2016-12-23 MED ORDER — LISINOPRIL 5 MG PO TABS: 5.0000 mg | ORAL_TABLET | Freq: Every day | ORAL | 0 refills | Status: DC

## 2016-12-26 ENCOUNTER — Encounter: Payer: Self-pay | Admitting: Cardiology

## 2016-12-26 ENCOUNTER — Ambulatory Visit (INDEPENDENT_AMBULATORY_CARE_PROVIDER_SITE_OTHER): Payer: Medicare Other | Admitting: Cardiology

## 2016-12-26 VITALS — BP 118/76 | HR 61 | Ht 70.0 in | Wt 207.0 lb

## 2016-12-26 DIAGNOSIS — Z01810 Encounter for preprocedural cardiovascular examination: Secondary | ICD-10-CM

## 2016-12-26 DIAGNOSIS — I2510 Atherosclerotic heart disease of native coronary artery without angina pectoris: Secondary | ICD-10-CM | POA: Diagnosis not present

## 2016-12-26 DIAGNOSIS — R0602 Shortness of breath: Secondary | ICD-10-CM | POA: Diagnosis not present

## 2016-12-26 MED ORDER — METOPROLOL TARTRATE 25 MG PO TABS: 25.0000 mg | ORAL_TABLET | Freq: Two times a day (BID) | ORAL | 6 refills | Status: DC

## 2016-12-26 MED ORDER — LISINOPRIL 5 MG PO TABS: 5.0000 mg | ORAL_TABLET | Freq: Every day | ORAL | 6 refills | Status: DC

## 2016-12-26 MED ORDER — ATORVASTATIN CALCIUM 80 MG PO TABS: 80.0000 mg | ORAL_TABLET | Freq: Every day | ORAL | 3 refills | Status: DC

## 2016-12-26 NOTE — Progress Notes (Addendum)
Clinical Summary Eugene Parker is a 56 y.o.male seen today for follow up of the following medical problems.   1. CAD - hx of inferior STEMI 06/2014, received DES to LCX.  - echo 11/2014 LVEF 55-60%  - denies any chest pain. Stable SOB. Exertion primarily limited due to leg pain, sedentary lifestyle   2. Preoperative evaluation - being considered for left shoulder surgery - poor exercise capacity, primarily limited by chronic leg pains.   3. COPD - abnormal PFTs 03/2015,. Started on albuterol prn with improved symptoms. No recent symptoms.   4. PAD - followed by vascular - history of right footwound, had prior right SFA stenting that became occluded.  - on ASA and plavix per vacular patient reports.  Past Medical History:  Diagnosis Date  . Anxiety   . Cellulitis and abscess of foot 03/22/2016  . Current smoker   . DJD (degenerative joint disease), lumbosacral   . Essential hypertension   . Heart attack ([place])   . Hyperlipemia      Allergies  Allergen Reactions  . Codeine Nausea Only     Current Outpatient Prescriptions  Medication Sig Dispense Refill  . aspirin 81 MG chewable tablet Chew 1 tablet (81 mg total) by mouth daily.    Marland Kitchen atorvastatin (LIPITOR) 80 MG tablet TAKE ONE TABLET BY MOUTH DAILY (Patient taking differently: TAKE 80 MG BY MOUTH DAILY AT NIGHT) 30 tablet 3  . clopidogrel (PLAVIX) 75 MG tablet Take 1 tablet (75 mg total) by mouth daily. (Patient not taking: Reported on 07/25/2016) 30 tablet 3  . gabapentin (NEURONTIN) 600 MG tablet Take 0.5 tablets (300 mg total) by mouth 3 (three) times daily. (Patient not taking: Reported on 07/25/2016) 45 tablet 0  . lisinopril (PRINIVIL,ZESTRIL) 5 MG tablet Take 1 tablet (5 mg total) by mouth daily. 30 tablet 0  . metoprolol tartrate (LOPRESSOR) 25 MG tablet TAKE ONE TABLET BY MOUTH TWICE DAILY 14 tablet 0  . NITROSTAT 0.4 MG SL tablet DISSOLVE ONE TABLET UNDER TONGUE EVERY 5 MINUTES UP TO 3 DOSES AS NEEDED FOR  CHEST PAIN 25 tablet 2  . oxyCODONE-acetaminophen (PERCOCET) 10-325 MG tablet Take 1 tablet by mouth every 6 (six) hours as needed for pain. 30 tablet 0  . pantoprazole (PROTONIX) 40 MG tablet TAKE ONE TABLET BY MOUTH DAILY AT 6AM 30 tablet 3  . silver sulfADIAZINE (SILVADENE) 1 % cream Apply topically 2 (two) times daily. (Patient taking differently: Apply 1 application topically daily. ) 50 g 0  . tamsulosin (FLOMAX) 0.4 MG CAPS capsule Take 1 capsule (0.4 mg total) by mouth daily. (Patient not taking: Reported on 12/21/2016) 30 capsule 0  . VENTOLIN HFA 108 (90 Base) MCG/ACT inhaler INHALE BY MOUTH AS DIRECTED (Patient taking differently: INHALE 2 PUFF EVERY 4-6 HOURS AS NEEDED FOR WHEEZING OR SHORTNESS OF BREATH.) 18 g 1   Current Facility-Administered Medications  Medication Dose Route Frequency Provider Last Rate Last Dose  . buPROPion (WELLBUTRIN XL) 24 hr tablet 300 mg  300 mg Oral Daily Waynetta Sandy, MD         Past Surgical History:  Procedure Laterality Date  . ABDOMINAL AORTOGRAM W/LOWER EXTREMITY N/A 12/21/2016   Procedure: ABDOMINAL AORTOGRAM W/LOWER EXTREMITY;  Surgeon: Waynetta Sandy, MD;  Location: [place] [facility];  Service: Cardiovascular;  Laterality: N/A;  . INTRADISCAL INJECTION  Oct 2012   L5-S1  . LEFT HEART CATHETERIZATION WITH CORONARY ANGIOGRAM N/A 07/04/2014   Procedure: LEFT HEART CATHETERIZATION WITH  CORONARY ANGIOGRAM;  Surgeon: Lorretta Harp, MD;  Location: [facility] CATH LAB;  Service: Cardiovascular;  Laterality: N/A;  . LEG SURGERY    . PERIPHERAL VASCULAR CATHETERIZATION N/A 02/03/2016   Procedure: Abdominal Aortogram;  Surgeon: Waynetta Sandy, MD;  Location: [place] [facility];  Service: Cardiovascular;  Laterality: N/A;  . PERIPHERAL VASCULAR CATHETERIZATION N/A 02/03/2016   Procedure: Lower Extremity Angiography;  Surgeon: Waynetta Sandy, MD;  Location: [place] [facility];  Service: Cardiovascular;  Laterality: N/A;    . PERIPHERAL VASCULAR CATHETERIZATION Right 02/03/2016   Procedure: Peripheral Vascular Intervention;  Surgeon: Waynetta Sandy, MD;  Location: [place] [facility];  Service: Cardiovascular;  Laterality: Right;  SFA     Allergies  Allergen Reactions  . Codeine Nausea Only      Family History  Problem Relation Age of Onset  . Heart disease Mother   . COPD Mother   . Heart disease Father      Social History Eugene Parker reports that he has been smoking Cigarettes.  He started smoking about 46 years ago. He has a 40.00 pack-year smoking history. He has never used smokeless tobacco. Eugene Parker reports that he drinks alcohol.   Review of Systems CONSTITUTIONAL: No weight loss, fever, chills, weakness or fatigue.  HEENT: Eyes: No visual loss, blurred vision, double vision or yellow sclerae.No hearing loss, sneezing, congestion, runny nose or sore throat.  SKIN: No rash or itching.  CARDIOVASCULAR: per hpi RESPIRATORY: per hpi GASTROINTESTINAL: No anorexia, nausea, vomiting or diarrhea. No abdominal pain or blood.  GENITOURINARY: No burning on urination, no polyuria NEUROLOGICAL: No headache, dizziness, syncope, paralysis, ataxia, numbness or tingling in the extremities. No change in bowel or bladder control.  MUSCULOSKELETAL: No muscle, back pain, joint pain or stiffness.  LYMPHATICS: No enlarged nodes. No history of splenectomy.  PSYCHIATRIC: No history of depression or anxiety.  ENDOCRINOLOGIC: No reports of sweating, cold or heat intolerance. No polyuria or polydipsia.  Marland Kitchen   Physical Examination Vitals:   12/26/16 1139  BP: 118/76  Pulse: 61  SpO2: 99%   Vitals:   12/26/16 1139  Weight: 207 lb (93.9 kg)  Height: _0  (1.778 m)    Gen: resting comfortably, no acute distress HEENT: no scleral icterus, pupils equal round and reactive, no palptable cervical adenopathy,  CV: RRR, no m/rg, no jvd Resp: Clear to auscultation bilaterally GI: abdomen is soft,  non-tender, non-distended, normal bowel sounds, no hepatosplenomegaly MSK: extremities are warm, no edema.  Skin: warm, no rash Neuro:  no focal deficits Psych: appropriate affect   Diagnostic Studies  06/2014 Cath HEMODYNAMICS:   AO SYSTOLIC/AO DIASTOLIC: 427/06  LV SYSTOLIC/LV DIASTOLIC: 237/62  ANGIOGRAPHIC RESULTS:   1. Left main; normal  2. LAD; minor irregularities 3. Left circumflex; codominant with occluded mid AV groove after OM 3.  4. Right coronary artery; codominant with 40-50% segmental mid 7. Left ventriculography; RAO left ventriculogram was performed using  25 mL of Visipaque dye at 12 mL/second. The overall LVEF estimated  45-50 % With wall motion abnormalities moderate inferoapical and posterolateral hypokinesia  IMPRESSION:Eugene Parker has been occluded codominant circumflex with otherwise noncritical CAD. We will proceed with direct intervention using Angiomax, Brilenta, and drug eluting stent.  Procedure description: The patient received Angiomax bolus and infusion with an ACT of 435. A total of 185 mL of contrast was administered to the patient. Using a 6 [place] XB 3.5 similar guide catheter along with a 1/190 cm long pro-water guidewire and  a 2 mm x 12 mm Euphora balloon the lesion was crossed and angioplasty performed. The door to balloon time was 26 minutes. Following this A 2.75 mm x 23 mm long Xience drug-eluting stent was then carefully positioned and deployed at 12 atm and postdilated with a 3 mm x 20 mm long [place] Euphora balloon at 15 atm (3.1 mm) resulting in reduction with total occlusion to 0% residual with TIMI-3 flow and no dissection. The patient tolerated the procedure well. There are no hemodynamic or electrocardiographic sequela. The guidewire and catheter were removed and the sheath was secured.   Final impression: successful PCI and stenting of a occluded mid codominant circumflex in the setting of an acute inferolateral myocardial  infarction with a door to balloon time 26 minutes using Angiomax, Proventil and drug-eluting stent. The patient had noncritical disease otherwise with preserved LV function. He'll be treated with usual medications including beta blocker, statin drug and ACE inhibitor. His sheath will be removed in several hours and pressure held. He will be fast tracked with anticipated discharge in 48-72 hours. He left the lab in stable condition.   11/2014 echo Study Conclusions  - Left ventricle: The cavity size was normal. Wall thickness was increased in a pattern of mild LVH. Systolic function was normal. The estimated ejection fraction was in the range of 55% to 60%. Images were inadequate for LV wall motion assessment. However, no gross regional variation on available images. Diastolic dysfunction, grade indeterminate. - Aortic valve: Mildly to moderately calcified annulus. Mildly calcified leaflets. - Mitral valve: There was trivial regurgitation.  03/2015 PFTs: technically difficult due to poor patient cooperation. Was some evidence of obstruction.    Assessment and Plan   1. CAD - no current symptoms - he completed a year of DAPT related to his cardiac stent and brillinta was stopped. He has plavix on his list but he is unsure if he is taking it or if his vascular doctor had started it. We will message his vascular team.   2. Preoperative evaluation - being considered for vascular surgery -poor exercise tolerance, primarily due to leg pains - we will obtain an [place] nuclear stress test to further risk stratify  3. PAD - possible lower extremity bypass, await nuclear stress results regarding cardiac clearance.    Arnoldo Lenis, M.D.  12/27/16 Addendum Discussed with vascular, plavix had been started after prior lower extremity stenting. Since patient is off and due to the time since his stenting they have not recommended restarting  Carlyle Dolly MD

## 2016-12-26 NOTE — Patient Instructions (Signed)
Medication Instructions:  Your physician recommends that you continue on your current medications as directed. Please refer to the Current Medication list given to you today.   Labwork: NONE  Testing/Procedures: Your physician has requested that you have a lexiscan myoview. For further information please visit [email or web address]. Please follow instruction sheet, as given.    Follow-Up: Your physician wants you to follow-up in: 6 MONTHS.  You will receive a reminder letter in the mail two months in advance. If you don't receive a letter, please call our office to schedule the follow-up appointment.    Any Other Special Instructions Will Be Listed Below (If Applicable).     If you need a refill on your cardiac medications before your next appointment, please call your pharmacy.   

## 2016-12-30 ENCOUNTER — Encounter (HOSPITAL_COMMUNITY): Payer: Medicare Other

## 2016-12-30 ENCOUNTER — Inpatient Hospital Stay (HOSPITAL_COMMUNITY): Admission: RE | Admit: 2016-12-30 | Payer: Medicare Other | Source: Ambulatory Visit

## 2017-01-02 ENCOUNTER — Inpatient Hospital Stay (HOSPITAL_COMMUNITY): Admission: RE | Admit: 2017-01-02 | Payer: Medicare Other | Source: Ambulatory Visit

## 2017-01-06 ENCOUNTER — Inpatient Hospital Stay (HOSPITAL_COMMUNITY): Admission: RE | Admit: 2017-01-06 | Payer: Medicare Other | Source: Ambulatory Visit

## 2017-01-06 ENCOUNTER — Encounter (HOSPITAL_COMMUNITY): Payer: Medicare Other

## 2017-01-09 ENCOUNTER — Telehealth: Payer: Self-pay | Admitting: *Deleted

## 2017-01-09 NOTE — Telephone Encounter (Signed)
Call to Dr. Verna Czech office for Stress Test results. Patient was a no show no call for this appointment. Call to patient who  Stated he did not go because someone called him and said "He was approved" Per Stacy A. Nurse at Dr. Verna Czech office patient does need to have this done prior to cardiac clearence and needs to call hospital to resched. Eugene Parker called and instructed to call Eugene Parker to have stress test done ASAP and make every effort to keep appointment. Then call us back and resched surgery. Patient verbalizes understanding.

## 2017-01-11 ENCOUNTER — Inpatient Hospital Stay (HOSPITAL_COMMUNITY): Admission: RE | Admit: 2017-01-11 | Payer: Medicare Other | Source: Ambulatory Visit

## 2017-01-12 ENCOUNTER — Encounter (HOSPITAL_COMMUNITY): Admission: RE | Payer: Self-pay | Source: Ambulatory Visit

## 2017-01-12 ENCOUNTER — Inpatient Hospital Stay (HOSPITAL_COMMUNITY): Admission: RE | Admit: 2017-01-12 | Payer: Medicare Other | Source: Ambulatory Visit | Admitting: Vascular Surgery

## 2017-01-12 SURGERY — BYPASS GRAFT FEMORAL-POPLITEAL ARTERY
Anesthesia: General | Laterality: Right

## 2017-01-17 ENCOUNTER — Ambulatory Visit (HOSPITAL_COMMUNITY): Payer: Medicare Other

## 2017-01-17 ENCOUNTER — Inpatient Hospital Stay (HOSPITAL_COMMUNITY): Admission: RE | Admit: 2017-01-17 | Payer: Medicare Other | Source: Ambulatory Visit

## 2017-01-23 ENCOUNTER — Telehealth: Payer: Self-pay | Admitting: *Deleted

## 2017-01-23 NOTE — Telephone Encounter (Signed)
Called Dr. Verna Czech office and confirmed with Lisette Abu that patient was indeed a no show for his 10/2 Stress Test appointment. This was a resched. Test due to no show in Sept. No answer on call to patient this am.

## 2017-01-30 ENCOUNTER — Other Ambulatory Visit: Payer: Self-pay | Admitting: Cardiology

## 2017-02-06 ENCOUNTER — Encounter (HOSPITAL_COMMUNITY)
Admission: RE | Admit: 2017-02-06 | Discharge: 2017-02-06 | Disposition: A | Discharge status: Home / Self Care | Payer: Medicare Other | Source: Ambulatory Visit | Attending: Cardiology | Admitting: Cardiology

## 2017-02-06 ENCOUNTER — Encounter: Payer: Self-pay | Admitting: Physician Assistant

## 2017-02-06 ENCOUNTER — Encounter (HOSPITAL_COMMUNITY): Payer: Self-pay

## 2017-02-06 DIAGNOSIS — R0602 Shortness of breath: Secondary | ICD-10-CM | POA: Insufficient documentation

## 2017-02-06 HISTORY — DX: Systemic involvement of connective tissue, unspecified: M35.9

## 2017-02-06 MED ORDER — REGADENOSON 0.4 MG/5ML IV SOLN
INTRAVENOUS | Status: AC
  Filled 2017-02-06: qty 5

## 2017-02-06 MED ORDER — SODIUM CHLORIDE 0.9% FLUSH
INTRAVENOUS | Status: AC
  Filled 2017-02-06: qty 10

## 2017-02-06 MED ORDER — TECHNETIUM TC 99M TETROFOSMIN IV KIT
10.0000 | PACK | Freq: Once | INTRAVENOUS | Status: AC | PRN
  Administered 2017-02-06: 11 via INTRAVENOUS

## 2017-02-06 MED ORDER — TECHNETIUM TC 99M TETROFOSMIN IV KIT: 30.0000 | PACK | Freq: Once | INTRAVENOUS | Status: DC | PRN

## 2017-02-06 NOTE — Progress Notes (Addendum)
   Pt presented for nuclear stress test today and incidentally found to be in rapid atrial fib with RVR.   Per review, Mr. Eugene FearingJames is a 56 y/o M with history of inferior STEMI 06/2014 s/p DES to LCx (EF 45-50% by cath, 55-60% by echo at that time), ongoing tobacco abuse, COPD, anxiety/depression, HTN, HLD, possible remote stroke vs TIA, PAD s/p R SFA stenting in 02/2016 with leg ulcer. Brilinta was stopped after he'd completed 1 year of use. He was placed back on Plavix after leg stenting in 02/2016 but he reports he only had one bottle of it so he self-discontinued it. He has been taking aspirin. In 12/2016 he underwent repeat PV angiography for progressive symptoms finding at least small aortic aneurysm, patent CFA but occluded SFA. Dr. Randie Heinzain felt he will need R fem to below-knee popliteal artery bypass. He was sent to Dr. Wyline MoodBranch for pre-op eval. Lexiscan nuclear stress test was ordered given overall poor exercise capacity limited by chronic leg pain. He arrived for this today and was found to be in rapid atrial fibrillation with HR 152, BP 102/87. F/u SBP without intervention 114. He denies any awareness of this whatsoever. He denies any recent chest pain or dyspnea. No palpitations or strokelike sx. Denies prior history of bleeding issues. Re: BP, he's on metoprolol 25mg  BID and lisinopril 5mg  which he took today. EKG reveals atrial fib with prior septal infarct, nonspecific ST-T changes, ST coving in avL.  Stress test cancelled given tachycardia. Given his softer blood pressure and new-onset atrial fib, we recommended he proceed to the emergency room for further workup as well as control of his tachycardia in light of his borderline hypoension. He adamantly refuses to do so despite counseling that refusing to go may result in death, CHF, angina, or life-threatening stroke. He is adamant that he "has things to do and I'll come if I start feeling bad." Dr. Purvis SheffieldKoneswaran came to reiterate these recommendations and he  again declined. Dr. Purvis SheffieldKoneswaran discussed with Dr. Wyline MoodBranch who will come speak with patient shortly in nuc med.  Eugene Dunn PA-C

## 2017-02-07 ENCOUNTER — Ambulatory Visit: Payer: Medicare Other | Admitting: Cardiology

## 2017-02-07 NOTE — Progress Notes (Deleted)
Clinical Summary Mr. Dacosta is a 56 y.o.male seen today for follow up of the following medical problems.   1. CAD - hx of inferior STEMI 06/2014, received DES to LCX.  - echo 11/2014 LVEF 55-60%  - denies any chest pain. Stable SOB. Exertion primarily limited due to leg pain, sedentary lifestyle   2. Preoperative evaluation - being considered for left shoulder surgery - poor exercise capacity, primarily limited by chronic leg pains.   3. COPD - abnormal PFTs 03/2015,. Started on albuterol prn with improved symptoms. No recent symptoms.   4. PAD - followed by vascular - history of right footwound, had prior right SFA stenting that became occluded.  - on ASA and plavix per vacular patient reports.   5. Afib - patient in afib with RVR at his appointment for stress test yesterday, new finding for patient - he denies any palpitations, no lightheadness or dizziness   Past Medical History:  Diagnosis Date  . Anxiety   . CAD in native artery    a. STEMI 06/2014 s/p DES to Cx.  . Cellulitis and abscess of foot 03/22/2016  . Collagen vascular disease ([place])   . Current smoker   . DJD (degenerative joint disease), lumbosacral   . Essential hypertension   . Heart attack ([place])   . Hyperlipemia   . PAD (peripheral artery disease) ([place])   . Stroke (cerebrum) ([facility])      Allergies  Allergen Reactions  . Codeine Nausea Only     Current Outpatient Prescriptions  Medication Sig Dispense Refill  . aspirin 81 MG chewable tablet Chew 1 tablet (81 mg total) by mouth daily.    Marland Kitchen atorvastatin (LIPITOR) 80 MG tablet Take 1 tablet (80 mg total) by mouth daily. 90 tablet 3  . clopidogrel (PLAVIX) 75 MG tablet Take 1 tablet (75 mg total) by mouth daily. 30 tablet 3  . gabapentin (NEURONTIN) 600 MG tablet Take 0.5 tablets (300 mg total) by mouth 3 (three) times daily. (Patient not taking: Reported on 07/25/2016) 45 tablet 0  . lisinopril (PRINIVIL,ZESTRIL) 5 MG tablet Take 1 tablet  (5 mg total) by mouth daily. 90 tablet 6  . metoprolol tartrate (LOPRESSOR) 25 MG tablet Take 1 tablet (25 mg total) by mouth 2 (two) times daily. 180 tablet 6  . NITROSTAT 0.4 MG SL tablet DISSOLVE ONE TABLET UNDER TONGUE EVERY 5 MINUTES UP TO 3 DOSES AS NEEDED FOR CHEST PAIN 25 tablet 2  . oxyCODONE-acetaminophen (PERCOCET) 10-325 MG tablet Take 1 tablet by mouth every 6 (six) hours as needed for pain. 30 tablet 0  . pantoprazole (PROTONIX) 40 MG tablet TAKE ONE TABLET BY MOUTH DAILY AT 6 AM 30 tablet 6  . VENTOLIN HFA 108 (90 Base) MCG/ACT inhaler INHALE BY MOUTH AS DIRECTED (Patient taking differently: INHALE 2 PUFF EVERY 4-6 HOURS AS NEEDED FOR WHEEZING OR SHORTNESS OF BREATH.) 18 g 1   Current Facility-Administered Medications  Medication Dose Route Frequency Provider Last Rate Last Dose  . buPROPion (WELLBUTRIN XL) 24 hr tablet 300 mg  300 mg Oral Daily Waynetta Sandy, MD       Facility-Administered Medications Ordered in Other Visits  Medication Dose Route Frequency Provider Last Rate Last Dose  . technetium tetrofosmin (TC-MYOVIEW) injection 30 millicurie  30 millicurie Intravenous Once PRN Lavonia Dana, MD         Past Surgical History:  Procedure Laterality Date  . ABDOMINAL AORTOGRAM W/LOWER EXTREMITY N/A 12/21/2016   Procedure: ABDOMINAL AORTOGRAM  W/LOWER EXTREMITY;  Surgeon: Waynetta Sandy, MD;  Location: [place] [facility];  Service: Cardiovascular;  Laterality: N/A;  . INTRADISCAL INJECTION  Oct 2012   L5-S1  . LEFT HEART CATHETERIZATION WITH CORONARY ANGIOGRAM N/A 07/04/2014   Procedure: LEFT HEART CATHETERIZATION WITH CORONARY ANGIOGRAM;  Surgeon: Lorretta Harp, MD;  Location: [facility] LAB;  Service: Cardiovascular;  Laterality: N/A;  . LEG SURGERY    . PERIPHERAL VASCULAR CATHETERIZATION N/A 02/03/2016   Procedure: Abdominal Aortogram;  Surgeon: Waynetta Sandy, MD;  Location: [place] [facility];  Service: Cardiovascular;  Laterality: N/A;    . PERIPHERAL VASCULAR CATHETERIZATION N/A 02/03/2016   Procedure: Lower Extremity Angiography;  Surgeon: Waynetta Sandy, MD;  Location: [place] [facility];  Service: Cardiovascular;  Laterality: N/A;  . PERIPHERAL VASCULAR CATHETERIZATION Right 02/03/2016   Procedure: Peripheral Vascular Intervention;  Surgeon: Waynetta Sandy, MD;  Location: [place] [facility];  Service: Cardiovascular;  Laterality: Right;  SFA     Allergies  Allergen Reactions  . Codeine Nausea Only      Family History  Problem Relation Age of Onset  . Heart disease Mother   . COPD Mother   . Heart disease Father      Social History Mr. Esper reports that he has been smoking Cigarettes.  He started smoking about 46 years ago. He has a 40.00 pack-year smoking history. He has never used smokeless tobacco. Mr. Thorstenson reports that he drinks alcohol.   Review of Systems CONSTITUTIONAL: No weight loss, fever, chills, weakness or fatigue.  HEENT: Eyes: No visual loss, blurred vision, double vision or yellow sclerae.No hearing loss, sneezing, congestion, runny nose or sore throat.  SKIN: No rash or itching.  CARDIOVASCULAR:  RESPIRATORY: No shortness of breath, cough or sputum.  GASTROINTESTINAL: No anorexia, nausea, vomiting or diarrhea. No abdominal pain or blood.  GENITOURINARY: No burning on urination, no polyuria NEUROLOGICAL: No headache, dizziness, syncope, paralysis, ataxia, numbness or tingling in the extremities. No change in bowel or bladder control.  MUSCULOSKELETAL: No muscle, back pain, joint pain or stiffness.  LYMPHATICS: No enlarged nodes. No history of splenectomy.  PSYCHIATRIC: No history of depression or anxiety.  ENDOCRINOLOGIC: No reports of sweating, cold or heat intolerance. No polyuria or polydipsia.  Marland Kitchen   Physical Examination There were no vitals filed for this visit. There were no vitals filed for this visit.  Gen: resting comfortably, no acute distress HEENT: no  scleral icterus, pupils equal round and reactive, no palptable cervical adenopathy,  CV Resp: Clear to auscultation bilaterally GI: abdomen is soft, non-tender, non-distended, normal bowel sounds, no hepatosplenomegaly MSK: extremities are warm, no edema.  Skin: warm, no rash Neuro:  no focal deficits Psych: appropriate affect   Diagnostic Studies  06/2014 Cath HEMODYNAMICS:   AO SYSTOLIC/AO DIASTOLIC: 202/54  LV SYSTOLIC/LV DIASTOLIC: 270/62  ANGIOGRAPHIC RESULTS:   1. Left main; normal  2. LAD; minor irregularities 3. Left circumflex; codominant with occluded mid AV groove after OM 3.  4. Right coronary artery; codominant with 40-50% segmental mid 7. Left ventriculography; RAO left ventriculogram was performed using  25 mL of Visipaque dye at 12 mL/second. The overall LVEF estimated  45-50 % With wall motion abnormalities moderate inferoapical and posterolateral hypokinesia  IMPRESSION:Mr. Durfee has been occluded codominant circumflex with otherwise noncritical CAD. We will proceed with direct intervention using Angiomax, Brilenta, and drug eluting stent.  Procedure description: The patient received Angiomax bolus and infusion with an ACT of 435. A total  of 185 mL of contrast was administered to the patient. Using a 6 [place] XB 3.5 similar guide catheter along with a 1/190 cm long pro-water guidewire and a 2 mm x 12 mm Euphora balloon the lesion was crossed and angioplasty performed. The door to balloon time was 26 minutes. Following this A 2.75 mm x 23 mm long Xience drug-eluting stent was then carefully positioned and deployed at 12 atm and postdilated with a 3 mm x 20 mm long [place] Euphora balloon at 15 atm (3.1 mm) resulting in reduction with total occlusion to 0% residual with TIMI-3 flow and no dissection. The patient tolerated the procedure well. There are no hemodynamic or electrocardiographic sequela. The guidewire and catheter were removed and the sheath was  secured.   Final impression: successful PCI and stenting of a occluded mid codominant circumflex in the setting of an acute inferolateral myocardial infarction with a door to balloon time 26 minutes using Angiomax, Proventil and drug-eluting stent. The patient had noncritical disease otherwise with preserved LV function. He'll be treated with usual medications including beta blocker, statin drug and ACE inhibitor. His sheath will be removed in several hours and pressure held. He will be fast tracked with anticipated discharge in 48-72 hours. He left the lab in stable condition.   11/2014 echo Study Conclusions  - Left ventricle: The cavity size was normal. Wall thickness was increased in a pattern of mild LVH. Systolic function was normal. The estimated ejection fraction was in the range of 55% to 60%. Images were inadequate for LV wall motion assessment. However, no gross regional variation on available images. Diastolic dysfunction, grade indeterminate. - Aortic valve: Mildly to moderately calcified annulus. Mildly calcified leaflets. - Mitral valve: There was trivial regurgitation.  03/2015 PFTs: technically difficult due to poor patient cooperation. Was some evidence of obstruction.    Assessment and Plan  1. CAD - no current symptoms - he completed a year of DAPT related to his cardiac stent and brillinta was stopped. He has plavix on his list but he is unsure if he is taking it or if his vascular doctor had started it. We will message his vascular team.   2. Preoperative evaluation - being considered for vascular surgery -poor exercise tolerance, primarily due to leg pains - we will obtain an [place] nuclear stress test to further risk stratify  3. PAD - possible lower extremity bypass, await nuclear stress results regarding cardiac clearance.       Arnoldo Lenis, M.D., F.A.C.C.

## 2017-02-10 ENCOUNTER — Encounter: Payer: Self-pay | Admitting: Cardiology

## 2017-02-10 ENCOUNTER — Ambulatory Visit: Payer: Medicare Other | Admitting: Cardiology

## 2017-02-10 NOTE — Progress Notes (Deleted)
Clinical Summary Eugene Parker is a 56 y.o.male  1. Afib - new diagnosis during recent stress test - patient denies any significant symptoms   1. CAD - hx of inferior STEMI 06/2014, received DES to LCX.  - echo 11/2014 LVEF 55-60%  - denies any chest pain. Stable SOB. Exertion primarily limited due to leg pain, sedentary lifestyle   2. Preoperative evaluation - being considered for left shoulder surgery - poor exercise capacity, primarily limited by chronic leg pains.   3. COPD - abnormal PFTs 03/2015,. Started on albuterol prn with improved symptoms. No recent symptoms.   4. PAD - followed by vascular - history of right footwound, had prior right SFA stenting that became occluded.  - on ASA and plavix per vacular patient reports.   Past Medical History:  Diagnosis Date  . Anxiety   . CAD in native artery    a. STEMI 06/2014 s/p DES to Cx.  . Cellulitis and abscess of foot 03/22/2016  . Collagen vascular disease ([place])   . Current smoker   . DJD (degenerative joint disease), lumbosacral   . Essential hypertension   . Heart attack ([place])   . Hyperlipemia   . PAD (peripheral artery disease) ([place])   . Stroke (cerebrum) ([facility])      Allergies  Allergen Reactions  . Codeine Nausea Only     Current Outpatient Prescriptions  Medication Sig Dispense Refill  . aspirin 81 MG chewable tablet Chew 1 tablet (81 mg total) by mouth daily.    Marland Kitchen atorvastatin (LIPITOR) 80 MG tablet Take 1 tablet (80 mg total) by mouth daily. 90 tablet 3  . clopidogrel (PLAVIX) 75 MG tablet Take 1 tablet (75 mg total) by mouth daily. 30 tablet 3  . gabapentin (NEURONTIN) 600 MG tablet Take 0.5 tablets (300 mg total) by mouth 3 (three) times daily. (Patient not taking: Reported on 07/25/2016) 45 tablet 0  . lisinopril (PRINIVIL,ZESTRIL) 5 MG tablet Take 1 tablet (5 mg total) by mouth daily. 90 tablet 6  . metoprolol tartrate (LOPRESSOR) 25 MG tablet Take 1 tablet (25 mg total) by mouth 2 (two)  times daily. 180 tablet 6  . NITROSTAT 0.4 MG SL tablet DISSOLVE ONE TABLET UNDER TONGUE EVERY 5 MINUTES UP TO 3 DOSES AS NEEDED FOR CHEST PAIN 25 tablet 2  . oxyCODONE-acetaminophen (PERCOCET) 10-325 MG tablet Take 1 tablet by mouth every 6 (six) hours as needed for pain. 30 tablet 0  . pantoprazole (PROTONIX) 40 MG tablet TAKE ONE TABLET BY MOUTH DAILY AT 6 AM 30 tablet 6  . VENTOLIN HFA 108 (90 Base) MCG/ACT inhaler INHALE BY MOUTH AS DIRECTED (Patient taking differently: INHALE 2 PUFF EVERY 4-6 HOURS AS NEEDED FOR WHEEZING OR SHORTNESS OF BREATH.) 18 g 1   Current Facility-Administered Medications  Medication Dose Route Frequency Provider Last Rate Last Dose  . buPROPion (WELLBUTRIN XL) 24 hr tablet 300 mg  300 mg Oral Daily Waynetta Sandy, MD       Facility-Administered Medications Ordered in Other Visits  Medication Dose Route Frequency Provider Last Rate Last Dose  . technetium tetrofosmin (TC-MYOVIEW) injection 30 millicurie  30 millicurie Intravenous Once PRN Lavonia Dana, MD         Past Surgical History:  Procedure Laterality Date  . ABDOMINAL AORTOGRAM W/LOWER EXTREMITY N/A 12/21/2016   Procedure: ABDOMINAL AORTOGRAM W/LOWER EXTREMITY;  Surgeon: Waynetta Sandy, MD;  Location: [facility];  Service: Cardiovascular;  Laterality: N/A;  . INTRADISCAL INJECTION  Oct 2012   L5-S1  . LEFT HEART CATHETERIZATION WITH CORONARY ANGIOGRAM N/A 07/04/2014   Procedure: LEFT HEART CATHETERIZATION WITH CORONARY ANGIOGRAM;  Surgeon: Lorretta Harp, MD;  Location: [facility];  Service: Cardiovascular;  Laterality: N/A;  . LEG SURGERY    . PERIPHERAL VASCULAR CATHETERIZATION N/A 02/03/2016   Procedure: Abdominal Aortogram;  Surgeon: Waynetta Sandy, MD;  Location: [facility];  Service: Cardiovascular;  Laterality: N/A;  . PERIPHERAL VASCULAR CATHETERIZATION N/A 02/03/2016   Procedure: Lower Extremity Angiography;  Surgeon: Waynetta Sandy, MD;   Location: [facility];  Service: Cardiovascular;  Laterality: N/A;  . PERIPHERAL VASCULAR CATHETERIZATION Right 02/03/2016   Procedure: Peripheral Vascular Intervention;  Surgeon: Waynetta Sandy, MD;  Location: [facility];  Service: Cardiovascular;  Laterality: Right;  SFA     Allergies  Allergen Reactions  . Codeine Nausea Only      Family History  Problem Relation Age of Onset  . Heart disease Mother   . COPD Mother   . Heart disease Father      Social History Eugene Parker reports that he has been smoking Cigarettes.  He started smoking about 46 years ago. He has a 40.00 pack-year smoking history. He has never used smokeless tobacco. Eugene Parker reports that he drinks alcohol.   Review of Systems CONSTITUTIONAL: No weight loss, fever, chills, weakness or fatigue.  HEENT: Eyes: No visual loss, blurred vision, double vision or yellow sclerae.No hearing loss, sneezing, congestion, runny nose or sore throat.  SKIN: No rash or itching.  CARDIOVASCULAR:  RESPIRATORY: No shortness of breath, cough or sputum.  GASTROINTESTINAL: No anorexia, nausea, vomiting or diarrhea. No abdominal pain or blood.  GENITOURINARY: No burning on urination, no polyuria NEUROLOGICAL: No headache, dizziness, syncope, paralysis, ataxia, numbness or tingling in the extremities. No change in bowel or bladder control.  MUSCULOSKELETAL: No muscle, back pain, joint pain or stiffness.  LYMPHATICS: No enlarged nodes. No history of splenectomy.  PSYCHIATRIC: No history of depression or anxiety.  ENDOCRINOLOGIC: No reports of sweating, cold or heat intolerance. No polyuria or polydipsia.  Marland Kitchen   Physical Examination There were no vitals filed for this visit. There were no vitals filed for this visit.  Gen: resting comfortably, no acute distress HEENT: no scleral icterus, pupils equal round and reactive, no palptable cervical adenopathy,  CV Resp: Clear to auscultation bilaterally GI:  abdomen is soft, non-tender, non-distended, normal bowel sounds, no hepatosplenomegaly MSK: extremities are warm, no edema.  Skin: warm, no rash Neuro:  no focal deficits Psych: appropriate affect   Diagnostic Studies  06/2014 Cath HEMODYNAMICS:   AO SYSTOLIC/AO DIASTOLIC: 341/93  LV SYSTOLIC/LV DIASTOLIC: 790/24  ANGIOGRAPHIC RESULTS:   1. Left main; normal  2. LAD; minor irregularities 3. Left circumflex; codominant with occluded mid AV groove after OM 3.  4. Right coronary artery; codominant with 40-50% segmental mid 7. Left ventriculography; RAO left ventriculogram was performed using  25 mL of Visipaque dye at 12 mL/second. The overall LVEF estimated  45-50 % With wall motion abnormalities moderate inferoapical and posterolateral hypokinesia  IMPRESSION:Mr. Schnitzer has been occluded codominant circumflex with otherwise noncritical CAD. We will proceed with direct intervention using Angiomax, Brilenta, and drug eluting stent.  Procedure description: The patient received Angiomax bolus and infusion with an ACT of 435. A total of 185 mL of contrast was administered to the patient. Using a 6 [place] XB 3.5 similar guide catheter along with a 1/190 cm long pro-water  guidewire and a 2 mm x 12 mm Eup[place] balloon the lesion was crossed and angioplasty performed. The door to balloon time was 26 minutes. Following this A 2.75 mm x 23 mm long Xience drug-eluting stent was then carefully positioned and deployed at 12 atm and postdilated with a 3 mm x 20 mm long [place] Euphora balloon at 15 atm (3.1 mm) resulting in reduction with total occlusion to 0% residual with TIMI-3 flow and no dissection. The patient tolerated the procedure well. There are no hemodynamic or electrocardiographic sequela. The guidewire and catheter were removed and the sheath was secured.   Final impression: successful PCI and stenting of a occluded mid codominant circumflex in the setting of an acute  inferolateral myocardial infarction with a door to balloon time 26 minutes using Angiomax, Proventil and drug-eluting stent. The patient had noncritical disease otherwise with preserved LV function. He'll be treated with usual medications including beta blocker, statin drug and ACE inhibitor. His sheath will be removed in several hours and pressure held. He will be fast tracked with anticipated discharge in 48-72 hours. He left the lab in stable condition.   11/2014 echo Study Conclusions  - Left ventricle: The cavity size was normal. Wall thickness was increased in a pattern of mild LVH. Systolic function was normal. The estimated ejection fraction was in the range of 55% to 60%. Images were inadequate for LV wall motion assessment. However, no gross regional variation on available images. Diastolic dysfunction, grade indeterminate. - Aortic valve: Mildly to moderately calcified annulus. Mildly calcified leaflets. - Mitral valve: There was trivial regurgitation.  03/2015 PFTs: technically difficult due to poor patient cooperation. Was some evidence of obstruction.      Assessment and Plan   1. CAD - no current symptoms - he completed a year of DAPT related to his cardiac stent and brillinta was stopped. He has plavix on his list but he is unsure if he is taking it or if his vascular doctor had started it. We will message his vascular team.   2. Preoperative evaluation - being considered for vascular surgery -poor exercise tolerance, primarily due to leg pains - we will obtain an [place] nuclear stress test to further risk stratify  3. PAD - possible lower extremity bypass, await nuclear stress results regarding cardiac clearance.    Eugene Parker, M.D.  12/27/16 Addendum Discussed with vascular, plavix had been started after prior lower extremity stenting. Since patient is off and due to the time since his stenting they have not recommended  restarting  Carlyle Dolly MD     Eugene Parker, M.D., F.A.C.C.

## 2017-02-20 ENCOUNTER — Encounter: Payer: Self-pay | Admitting: Adult Health

## 2017-02-20 ENCOUNTER — Ambulatory Visit (INDEPENDENT_AMBULATORY_CARE_PROVIDER_SITE_OTHER): Payer: Medicare Other | Admitting: Adult Health

## 2017-02-20 VITALS — BP 158/84 | HR 80 | Ht 70.0 in | Wt 209.0 lb

## 2017-02-20 DIAGNOSIS — I739 Peripheral vascular disease, unspecified: Secondary | ICD-10-CM

## 2017-02-20 DIAGNOSIS — I2510 Atherosclerotic heart disease of native coronary artery without angina pectoris: Secondary | ICD-10-CM

## 2017-02-20 DIAGNOSIS — E7800 Pure hypercholesterolemia, unspecified: Secondary | ICD-10-CM | POA: Diagnosis not present

## 2017-02-20 DIAGNOSIS — I10 Essential (primary) hypertension: Secondary | ICD-10-CM

## 2017-02-20 MED ORDER — METOPROLOL TARTRATE 25 MG PO TABS: ORAL_TABLET | ORAL | 6 refills | Status: DC

## 2017-02-20 NOTE — Patient Instructions (Signed)
Medication Instructions:  Your physician recommends that you continue on your current medications as directed. Please refer to the Current Medication list given to you today.   Labwork: NONE   Testing/Procedures: Your physician has requested that you have a lexiscan myoview. For further information please visit [email or web address] Please follow instruction sheet, as given.   Follow-Up: Your physician recommends that you schedule a follow-up appointment in: 105Month    Any Other Special Instructions Will Be Listed Below (If Applicable).     If you need a refill on your cardiac medications before your next appointment, please call your pharmacy.  Thank you for choosing [facility]!

## 2017-02-20 NOTE — Progress Notes (Signed)
Cardiology Office Note   Date:  02/20/2017   ID:  Eugene Parker, DOB November 13, 1960, MRN [id number]  PCP:  Samuel Jester, DO  Cardiologist: Dina Rich, MD Chief Complaint  Patient presents with  . Coronary Artery Disease  . Atrial Fibrillation      History of Present Illness: Eugene Parker is a 56 y.o. male who presents for ongoing assessment and management of coronary artery disease, with history of DES to L CX in 06/2014, htn, COPD, PAD, with history of ongoing tobacco abuse. Was last seen by Dr. Wyline Mood on 12/26/2016 for pre-operative evaluation for shoulder surgery. He was planned for a stress test.   Unfortunately, was seen by Ronie Spies, PA on 02/06/2017 in the setting of new onset atrial fib. Stress test was cancelled. He was advised to ER but refused despite recommendations. He was continued on DAPT with ASA and Plavix, metoprolol, and lisinopril. The patient stated that he would come to ER if he felt bad.    Patient has not had his stress test rescheduled.  He continues to have some bouts of anxiety and depression.  He lost his wife on May 31, 2016 and continues to mourn her death.  He often has anxiety around this.  He is not felt his heart rate racing.  He is unaware when his blood pressure is elevated.  He is now on metoprolol 25 mg in the a.m. and 37.5 mg in the p.m.  Continues to suffer from a lot of insomnia.  The patient is due to see his primary care physician, Dr. Charm Barges, on March 01, 2017.  He will discussed with her at that time the need for assistance with his anxiety and or insomnia.  He is to have the stress test as a preoperative evaluation for possible right femoropopliteal bypass versus intervention with stenting.  This is yet to be determined.  Past Medical History:  Diagnosis Date  . Anxiety   . CAD in native artery    a. STEMI 06/2014 s/p DES to Cx.  . Cellulitis and abscess of foot 03/22/2016  . Collagen vascular disease ([facility])   .  Current smoker   . DJD (degenerative joint disease), lumbosacral   . Essential hypertension   . Heart attack ([facility])   . Hyperlipemia   . PAD (peripheral artery disease) ([facility])   . Stroke (cerebrum) [facility])     Past Surgical History:  Procedure Laterality Date  . INTRADISCAL INJECTION  Oct 2012   L5-S1  . LEG SURGERY       Current Outpatient Medications  Medication Sig Dispense Refill  . aspirin 81 MG chewable tablet Chew 1 tablet (81 mg total) by mouth daily.    Marland Kitchen atorvastatin (LIPITOR) 80 MG tablet Take 1 tablet (80 mg total) by mouth daily. 90 tablet 3  . lisinopril (PRINIVIL,ZESTRIL) 5 MG tablet Take 1 tablet (5 mg total) by mouth daily. 90 tablet 6  . metoprolol tartrate (LOPRESSOR) 25 MG tablet Take 1 tablet (25 mg total) by mouth 2 (two) times daily. 180 tablet 6  . NITROSTAT 0.4 MG SL tablet DISSOLVE ONE TABLET UNDER TONGUE EVERY 5 MINUTES UP TO 3 DOSES AS NEEDED FOR CHEST PAIN 25 tablet 2  . oxyCODONE-acetaminophen (PERCOCET) 10-325 MG tablet Take 1 tablet by mouth every 6 (six) hours as needed for pain. 30 tablet 0  . VENTOLIN HFA 108 (90 Base) MCG/ACT inhaler INHALE BY MOUTH AS DIRECTED (Patient taking differently: INHALE 2 PUFF EVERY 4-6 HOURS AS  NEEDED FOR WHEEZING OR SHORTNESS OF BREATH.) 18 g 1  . clopidogrel (PLAVIX) 75 MG tablet Take 1 tablet (75 mg total) by mouth daily. 30 tablet 3   Current Facility-Administered Medications  Medication Dose Route Frequency Provider Last Rate Last Dose  . buPROPion (WELLBUTRIN XL) 24 hr tablet 300 mg  300 mg Oral Daily Maeola Harman, MD        Allergies:   Codeine    Social History:  The patient  reports that he has been smoking cigarettes.  He started smoking about 46 years ago. He has a 40.00 pack-year smoking history. he has never used smokeless tobacco. He reports that he drinks alcohol. He reports that he uses drugs. Drug: Marijuana.   Family History:  The patient's family history includes COPD in his mother; Heart  disease in his father and mother.    ROS: All other systems are reviewed and negative. Unless otherwise mentioned in H&P    PHYSICAL EXAM: VS:  Ht 5\' 10"  (1.778 m)   Wt 209 lb (94.8 kg)   BMI 29.99 kg/m  , BMI Body mass index is 29.99 kg/m. GEN: Well nourished, well developed, in no acute distress  HEENT: normal  Neck: no JVD, carotid bruits, or masses Cardiac: RRR; no murmurs, rubs, or gallops,no edema no pulse is palpated in the right dorsalis pedis or posterior tibial. Respiratory:  clear to auscultation bilaterally, normal work of breathing GI: soft, nontender, nondistended, + BS MS: no deformity or atrophy  Skin: warm and dry, no rash Neuro:  Strength and sensation are intact Psych: euthymic mood, full affect   Recent Labs: 03/22/2016: ALT 32; Platelets 183 03/23/2016: Magnesium 1.9 12/21/2016: BUN 7; Creatinine, Ser 1.00; Hemoglobin 13.9; Potassium 3.9; Sodium 142    Lipid Panel    Component Value Date/Time   CHOL 151 07/05/2014 0432   TRIG 127 07/05/2014 0432   HDL 28 (L) 07/05/2014 0432   CHOLHDL 5.4 07/05/2014 0432   VLDL 25 07/05/2014 0432   LDLCALC 98 07/05/2014 0432      Wt Readings from Last 3 Encounters:  02/20/17 209 lb (94.8 kg)  12/26/16 207 lb (93.9 kg)  12/21/16 210 lb (95.3 kg)      Other studies Reviewed: Echocardiogram 2013-09-22 Left ventricle: The cavity size was normal. Wall thickness was   increased in a pattern of mild LVH. Systolic function was normal.   The estimated ejection fraction was in the range of 55% to 60%.   Images were inadequate for LV wall motion assessment. However, no   gross regional variation on available images. Diastolic   dysfunction, grade indeterminate. - Aortic valve: Mildly to moderately calcified annulus. Mildly   calcified leaflets. - Mitral valve: There was trivial regurgitation.   ASSESSMENT AND PLAN:  1.  Peripheral arterial disease: Plan for intervention of the right leg with prior SFA stenting which is  been occluded.  Consideration for bypass versus other intervention per surgeon.  Patient is to be rescheduled for [facility] for preoperative evaluation for coronary artery disease prior to PAD procedure.  Prior stress test was canceled due to hypertension and rapid heart rhythm.  Metoprolol was increased to 25 mg in the a.m. and 37.5 mg in the p.m.  Heart rate is currently well controlled and regular.  We will reschedule stress test after March 01, 2017.  This is because the patient is going to see his primary care physician for help with anxiety which he felt was related to his elevated  blood pressure and heart rate the day of the test.  Once he has been seen by primary care with medication prescription for plan for stress test.  2.  Hypertension: Elevated on this office visit.  The patient states he cannot tell when his blood pressure is up.  We may need to increase his lisinopril from 5 mg daily to 10 mg daily.  Weight primary care physicians medication for anxiety prior to adjusting his blood pressure medications, to evaluate whether anxiety is causing his pressure to be higher.  Necessary will titrate up lisinopril.  3.  Hypercholesterolemia: Continue statin therapy as directed.   Current medicines are reviewed at length with the patient today.    Labs/ tests ordered today include: Lexiscan Myoview Bettey MareKathryn M. Liborio NixonLawrence DNP, ANP, AACC   02/20/2017 3:27 PM    [facility] [address], [place], [place] [address] Phone: [phone]; Fax: [phone]

## 2017-05-01 ENCOUNTER — Other Ambulatory Visit: Payer: Self-pay | Admitting: *Deleted

## 2017-05-08 ENCOUNTER — Other Ambulatory Visit: Payer: Self-pay | Admitting: Cardiology

## 2017-05-16 ENCOUNTER — Other Ambulatory Visit: Payer: Self-pay

## 2017-05-16 DIAGNOSIS — I739 Peripheral vascular disease, unspecified: Secondary | ICD-10-CM

## 2017-06-09 ENCOUNTER — Ambulatory Visit: Payer: Medicare Other | Admitting: Vascular Surgery

## 2017-06-09 ENCOUNTER — Encounter (HOSPITAL_COMMUNITY): Payer: Medicare Other

## 2017-07-07 ENCOUNTER — Other Ambulatory Visit: Payer: Self-pay | Admitting: Cardiology

## 2017-08-11 ENCOUNTER — Encounter

## 2017-08-11 ENCOUNTER — Encounter (HOSPITAL_COMMUNITY): Payer: Medicare Other

## 2017-08-11 ENCOUNTER — Ambulatory Visit: Payer: Medicare Other | Admitting: Vascular Surgery

## 2017-09-01 ENCOUNTER — Other Ambulatory Visit: Payer: Self-pay | Admitting: Cardiology

## 2017-09-15 ENCOUNTER — Ambulatory Visit (HOSPITAL_COMMUNITY)
Admission: RE | Admit: 2017-09-15 | Discharge: 2017-09-15 | Disposition: A | Discharge status: Home / Self Care | Payer: Medicare Other | Source: Ambulatory Visit | Attending: Vascular Surgery | Admitting: Vascular Surgery

## 2017-09-15 ENCOUNTER — Telehealth: Payer: Self-pay | Admitting: *Deleted

## 2017-09-15 ENCOUNTER — Other Ambulatory Visit: Payer: Self-pay | Admitting: *Deleted

## 2017-09-15 ENCOUNTER — Other Ambulatory Visit: Payer: Self-pay

## 2017-09-15 ENCOUNTER — Ambulatory Visit (INDEPENDENT_AMBULATORY_CARE_PROVIDER_SITE_OTHER): Payer: Medicare Other | Admitting: Vascular Surgery

## 2017-09-15 ENCOUNTER — Encounter: Payer: Self-pay | Admitting: *Deleted

## 2017-09-15 ENCOUNTER — Encounter: Payer: Self-pay | Admitting: Vascular Surgery

## 2017-09-15 VITALS — BP 134/84 | HR 65 | Temp 98.1°F | Resp 20 | Ht 70.0 in | Wt 211.2 lb

## 2017-09-15 DIAGNOSIS — I10 Essential (primary) hypertension: Secondary | ICD-10-CM | POA: Diagnosis not present

## 2017-09-15 DIAGNOSIS — I739 Peripheral vascular disease, unspecified: Secondary | ICD-10-CM

## 2017-09-15 DIAGNOSIS — L97519 Non-pressure chronic ulcer of other part of right foot with unspecified severity: Secondary | ICD-10-CM | POA: Diagnosis not present

## 2017-09-15 DIAGNOSIS — X58XXXA Exposure to other specified factors, initial encounter: Secondary | ICD-10-CM | POA: Insufficient documentation

## 2017-09-15 DIAGNOSIS — T82868A Thrombosis of vascular prosthetic devices, implants and grafts, initial encounter: Secondary | ICD-10-CM | POA: Insufficient documentation
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
## 2017-09-15 NOTE — Telephone Encounter (Signed)
-----   Message from Retta Macebecca J Sahmya Arai, RN sent at 09/15/2017 12:39 PM EDT ----- Regarding: CARDIAC CLEARANCE FOR SURGERY Patient has appointment with you on 09/19/17. Requesting cardiac clearance for femoral -popliteal bypass on right leg with Dr. Randie Heinzain on 09/28/17. Form has also been faxed to office. Thank you for your time, [facility] Surgery CenterBecky RN

## 2017-09-19 ENCOUNTER — Ambulatory Visit: Payer: Medicare Other | Admitting: Cardiology

## 2017-09-19 NOTE — Progress Notes (Deleted)
Clinical Summary Mr. Lothrop is a 57 y.o.male seen today for follow up of the following medical problems.   1. CAD - hx of inferior STEMI 06/2014, received DES to LCX.  - echo 11/2014 LVEF 55-60%  - denies any chest pain. Stable SOB. Exertion primarily limited due to leg pain, sedentary lifestyle    3. COPD - abnormal PFTs 03/2015,. Started on albuterol prn with improved symptoms. No recent symptoms.   4. PAD - followed by vascular - history of right footwound, had prior right SFA stenting that became occluded.  - on ASA and plavix per vacular patient reports.   5. Afib - seen 02/06/17 note by PA Dunn. Patient presented for lexiscan, found to be in afib with RVR. Test cancelled CHADS2Vasc score is  (CAD/PAD, HTN, CVA??)  6. Preoperative evaluation - being considered for right femoropopliteal bypass. Also request a request for clearance for a dental surgery -     Past Medical History:  Diagnosis Date  . Anxiety   . CAD in native artery    a. STEMI 06/2014 s/p DES to Cx.  . Cellulitis and abscess of foot 03/22/2016  . Collagen vascular disease ([place])   . Current smoker   . DJD (degenerative joint disease), lumbosacral   . Essential hypertension   . Heart attack ([place])   . Hyperlipemia   . PAD (peripheral artery disease) ([place])   . Stroke (cerebrum) (HCC)      Allergies  Allergen Reactions  . Codeine Nausea Only     Current Outpatient Medications  Medication Sig Dispense Refill  . ALPRAZolam (XANAX) 0.5 MG tablet Take 0.5 mg by mouth 2 (two) times daily.  3  . aspirin 81 MG chewable tablet Chew 1 tablet (81 mg total) by mouth daily. (Patient not taking: Reported on 09/15/2017)    . atorvastatin (LIPITOR) 80 MG tablet Take 1 tablet (80 mg total) by mouth daily. 90 tablet 3  . clopidogrel (PLAVIX) 75 MG tablet Take 1 tablet (75 mg total) by mouth daily. (Patient not taking: Reported on 09/15/2017) 30 tablet 3  . lisinopril (PRINIVIL,ZESTRIL) 5 MG tablet Take 1  tablet (5 mg total) by mouth daily. 90 tablet 6  . metoprolol tartrate (LOPRESSOR) 25 MG tablet Take 25 mg ( 1 Tablet)  in the AM and Take 37.5 mg ( 1 1/2 Tablet)  in the PM (Patient not taking: Reported on 09/15/2017) 75 tablet 6  . NITROSTAT 0.4 MG SL tablet DISSOLVE ONE TABLET UNDER TONGUE EVERY 5 MINUTES UP TO 3 DOSES AS NEEDED FOR CHEST PAIN (Patient not taking: Reported on 09/15/2017) 25 tablet 2  . Oxycodone HCl 20 MG TABS Take 1 tablet every 8 (eight) hours as needed by mouth.  0  . pantoprazole (PROTONIX) 40 MG tablet TAKE ONE TABLET BY MOUTH DAILY AT 6 AM  6  . VENTOLIN HFA 108 (90 Base) MCG/ACT inhaler INHALE BY MOUTH AS DIRECTED 18 g 1   Current Facility-Administered Medications  Medication Dose Route Frequency Provider Last Rate Last Dose  . buPROPion (WELLBUTRIN XL) 24 hr tablet 300 mg  300 mg Oral Daily Waynetta Sandy, MD         Past Surgical History:  Procedure Laterality Date  . ABDOMINAL AORTOGRAM W/LOWER EXTREMITY N/A 12/21/2016   Procedure: ABDOMINAL AORTOGRAM W/LOWER EXTREMITY;  Surgeon: Waynetta Sandy, MD;  Location: [place] CV LAB;  Service: Cardiovascular;  Laterality: N/A;  . INTRADISCAL INJECTION  Oct 2012   L5-S1  .  LEFT HEART CATHETERIZATION WITH CORONARY ANGIOGRAM N/A 07/04/2014   Procedure: LEFT HEART CATHETERIZATION WITH CORONARY ANGIOGRAM;  Surgeon: Lorretta Harp, MD;  Location: [facility] CATH LAB;  Service: Cardiovascular;  Laterality: N/A;  . LEG SURGERY    . PERIPHERAL VASCULAR CATHETERIZATION N/A 02/03/2016   Procedure: Abdominal Aortogram;  Surgeon: Waynetta Sandy, MD;  Location: [place] CV LAB;  Service: Cardiovascular;  Laterality: N/A;  . PERIPHERAL VASCULAR CATHETERIZATION N/A 02/03/2016   Procedure: Lower Extremity Angiography;  Surgeon: Waynetta Sandy, MD;  Location: [place] CV LAB;  Service: Cardiovascular;  Laterality: N/A;  . PERIPHERAL VASCULAR CATHETERIZATION Right 02/03/2016   Procedure:  Peripheral Vascular Intervention;  Surgeon: Waynetta Sandy, MD;  Location: [place] CV LAB;  Service: Cardiovascular;  Laterality: Right;  SFA     Allergies  Allergen Reactions  . Codeine Nausea Only      Family History  Problem Relation Age of Onset  . Heart disease Mother   . COPD Mother   . Heart disease Father      Social History Mr. Deavers reports that he has been smoking cigarettes.  He started smoking about 47 years ago. He has a 40.00 pack-year smoking history. He has never used smokeless tobacco. Mr. Ericsson reports that he drinks alcohol.   Review of Systems CONSTITUTIONAL: No weight loss, fever, chills, weakness or fatigue.  HEENT: Eyes: No visual loss, blurred vision, double vision or yellow sclerae.No hearing loss, sneezing, congestion, runny nose or sore throat.  SKIN: No rash or itching.  CARDIOVASCULAR:  RESPIRATORY: No shortness of breath, cough or sputum.  GASTROINTESTINAL: No anorexia, nausea, vomiting or diarrhea. No abdominal pain or blood.  GENITOURINARY: No burning on urination, no polyuria NEUROLOGICAL: No headache, dizziness, syncope, paralysis, ataxia, numbness or tingling in the extremities. No change in bowel or bladder control.  MUSCULOSKELETAL: No muscle, back pain, joint pain or stiffness.  LYMPHATICS: No enlarged nodes. No history of splenectomy.  PSYCHIATRIC: No history of depression or anxiety.  ENDOCRINOLOGIC: No reports of sweating, cold or heat intolerance. No polyuria or polydipsia.  Marland Kitchen   Physical Examination There were no vitals filed for this visit. There were no vitals filed for this visit.  Gen: resting comfortably, no acute distress HEENT: no scleral icterus, pupils equal round and reactive, no palptable cervical adenopathy,  CV Resp: Clear to auscultation bilaterally GI: abdomen is soft, non-tender, non-distended, normal bowel sounds, no hepatosplenomegaly MSK: extremities are warm, no edema.  Skin: warm, no  rash Neuro:  no focal deficits Psych: appropriate affect   Diagnostic Studies 06/2014 Cath HEMODYNAMICS:   AO SYSTOLIC/AO DIASTOLIC: 626/94  LV SYSTOLIC/LV DIASTOLIC: 854/62  ANGIOGRAPHIC RESULTS:   1. Left main; normal  2. LAD; minor irregularities 3. Left circumflex; codominant with occluded mid AV groove after OM 3.  4. Right coronary artery; codominant with 40-50% segmental mid 7. Left ventriculography; RAO left ventriculogram was performed using  25 mL of Visipaque dye at 12 mL/second. The overall LVEF estimated  45-50 % With wall motion abnormalities moderate inferoapical and posterolateral hypokinesia  IMPRESSION:Mr. Wixom has been occluded codominant circumflex with otherwise noncritical CAD. We will proceed with direct intervention using Angiomax, Brilenta, and drug eluting stent.  Procedure description: The patient received Angiomax bolus and infusion with an ACT of 435. A total of 185 mL of contrast was administered to the patient. Using a 6 [place] XB 3.5 similar guide catheter along with a 1/190 cm long pro-water guidewire and a 2 mm x 12 mm  Eup[place] balloon the lesion was crossed and angioplasty performed. The door to balloon time was 26 minutes. Following this A 2.75 mm x 23 mm long Xience drug-eluting stent was then carefully positioned and deployed at 12 atm and postdilated with a 3 mm x 20 mm long [place] Euphora balloon at 15 atm (3.1 mm) resulting in reduction with total occlusion to 0% residual with TIMI-3 flow and no dissection. The patient tolerated the procedure well. There are no hemodynamic or electrocardiographic sequela. The guidewire and catheter were removed and the sheath was secured.   Final impression: successful PCI and stenting of a occluded mid codominant circumflex in the setting of an acute inferolateral myocardial infarction with a door to balloon time 26 minutes using Angiomax, Proventil and drug-eluting stent. The patient had noncritical  disease otherwise with preserved LV function. He'll be treated with usual medications including beta blocker, statin drug and ACE inhibitor. His sheath will be removed in several hours and pressure held. He will be fast tracked with anticipated discharge in 48-72 hours. He left the lab in stable condition.   11/2014 echo Study Conclusions  - Left ventricle: The cavity size was normal. Wall thickness was increased in a pattern of mild LVH. Systolic function was normal. The estimated ejection fraction was in the range of 55% to 60%. Images were inadequate for LV wall motion assessment. However, no gross regional variation on available images. Diastolic dysfunction, grade indeterminate. - Aortic valve: Mildly to moderately calcified annulus. Mildly calcified leaflets. - Mitral valve: There was trivial regurgitation.  03/2015 PFTs: technically difficult due to poor patient cooperation. Was some evidence of obstruction.       Assessment and Plan  1. CAD - no current symptoms - he completed a year of DAPT related to his cardiac stent and brillinta was stopped. He has plavix on his list but he is unsure if he is taking it or if his vascular doctor had started it. We will message his vascular team.   2. Preoperative evaluation - being considered for vascular surgery -poor exercise tolerance, primarily due to leg pains - we will obtain an [place] nuclear stress test to further risk stratify  3. PAD - possible lower extremity bypass, await nuclear stress results regarding cardiac clearance.      12/27/16 Addendum Discussed with vascular, plavix had been started after prior lower extremity stenting. Since patient is off and due to the time since his stenting they have not recommended restarting   Arnoldo Lenis, M.D., F.A.C.C.

## 2017-09-21 ENCOUNTER — Telehealth: Payer: Self-pay | Admitting: Vascular Surgery

## 2017-09-21 NOTE — Telephone Encounter (Signed)
LVM for pt ZO:[id number]:appt w/Dr.Branch for cardiac clearance on 09/27/17 at 1:40pm at the [place] located at [address].  Per Joyce GrossKay, his surgery was cancelled for the same date and we will not reschedule any surgery until the appt with Dr.Branch is complete. awt

## 2017-09-27 ENCOUNTER — Encounter: Payer: Self-pay | Admitting: Cardiology

## 2017-09-27 ENCOUNTER — Telehealth: Payer: Self-pay | Admitting: Cardiology

## 2017-09-27 ENCOUNTER — Encounter (HOSPITAL_COMMUNITY): Payer: Self-pay

## 2017-09-27 ENCOUNTER — Ambulatory Visit (HOSPITAL_COMMUNITY): Admit: 2017-09-27 | Payer: Medicare Other | Admitting: Vascular Surgery

## 2017-09-27 ENCOUNTER — Ambulatory Visit (INDEPENDENT_AMBULATORY_CARE_PROVIDER_SITE_OTHER): Payer: Medicare Other | Admitting: Cardiology

## 2017-09-27 VITALS — BP 137/71 | HR 55 | Ht 70.0 in | Wt 206.4 lb

## 2017-09-27 DIAGNOSIS — I2510 Atherosclerotic heart disease of native coronary artery without angina pectoris: Secondary | ICD-10-CM | POA: Diagnosis not present

## 2017-09-27 DIAGNOSIS — R0602 Shortness of breath: Secondary | ICD-10-CM | POA: Diagnosis not present

## 2017-09-27 DIAGNOSIS — Z01818 Encounter for other preprocedural examination: Secondary | ICD-10-CM

## 2017-09-27 DIAGNOSIS — I10 Essential (primary) hypertension: Secondary | ICD-10-CM | POA: Diagnosis not present

## 2017-09-27 DIAGNOSIS — I4891 Unspecified atrial fibrillation: Secondary | ICD-10-CM

## 2017-09-27 SURGERY — ABDOMINAL AORTOGRAM W/LOWER EXTREMITY
Anesthesia: LOCAL | Laterality: Bilateral

## 2017-09-27 MED ORDER — APIXABAN 5 MG PO TABS: 5.0000 mg | ORAL_TABLET | Freq: Two times a day (BID) | ORAL | 0 refills | Status: DC

## 2017-09-27 NOTE — Telephone Encounter (Signed)
[facility] Arrive 8:30

## 2017-09-27 NOTE — Progress Notes (Signed)
Clinical Summary Eugene Parker is a 57 y.o.male seen today for follow up of the following medical problems.   1. CAD - hx of inferior STEMI 06/2014, received DES to LCX.  - echo 11/2014 LVEF 55-60%  - denies any chest pain. Stable SOB. Exertion primarily limited due to leg pain, sedentary lifestyle   2. COPD - abnormal PFTs 03/2015,. Started on albuterol prn with improved symptoms. No recent symptoms.   3. PAD - followed by vascular - history of right footwound, had prior right SFA stenting that became occluded.     4. Afib - seen 02/06/17 note by PA Dunn. Patient presented for lexiscan, found to be in afib with RVR. Test cancelled CHADS2Vasc score is  (CAD/PAD, HTN, CVA??)  - no recent palpitations.   5. Preoperative evaluation - being considered for right femoropopliteal bypass. Also request a request for clearance for a dental surgery - no recent chest pain, no SOB - limited by leg and hip pain, cannot walk a block or flight of stairs.    SH: wife passed 15 months  Past Medical History:  Diagnosis Date  . Anxiety   . CAD in native artery    a. STEMI 06/2014 s/p DES to Cx.  . Cellulitis and abscess of foot 03/22/2016  . Collagen vascular disease ([place])   . Current smoker   . DJD (degenerative joint disease), lumbosacral   . Essential hypertension   . Heart attack ([place])   . Hyperlipemia   . PAD (peripheral artery disease) ([place])   . Stroke (cerebrum) ([facility])      Allergies  Allergen Reactions  . Codeine Nausea Only     Current Outpatient Medications  Medication Sig Dispense Refill  . ALPRAZolam (XANAX) 0.5 MG tablet Take 0.5 mg by mouth 2 (two) times daily.  3  . aspirin 81 MG chewable tablet Chew 1 tablet (81 mg total) by mouth daily.    Marland Kitchen atorvastatin (LIPITOR) 80 MG tablet Take 1 tablet (80 mg total) by mouth daily. (Patient taking differently: Take 80 mg by mouth every evening. ) 90 tablet 3  . lisinopril (PRINIVIL,ZESTRIL) 10 MG tablet Take 10 mg  by mouth daily.  11  . metoprolol tartrate (LOPRESSOR) 25 MG tablet Take 25 mg ( 1 Tablet)  in the AM and Take 37.5 mg ( 1 1/2 Tablet)  in the PM (Patient taking differently: Take 25 mg by mouth 2 (two) times daily. ) 75 tablet 6  . NITROSTAT 0.4 MG SL tablet DISSOLVE ONE TABLET UNDER TONGUE EVERY 5 MINUTES UP TO 3 DOSES AS NEEDED FOR CHEST PAIN 25 tablet 2  . Oxycodone HCl 20 MG TABS Take 20 mg by mouth 2 (two) times daily.   0  . pantoprazole (PROTONIX) 40 MG tablet TAKE ONE TABLET BY MOUTH DAILY AT 6 AM  6  . sodium chloride irrigation 0.9 % irrigation Irrigate with 200 mLs as directed daily. IRRIGATE AFFECTED AREA OF FOOT (SPIDER BITE) ONCE DAILY    . VENTOLIN HFA 108 (90 Base) MCG/ACT inhaler INHALE BY MOUTH AS DIRECTED (Patient taking differently: INHALE 1-2 PUFFS EVERY 4-6 HOURS AS NEEDED FOR WHEEZING OR SHORTNESS OF BREATH) 18 g 1  . Wound Dressings (WOUND GEL EX) Apply 1 application topically daily. HEALING GEL APPLIED ONCE DAILY AFTER IRRIGATING WOUND (SPIDER BITE)     Current Facility-Administered Medications  Medication Dose Route Frequency Provider Last Rate Last Dose  . buPROPion (WELLBUTRIN XL) 24 hr tablet 300 mg  300 mg  Oral Daily Waynetta Sandy, MD         Past Surgical History:  Procedure Laterality Date  . ABDOMINAL AORTOGRAM W/LOWER EXTREMITY N/A 12/21/2016   Procedure: ABDOMINAL AORTOGRAM W/LOWER EXTREMITY;  Surgeon: Waynetta Sandy, MD;  Location: [place] [facility] LAB;  Service: Cardiovascular;  Laterality: N/A;  . INTRADISCAL INJECTION  Oct 2012   L5-S1  . LEFT HEART CATHETERIZATION WITH CORONARY ANGIOGRAM N/A 07/04/2014   Procedure: LEFT HEART CATHETERIZATION WITH CORONARY ANGIOGRAM;  Surgeon: Lorretta Harp, MD;  Location: [facility] CATH LAB;  Service: Cardiovascular;  Laterality: N/A;  . LEG SURGERY    . PERIPHERAL VASCULAR CATHETERIZATION N/A 02/03/2016   Procedure: Abdominal Aortogram;  Surgeon: Waynetta Sandy, MD;  Location: [place] CV  LAB;  Service: Cardiovascular;  Laterality: N/A;  . PERIPHERAL VASCULAR CATHETERIZATION N/A 02/03/2016   Procedure: Lower Extremity Angiography;  Surgeon: Waynetta Sandy, MD;  Location: [place] CV LAB;  Service: Cardiovascular;  Laterality: N/A;  . PERIPHERAL VASCULAR CATHETERIZATION Right 02/03/2016   Procedure: Peripheral Vascular Intervention;  Surgeon: Waynetta Sandy, MD;  Location: [place] CV LAB;  Service: Cardiovascular;  Laterality: Right;  SFA     Allergies  Allergen Reactions  . Codeine Nausea Only      Family History  Problem Relation Age of Onset  . Heart disease Mother   . COPD Mother   . Heart disease Father      Social History Eugene Parker reports that he has been smoking cigarettes.  He started smoking about 47 years ago. He has a 40.00 pack-year smoking history. He has never used smokeless tobacco. Eugene Parker reports that he drinks alcohol.   Review of Systems CONSTITUTIONAL: No weight loss, fever, chills, weakness or fatigue.  HEENT: Eyes: No visual loss, blurred vision, double vision or yellow sclerae.No hearing loss, sneezing, congestion, runny nose or sore throat.  SKIN: No rash or itching.  CARDIOVASCULAR: per hpi RESPIRATORY: No shortness of breath, cough or sputum.  GASTROINTESTINAL: No anorexia, nausea, vomiting or diarrhea. No abdominal pain or blood.  GENITOURINARY: No burning on urination, no polyuria NEUROLOGICAL: No headache, dizziness, syncope, paralysis, ataxia, numbness or tingling in the extremities. No change in bowel or bladder control.  MUSCULOSKELETAL: +leg pains LYMPHATICS: No enlarged nodes. No history of splenectomy.  PSYCHIATRIC: No history of depression or anxiety.  ENDOCRINOLOGIC: No reports of sweating, cold or heat intolerance. No polyuria or polydipsia.  Marland Kitchen   Physical Examination Vitals:   09/27/17 1333  BP: 137/71  Pulse: (!) 55  SpO2: 99%   Vitals:   09/27/17 1333  Weight: 206 lb 6.4 oz (93.6  kg)  Height: '5\' 10"'$  (1.778 m)    Gen: resting comfortably, no acute distress HEENT: no scleral icterus, pupils equal round and reactive, no palptable cervical adenopathy,  CV: RRR, no m/r/g, no jvd Resp: Clear to auscultation bilaterally GI: abdomen is soft, non-tender, non-distended, normal bowel sounds, no hepatosplenomegaly MSK: extremities are warm, no edema.  Skin: warm, no rash Neuro:  no focal deficits Psych: appropriate affect   Diagnostic Studies 06/2014 Cath HEMODYNAMICS:   AO SYSTOLIC/AO DIASTOLIC: 620/35  LV SYSTOLIC/LV DIASTOLIC: 597/41  ANGIOGRAPHIC RESULTS:   1. Left main; normal  2. LAD; minor irregularities 3. Left circumflex; codominant with occluded mid AV groove after OM 3.  4. Right coronary artery; codominant with 40-50% segmental mid 7. Left ventriculography; RAO left ventriculogram was performed using  25 mL of Visipaque dye at 12 mL/second. The overall LVEF estimated  45-50 % With wall motion abnormalities moderate inferoapical and posterolateral hypokinesia  IMPRESSION:Eugene Parker has been occluded codominant circumflex with otherwise noncritical CAD. We will proceed with direct intervention using Angiomax, Brilenta, and drug eluting stent.  Procedure description: The patient received Angiomax bolus and infusion with an ACT of 435. A total of 185 mL of contrast was administered to the patient. Using a 6 [place] XB 3.5 similar guide catheter along with a 1/190 cm long pro-water guidewire and a 2 mm x 12 mm Euphora balloon the lesion was crossed and angioplasty performed. The door to balloon time was 26 minutes. Following this A 2.75 mm x 23 mm long Xience drug-eluting stent was then carefully positioned and deployed at 12 atm and postdilated with a 3 mm x 20 mm long [place] Euphora balloon at 15 atm (3.1 mm) resulting in reduction with total occlusion to 0% residual with TIMI-3 flow and no dissection. The patient tolerated the procedure well. There  are no hemodynamic or electrocardiographic sequela. The guidewire and catheter were removed and the sheath was secured.   Final impression: successful PCI and stenting of a occluded mid codominant circumflex in the setting of an acute inferolateral myocardial infarction with a door to balloon time 26 minutes using Angiomax, Proventil and drug-eluting stent. The patient had noncritical disease otherwise with preserved LV function. He'll be treated with usual medications including beta blocker, statin drug and ACE inhibitor. His sheath will be removed in several hours and pressure held. He will be fast tracked with anticipated discharge in 48-72 hours. He left the lab in stable condition.   11/2014 echo Study Conclusions  - Left ventricle: The cavity size was normal. Wall thickness was increased in a pattern of mild LVH. Systolic function was normal. The estimated ejection fraction was in the range of 55% to 60%. Images were inadequate for LV wall motion assessment. However, no gross regional variation on available images. Diastolic dysfunction, grade indeterminate. - Aortic valve: Mildly to moderately calcified annulus. Mildly calcified leaflets. - Mitral valve: There was trivial regurgitation.  03/2015 PFTs: technically difficult due to poor patient cooperation. Was some evidence of obstruction.       Assessment and Plan   1. CAD - no recent symptoms - will require stress testing prior to upcoming surgery for risk stratification.   2. Preoperative evaluation - being considered for vascular surgery -poor exercise tolerance, primarily due to leg pains - we will reorder lexiscsn stress for him.   3. PAD/Preoperative evaluation - possible lower extremity bypass, await nuclear stress results regarding cardiac clearance.   4. Afib - new diagnosis, noted at time of prior stress test. Test was cancelled as patient was in afib with RVR, new finding for him - EKG  today shows sinus rhythm - stop aspirin, start eliquis. Would need to hold 3 days prior to vascular surgery.   Clearance pending results of nuclear stress test    Arnoldo Lenis, M.D.

## 2017-09-27 NOTE — Patient Instructions (Addendum)
Medication Instructions:  Your physician has recommended you make the following change in your medication:    STOP Aspirin    START Eliquis 5 mg twice daily   Please continue all other medications as prescribed  Labwork: NONE  Testing/Procedures: Your physician has requested that you have a lexiscan myoview. For further information please visit [email or web address]. Please follow instruction sheet, as given.  Follow-Up: Your physician recommends that you schedule a follow-up appointment PENDING TEST RESULTS  Any Other Special Instructions Will Be Listed Below (If Applicable).  If you need a refill on your cardiac medications before your next appointment, please call your pharmacy.

## 2017-10-03 ENCOUNTER — Other Ambulatory Visit: Payer: Self-pay

## 2017-10-03 ENCOUNTER — Encounter (HOSPITAL_COMMUNITY)
Admission: RE | Admit: 2017-10-03 | Discharge: 2017-10-03 | Disposition: A | Discharge status: Home / Self Care | Payer: Medicare Other | Source: Ambulatory Visit | Attending: Cardiology | Admitting: Cardiology

## 2017-10-03 ENCOUNTER — Encounter (HOSPITAL_BASED_OUTPATIENT_CLINIC_OR_DEPARTMENT_OTHER)
Admission: RE | Admit: 2017-10-03 | Discharge: 2017-10-03 | Disposition: A | Discharge status: Home / Self Care | Payer: Medicare Other | Source: Ambulatory Visit | Attending: Cardiology | Admitting: Cardiology

## 2017-10-03 ENCOUNTER — Encounter (HOSPITAL_COMMUNITY): Payer: Self-pay

## 2017-10-03 DIAGNOSIS — R0602 Shortness of breath: Secondary | ICD-10-CM | POA: Diagnosis not present

## 2017-10-03 DIAGNOSIS — I2510 Atherosclerotic heart disease of native coronary artery without angina pectoris: Secondary | ICD-10-CM | POA: Insufficient documentation

## 2017-10-03 DIAGNOSIS — R9439 Abnormal result of other cardiovascular function study: Secondary | ICD-10-CM | POA: Diagnosis not present

## 2017-10-03 LAB — NM MYOCAR MULTI W/SPECT W/WALL MOTION / EF
CHL CUP RESTING HR STRESS: 52 {beats}/min
CSEPPHR: 60 {beats}/min
LVDIAVOL: 149 mL (ref 62–150)
LVSYSVOL: 89 mL
RATE: 0.45
SDS: 6
SRS: 16
SSS: 22
TID: 1.1

## 2017-10-03 MED ORDER — TECHNETIUM TC 99M TETROFOSMIN IV KIT
30.0000 | PACK | Freq: Once | INTRAVENOUS | Status: AC | PRN
  Administered 2017-10-03: 31 via INTRAVENOUS

## 2017-10-03 MED ORDER — REGADENOSON 0.4 MG/5ML IV SOLN
INTRAVENOUS | Status: AC
  Administered 2017-10-03: 0.4 mg via INTRAVENOUS
  Filled 2017-10-03: qty 5

## 2017-10-03 MED ORDER — TECHNETIUM TC 99M TETROFOSMIN IV KIT
10.0000 | PACK | Freq: Once | INTRAVENOUS | Status: AC | PRN
Start: 2017-10-03 — End: 2017-10-03
  Administered 2017-10-03: 10.5 via INTRAVENOUS

## 2017-10-03 MED ORDER — PANTOPRAZOLE SODIUM 40 MG PO TBEC
DELAYED_RELEASE_TABLET | ORAL | 6 refills | Status: DC
Start: 2017-10-03 — End: 2018-05-11

## 2017-10-03 MED ORDER — SODIUM CHLORIDE 0.9% FLUSH
INTRAVENOUS | Status: AC
  Administered 2017-10-03: 10 mL via INTRAVENOUS
  Filled 2017-10-03: qty 10

## 2017-10-04 ENCOUNTER — Telehealth: Payer: Self-pay | Admitting: *Deleted

## 2017-10-04 ENCOUNTER — Encounter: Payer: Self-pay | Admitting: *Deleted

## 2017-10-04 ENCOUNTER — Encounter: Payer: Self-pay | Admitting: Cardiology

## 2017-10-04 NOTE — Telephone Encounter (Signed)
Confirmed received instructions for 10/26/17 procedure [facility]. They will review with patient and arrange transportation and make every effort to get patient to this surgery.

## 2017-10-04 NOTE — Progress Notes (Signed)
Nurses note entered on 10/04/17 @9 :44 error. Wrong chart.

## 2017-10-04 NOTE — Telephone Encounter (Signed)
NO NOTE

## 2017-10-05 ENCOUNTER — Telehealth: Payer: Self-pay

## 2017-10-05 DIAGNOSIS — I25119 Atherosclerotic heart disease of native coronary artery with unspecified angina pectoris: Secondary | ICD-10-CM

## 2017-10-05 NOTE — Telephone Encounter (Signed)
-----   Message from Antoine PocheJonathan F Branch, MD sent at 10/04/2017  1:30 PM EDT ----- Stress test shows primarily old damage to his heart, small area of a current blockage. It does suggest the pumping function of his heart may be decreased. He needs an echo for CAD to better evaluate and also complete his clearance for possible surgery. Try to get done as early as we can so he can to try to clear him for surgery  Dominga FerryJ Branch MD

## 2017-10-05 NOTE — Telephone Encounter (Signed)
Spoke with pt. Informed him of results. He voiced understanding. Will notify front office to schedule echo.

## 2017-10-06 ENCOUNTER — Ambulatory Visit (HOSPITAL_COMMUNITY)
Admission: RE | Admit: 2017-10-06 | Discharge: 2017-10-06 | Disposition: A | Discharge status: Home / Self Care | Payer: Medicare Other | Source: Ambulatory Visit | Attending: Cardiology | Admitting: Cardiology

## 2017-10-06 DIAGNOSIS — I25119 Atherosclerotic heart disease of native coronary artery with unspecified angina pectoris: Secondary | ICD-10-CM

## 2017-10-06 DIAGNOSIS — I739 Peripheral vascular disease, unspecified: Secondary | ICD-10-CM | POA: Diagnosis not present

## 2017-10-06 DIAGNOSIS — I2510 Atherosclerotic heart disease of native coronary artery without angina pectoris: Secondary | ICD-10-CM | POA: Insufficient documentation

## 2017-10-06 DIAGNOSIS — I213 ST elevation (STEMI) myocardial infarction of unspecified site: Secondary | ICD-10-CM | POA: Insufficient documentation

## 2017-10-06 DIAGNOSIS — I10 Essential (primary) hypertension: Secondary | ICD-10-CM | POA: Insufficient documentation

## 2017-10-06 DIAGNOSIS — L02619 Cutaneous abscess of unspecified foot: Secondary | ICD-10-CM | POA: Insufficient documentation

## 2017-10-06 DIAGNOSIS — Z720 Tobacco use: Secondary | ICD-10-CM | POA: Diagnosis not present

## 2017-10-06 NOTE — Progress Notes (Signed)
*  PRELIMINARY RESULTS* Echocardiogram 2D Echocardiogram has been performed.  Stacey DrainWhite, Bridgitte Felicetti J 10/06/2017, 9:14 AM

## 2017-10-10 ENCOUNTER — Telehealth: Payer: Self-pay | Admitting: *Deleted

## 2017-10-10 NOTE — Telephone Encounter (Signed)
-----   Message from Antoine PocheJonathan F Branch, MD sent at 10/09/2017  2:51 PM EDT ----- Echo looks fine, I am going to clear him for his surgery. Sending the forms today from [place] office  Dominga FerryJ Branch MD

## 2017-10-10 NOTE — Telephone Encounter (Signed)
Pt aware and appreciative - routed to pcp

## 2017-10-16 ENCOUNTER — Other Ambulatory Visit: Payer: Self-pay | Admitting: *Deleted

## 2017-10-27 ENCOUNTER — Other Ambulatory Visit (HOSPITAL_COMMUNITY): Payer: Medicare Other

## 2017-11-01 ENCOUNTER — Encounter (HOSPITAL_COMMUNITY)
Admission: RE | Disposition: A | Discharge status: Home Health | Payer: Self-pay | Source: Ambulatory Visit | Attending: Vascular Surgery

## 2017-11-01 ENCOUNTER — Inpatient Hospital Stay (HOSPITAL_COMMUNITY)
Admission: RE | Admit: 2017-11-01 | Discharge: 2017-11-04 | DRG: 254 | Disposition: A | Discharge status: Home Health | Payer: Medicare Other | Source: Ambulatory Visit | Attending: Vascular Surgery | Admitting: Vascular Surgery

## 2017-11-01 ENCOUNTER — Other Ambulatory Visit: Payer: Self-pay

## 2017-11-01 ENCOUNTER — Encounter (HOSPITAL_COMMUNITY): Payer: Self-pay | Admitting: Vascular Surgery

## 2017-11-01 DIAGNOSIS — I70235 Atherosclerosis of native arteries of right leg with ulceration of other part of foot: Secondary | ICD-10-CM | POA: Diagnosis present

## 2017-11-01 DIAGNOSIS — I129 Hypertensive chronic kidney disease with stage 1 through stage 4 chronic kidney disease, or unspecified chronic kidney disease: Secondary | ICD-10-CM | POA: Diagnosis present

## 2017-11-01 DIAGNOSIS — I252 Old myocardial infarction: Secondary | ICD-10-CM | POA: Diagnosis not present

## 2017-11-01 DIAGNOSIS — G8929 Other chronic pain: Secondary | ICD-10-CM | POA: Diagnosis present

## 2017-11-01 DIAGNOSIS — F419 Anxiety disorder, unspecified: Secondary | ICD-10-CM | POA: Diagnosis present

## 2017-11-01 DIAGNOSIS — E785 Hyperlipidemia, unspecified: Secondary | ICD-10-CM | POA: Diagnosis present

## 2017-11-01 DIAGNOSIS — Z7982 Long term (current) use of aspirin: Secondary | ICD-10-CM | POA: Diagnosis not present

## 2017-11-01 DIAGNOSIS — Z79891 Long term (current) use of opiate analgesic: Secondary | ICD-10-CM

## 2017-11-01 DIAGNOSIS — Z7902 Long term (current) use of antithrombotics/antiplatelets: Secondary | ICD-10-CM | POA: Diagnosis not present

## 2017-11-01 DIAGNOSIS — I70201 Unspecified atherosclerosis of native arteries of extremities, right leg: Secondary | ICD-10-CM | POA: Diagnosis present

## 2017-11-01 DIAGNOSIS — I2510 Atherosclerotic heart disease of native coronary artery without angina pectoris: Secondary | ICD-10-CM | POA: Diagnosis present

## 2017-11-01 DIAGNOSIS — N189 Chronic kidney disease, unspecified: Secondary | ICD-10-CM | POA: Diagnosis present

## 2017-11-01 DIAGNOSIS — L97519 Non-pressure chronic ulcer of other part of right foot with unspecified severity: Secondary | ICD-10-CM | POA: Diagnosis present

## 2017-11-01 DIAGNOSIS — I998 Other disorder of circulatory system: Secondary | ICD-10-CM | POA: Diagnosis not present

## 2017-11-01 DIAGNOSIS — I739 Peripheral vascular disease, unspecified: Secondary | ICD-10-CM | POA: Diagnosis present

## 2017-11-01 DIAGNOSIS — F17210 Nicotine dependence, cigarettes, uncomplicated: Secondary | ICD-10-CM | POA: Diagnosis present

## 2017-11-01 DIAGNOSIS — Z79899 Other long term (current) drug therapy: Secondary | ICD-10-CM

## 2017-11-01 DIAGNOSIS — Z8673 Personal history of transient ischemic attack (TIA), and cerebral infarction without residual deficits: Secondary | ICD-10-CM

## 2017-11-01 HISTORY — PX: ABDOMINAL AORTOGRAM W/LOWER EXTREMITY: CATH118223

## 2017-11-01 LAB — URINALYSIS, ROUTINE W REFLEX MICROSCOPIC
BILIRUBIN URINE: NEGATIVE
Bacteria, UA: NONE SEEN
Glucose, UA: NEGATIVE mg/dL
Ketones, ur: NEGATIVE mg/dL
Leukocytes, UA: NEGATIVE
Nitrite: NEGATIVE
PH: 6 (ref 5.0–8.0)
Protein, ur: NEGATIVE mg/dL
Specific Gravity, Urine: 1.015 (ref 1.005–1.030)

## 2017-11-01 LAB — POCT I-STAT, CHEM 8
BUN: 34 mg/dL — AB (ref 6–20)
CHLORIDE: 101 mmol/L (ref 98–111)
Calcium, Ion: 1.19 mmol/L (ref 1.15–1.40)
Creatinine, Ser: 2.1 mg/dL — ABNORMAL HIGH (ref 0.61–1.24)
GLUCOSE: 111 mg/dL — AB (ref 70–99)
HEMATOCRIT: 48 % (ref 39.0–52.0)
Hemoglobin: 16.3 g/dL (ref 13.0–17.0)
POTASSIUM: 3.7 mmol/L (ref 3.5–5.1)
Sodium: 136 mmol/L (ref 135–145)
TCO2: 22 mmol/L (ref 22–32)

## 2017-11-01 LAB — CBC
HEMATOCRIT: 46.7 % (ref 39.0–52.0)
HEMATOCRIT: 45.5 % (ref 39.0–52.0)
HEMOGLOBIN: 15.6 g/dL (ref 13.0–17.0)
HEMOGLOBIN: 15 g/dL (ref 13.0–17.0)
MCH: 30.8 pg (ref 26.0–34.0)
MCH: 30.5 pg (ref 26.0–34.0)
MCHC: 33.4 g/dL (ref 30.0–36.0)
MCHC: 33 g/dL (ref 30.0–36.0)
MCV: 92.1 fL (ref 78.0–100.0)
MCV: 92.5 fL (ref 78.0–100.0)
Platelets: 175 10*3/uL (ref 150–400)
Platelets: 165 10*3/uL (ref 150–400)
RBC: 5.07 MIL/uL (ref 4.22–5.81)
RBC: 4.92 MIL/uL (ref 4.22–5.81)
RDW: 13.6 % (ref 11.5–15.5)
RDW: 13.7 % (ref 11.5–15.5)
WBC: 9.8 10*3/uL (ref 4.0–10.5)
WBC: 10.6 10*3/uL — AB (ref 4.0–10.5)

## 2017-11-01 LAB — ABO/RH: ABO/RH(D): O POS

## 2017-11-01 LAB — PROTIME-INR
INR: 1.01
Prothrombin Time: 13.2 seconds (ref 11.4–15.2)

## 2017-11-01 LAB — COMPREHENSIVE METABOLIC PANEL
ALBUMIN: 3.6 g/dL (ref 3.5–5.0)
ALT: 21 U/L (ref 0–44)
AST: 29 U/L (ref 15–41)
Alkaline Phosphatase: 60 U/L (ref 38–126)
Anion gap: 8 (ref 5–15)
BILIRUBIN TOTAL: 0.6 mg/dL (ref 0.3–1.2)
BUN: 27 mg/dL — AB (ref 6–20)
CO2: 22 mmol/L (ref 22–32)
CREATININE: 1.68 mg/dL — AB (ref 0.61–1.24)
Calcium: 8.9 mg/dL (ref 8.9–10.3)
Chloride: 105 mmol/L (ref 98–111)
GFR calc Af Amer: 50 mL/min — ABNORMAL LOW (ref >60)
GFR, EST NON AFRICAN AMERICAN: 43 mL/min — AB (ref >60)
GLUCOSE: 106 mg/dL — AB (ref 70–99)
Potassium: 4.4 mmol/L (ref 3.5–5.1)
Sodium: 135 mmol/L (ref 135–145)
TOTAL PROTEIN: 6.8 g/dL (ref 6.5–8.1)

## 2017-11-01 LAB — APTT: APTT: 29 s (ref 24–36)

## 2017-11-01 LAB — TYPE AND SCREEN
ABO/RH(D): O POS
ANTIBODY SCREEN: NEGATIVE

## 2017-11-01 LAB — CREATININE, SERUM
CREATININE: 1.7 mg/dL — AB (ref 0.61–1.24)
GFR calc Af Amer: 49 mL/min — ABNORMAL LOW (ref >60)
GFR, EST NON AFRICAN AMERICAN: 42 mL/min — AB (ref >60)

## 2017-11-01 SURGERY — ABDOMINAL AORTOGRAM W/LOWER EXTREMITY

## 2017-11-01 MED ORDER — ATORVASTATIN CALCIUM 80 MG PO TABS
80.0000 mg | ORAL_TABLET | Freq: Every day | ORAL | Status: DC
  Administered 2017-11-03 – 2017-11-04 (×2): 80 mg via ORAL
  Filled 2017-11-01 (×3): qty 1

## 2017-11-01 MED ORDER — ONDANSETRON HCL 4 MG/2ML IJ SOLN
4.0000 mg | Freq: Four times a day (QID) | INTRAMUSCULAR | Status: DC | PRN
  Administered 2017-11-02: 4 mg via INTRAVENOUS

## 2017-11-01 MED ORDER — METOPROLOL TARTRATE 25 MG PO TABS
25.0000 mg | ORAL_TABLET | Freq: Two times a day (BID) | ORAL | Status: DC
  Administered 2017-11-01 – 2017-11-04 (×6): 25 mg via ORAL
  Filled 2017-11-01 (×6): qty 1

## 2017-11-01 MED ORDER — ACETAMINOPHEN 325 MG PO TABS: 650.0000 mg | ORAL_TABLET | ORAL | Status: DC | PRN

## 2017-11-01 MED ORDER — SODIUM CHLORIDE 0.9 % IV SOLN: INTRAVENOUS | Status: AC

## 2017-11-01 MED ORDER — FENTANYL CITRATE (PF) 100 MCG/2ML IJ SOLN
INTRAMUSCULAR | Status: AC
  Filled 2017-11-01: qty 2

## 2017-11-01 MED ORDER — HEPARIN (PORCINE) IN NACL 1000-0.9 UT/500ML-% IV SOLN
INTRAVENOUS | Status: AC
  Filled 2017-11-01: qty 1000

## 2017-11-01 MED ORDER — ASPIRIN EC 81 MG PO TBEC
81.0000 mg | DELAYED_RELEASE_TABLET | Freq: Every day | ORAL | Status: DC
  Administered 2017-11-01 – 2017-11-04 (×3): 81 mg via ORAL
  Filled 2017-11-01 (×3): qty 1

## 2017-11-01 MED ORDER — OXYCODONE HCL 5 MG PO TABS
5.0000 mg | ORAL_TABLET | ORAL | Status: DC | PRN
  Administered 2017-11-01 – 2017-11-02 (×3): 10 mg via ORAL
  Filled 2017-11-01 (×3): qty 2

## 2017-11-01 MED ORDER — CHLORHEXIDINE GLUCONATE CLOTH 2 % EX PADS: 6.0000 | MEDICATED_PAD | Freq: Once | CUTANEOUS | Status: DC

## 2017-11-01 MED ORDER — HYDROMORPHONE HCL 1 MG/ML IJ SOLN
0.5000 mg | INTRAMUSCULAR | Status: DC | PRN
  Administered 2017-11-02: 1 mg via INTRAVENOUS
  Filled 2017-11-01: qty 1

## 2017-11-01 MED ORDER — CHLORHEXIDINE GLUCONATE CLOTH 2 % EX PADS
6.0000 | MEDICATED_PAD | Freq: Once | CUTANEOUS | Status: AC
  Administered 2017-11-01: 6 via TOPICAL

## 2017-11-01 MED ORDER — SODIUM CHLORIDE 0.9% FLUSH: 3.0000 mL | INTRAVENOUS | Status: DC | PRN

## 2017-11-01 MED ORDER — GABAPENTIN 600 MG PO TABS
300.0000 mg | ORAL_TABLET | Freq: Three times a day (TID) | ORAL | Status: DC
  Administered 2017-11-01 – 2017-11-04 (×7): 300 mg via ORAL
  Filled 2017-11-01 (×7): qty 1

## 2017-11-01 MED ORDER — PANTOPRAZOLE SODIUM 40 MG PO TBEC
40.0000 mg | DELAYED_RELEASE_TABLET | Freq: Every day | ORAL | Status: DC
  Administered 2017-11-03: 40 mg via ORAL
  Filled 2017-11-01 (×2): qty 1

## 2017-11-01 MED ORDER — CHLORHEXIDINE GLUCONATE 4 % EX LIQD
60.0000 mL | Freq: Once | CUTANEOUS | Status: DC
  Filled 2017-11-01: qty 30

## 2017-11-01 MED ORDER — SODIUM CHLORIDE 0.9 % IV SOLN
INTRAVENOUS | Status: DC
  Administered 2017-11-02: 01:00:00 via INTRAVENOUS

## 2017-11-01 MED ORDER — SODIUM CHLORIDE 0.9 % IV SOLN: 250.0000 mL | INTRAVENOUS | Status: DC | PRN

## 2017-11-01 MED ORDER — HYDRALAZINE HCL 20 MG/ML IJ SOLN: 5.0000 mg | INTRAMUSCULAR | Status: DC | PRN

## 2017-11-01 MED ORDER — LISINOPRIL 10 MG PO TABS: 10.0000 mg | ORAL_TABLET | Freq: Every day | ORAL | Status: DC

## 2017-11-01 MED ORDER — MIDAZOLAM HCL 2 MG/2ML IJ SOLN
INTRAMUSCULAR | Status: AC
  Filled 2017-11-01: qty 2

## 2017-11-01 MED ORDER — SODIUM CHLORIDE 0.9 % IR SOLN: 200.0000 mL | Freq: Every day | Status: DC

## 2017-11-01 MED ORDER — FENTANYL CITRATE (PF) 100 MCG/2ML IJ SOLN
INTRAMUSCULAR | Status: DC | PRN
  Administered 2017-11-01 (×2): 25 ug via INTRAVENOUS

## 2017-11-01 MED ORDER — MIDAZOLAM HCL 2 MG/2ML IJ SOLN
INTRAMUSCULAR | Status: DC | PRN
  Administered 2017-11-01: 1 mg via INTRAVENOUS

## 2017-11-01 MED ORDER — CHLORHEXIDINE GLUCONATE 4 % EX LIQD
60.0000 mL | Freq: Once | CUTANEOUS | Status: AC
  Administered 2017-11-01: 4 via TOPICAL
  Filled 2017-11-01: qty 30

## 2017-11-01 MED ORDER — ALPRAZOLAM 0.5 MG PO TABS
0.5000 mg | ORAL_TABLET | Freq: Three times a day (TID) | ORAL | Status: DC | PRN
  Administered 2017-11-02: 0.5 mg via ORAL
  Filled 2017-11-01: qty 1

## 2017-11-01 MED ORDER — HEPARIN SODIUM (PORCINE) 5000 UNIT/ML IJ SOLN
5000.0000 [IU] | Freq: Three times a day (TID) | INTRAMUSCULAR | Status: DC
  Administered 2017-11-01 – 2017-11-02 (×2): 5000 [IU] via SUBCUTANEOUS
  Filled 2017-11-01 (×2): qty 1

## 2017-11-01 MED ORDER — LABETALOL HCL 5 MG/ML IV SOLN: 10.0000 mg | INTRAVENOUS | Status: DC | PRN

## 2017-11-01 MED ORDER — SODIUM CHLORIDE 0.9% FLUSH
3.0000 mL | Freq: Two times a day (BID) | INTRAVENOUS | Status: DC
  Administered 2017-11-01 (×2): 3 mL via INTRAVENOUS

## 2017-11-01 MED ORDER — LIDOCAINE HCL (PF) 1 % IJ SOLN
INTRAMUSCULAR | Status: DC | PRN
  Administered 2017-11-01: 15 mL

## 2017-11-01 MED ORDER — SODIUM CHLORIDE 0.9 % IV SOLN
INTRAVENOUS | Status: DC
  Administered 2017-11-01: 07:00:00 via INTRAVENOUS

## 2017-11-01 MED ORDER — LIDOCAINE HCL (PF) 1 % IJ SOLN
INTRAMUSCULAR | Status: AC
  Filled 2017-11-01: qty 30

## 2017-11-01 MED ORDER — CEFAZOLIN SODIUM-DEXTROSE 2-4 GM/100ML-% IV SOLN
2.0000 g | INTRAVENOUS | Status: DC
  Filled 2017-11-01: qty 100

## 2017-11-01 MED ORDER — ALBUTEROL SULFATE (2.5 MG/3ML) 0.083% IN NEBU: 2.5000 mg | INHALATION_SOLUTION | Freq: Four times a day (QID) | RESPIRATORY_TRACT | Status: DC | PRN

## 2017-11-01 MED ORDER — BUPROPION HCL ER (XL) 300 MG PO TB24
300.0000 mg | ORAL_TABLET | Freq: Every day | ORAL | Status: DC
  Administered 2017-11-03 – 2017-11-04 (×2): 300 mg via ORAL
  Filled 2017-11-01 (×3): qty 1

## 2017-11-01 MED ORDER — HEPARIN (PORCINE) IN NACL 1000-0.9 UT/500ML-% IV SOLN
INTRAVENOUS | Status: DC | PRN
  Administered 2017-11-01 (×2): 500 mL

## 2017-11-01 SURGICAL SUPPLY — 12 items
CATH OMNI FLUSH 5F 65CM (CATHETERS) ×3 IMPLANT
FILTER CO2 0.2 MICRON (VASCULAR PRODUCTS) ×3 IMPLANT
KIT MICROPUNCTURE NIT STIFF (SHEATH) ×3 IMPLANT
KIT PV (KITS) ×3 IMPLANT
RESERVOIR CO2 (VASCULAR PRODUCTS) ×3 IMPLANT
SET FLUSH CO2 (MISCELLANEOUS) ×3 IMPLANT
SHEATH PINNACLE 5F 10CM (SHEATH) ×3 IMPLANT
SHEATH PROBE COVER 6X72 (BAG) ×3 IMPLANT
SYR MEDRAD MARK V 150ML (SYRINGE) ×3 IMPLANT
TRANSDUCER W/STOPCOCK (MISCELLANEOUS) ×3 IMPLANT
TRAY PV CATH (CUSTOM PROCEDURE TRAY) ×3 IMPLANT
WIRE BENTSON .035X145CM (WIRE) ×3 IMPLANT
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
## 2017-11-01 NOTE — Progress Notes (Signed)
Victorino DikeJennifer RN / cath lab was contacted regarding need for H&P and informed her of creat/ BUN lab values. No further orders followed.

## 2017-11-01 NOTE — Progress Notes (Signed)
Patient arrived from the cath lab after RLE angiogram to 4E room 14.  Patient oriented to unit and room to include call light and phone.  Patient is on bedrest until approximately 2 pm.  Will continue to monitor.

## 2017-11-01 NOTE — Progress Notes (Signed)
Site area: Right groin a 5 french arterial sheath was removed  Site Prior to Removal:  Level 0  Pressure Applied For 20 MINUTES    Bedrest Beginning at  0945am  Manual:   Yes.    Patient Status During Pull:  stable  Post Pull Groin Site:  Level 0  Post Pull Instructions Given:  Yes.    Post Pull Pulses Present:  Yes.    Dressing Applied:  Yes.    Comments:  VS remain stable 

## 2017-11-01 NOTE — Op Note (Signed)
    Patient name: Eugene PingJoseph Marion Woodle II MRN: [id number] DOB: 04/25/1960 Sex: male  11/01/2017 Pre-operative Diagnosis: Critical right lower extremity ischemia, chronic kidney disease Post-operative diagnosis:  Same Surgeon:  Luanna SalkBrandon C. Randie Heinzain, MD Procedure Performed: 1.  Ultrasound-guided cannulation right common femoral artery 2.  Right lower extremity angiogram with CO2  3.  moderate sedation with fentanyl and Versed 17mins  Indications: 57 year old male with a history of right lower extremity wound.  He is undergone stenting of his SFA which is known to be occluded.  The wound remained stable he was previously scheduled for right femoral to popliteal artery bypass but that was canceled.  For now performing angiogram in preparation for the bypass surgery.  Findings: Common femoral artery appears free of disease profunda femoris is patent there is an occluded stent at the takeoff of the SFA on the right.  SFA reconstitutes above the knee popliteal artery quite distally there appears to be runoff via the anterior tibial artery only to the level of the foot.  Next  Patient will be taken to the OR tomorrow for right femoral to popliteal artery bypass grafting with vein versus graft.   Procedure:  The patient was identified in the holding area and taken to room 8.  The patient was then placed supine on the table and prepped and draped in the usual sterile fashion.  A time out was called.  Ultrasound was used to evaluate the right common femoral artery.  It was patent .  A digital ultrasound image was acquired and saved to the permanent record..  A micropuncture needle was used to access the right common femoral artery under ultrasound guidance.  An 018 wire was advanced without resistance and a micropuncture sheath was placed.  The 018 wire was removed and a benson wire was placed.  The micropuncture sheath was exchanged for a 5 french sheath.  Right lower extremity angiography with CO2 was then  performed.  With the above findings we will plan bypass surgery tomorrow.  He tolerated this procedure well without immediate comp occasion.  Next  Contrast: 0 cc.   Antoninette Lerner C. Randie Heinzain, MD [facility] [place] Office: [phone] Pager: [phone]

## 2017-11-02 ENCOUNTER — Inpatient Hospital Stay (HOSPITAL_COMMUNITY): Payer: Medicare Other | Admitting: Certified Registered Nurse Anesthetist

## 2017-11-02 ENCOUNTER — Inpatient Hospital Stay (HOSPITAL_COMMUNITY): Admission: RE | Admit: 2017-11-02 | Payer: Medicare Other | Source: Ambulatory Visit | Admitting: Vascular Surgery

## 2017-11-02 ENCOUNTER — Encounter (HOSPITAL_COMMUNITY)
Admission: RE | Disposition: A | Discharge status: Home Health | Payer: Self-pay | Source: Ambulatory Visit | Attending: Vascular Surgery

## 2017-11-02 ENCOUNTER — Encounter (HOSPITAL_COMMUNITY): Payer: Self-pay | Admitting: *Deleted

## 2017-11-02 HISTORY — PX: FEMORAL-POPLITEAL BYPASS GRAFT: SHX937

## 2017-11-02 HISTORY — PX: ENDARTERECTOMY FEMORAL: SHX5804

## 2017-11-02 HISTORY — PX: VEIN HARVEST: SHX6363

## 2017-11-02 LAB — CBC
HEMATOCRIT: 41.9 % (ref 39.0–52.0)
Hemoglobin: 14 g/dL (ref 13.0–17.0)
MCH: 30.6 pg (ref 26.0–34.0)
MCHC: 33.4 g/dL (ref 30.0–36.0)
MCV: 91.7 fL (ref 78.0–100.0)
Platelets: 154 10*3/uL (ref 150–400)
RBC: 4.57 MIL/uL (ref 4.22–5.81)
RDW: 13.6 % (ref 11.5–15.5)
WBC: 8.4 10*3/uL (ref 4.0–10.5)

## 2017-11-02 LAB — MRSA PCR SCREENING: MRSA BY PCR: NEGATIVE

## 2017-11-02 LAB — BASIC METABOLIC PANEL
Anion gap: 5 (ref 5–15)
BUN: 15 mg/dL (ref 6–20)
CHLORIDE: 104 mmol/L (ref 98–111)
CO2: 28 mmol/L (ref 22–32)
Calcium: 8.6 mg/dL — ABNORMAL LOW (ref 8.9–10.3)
Creatinine, Ser: 1.15 mg/dL (ref 0.61–1.24)
GFR calc Af Amer: >60 mL/min (ref >60)
GFR calc non Af Amer: >60 mL/min (ref >60)
Glucose, Bld: 106 mg/dL — ABNORMAL HIGH (ref 70–99)
Potassium: 4.3 mmol/L (ref 3.5–5.1)
SODIUM: 137 mmol/L (ref 135–145)

## 2017-11-02 LAB — APTT: APTT: 27 s (ref 24–36)

## 2017-11-02 SURGERY — BYPASS GRAFT FEMORAL-POPLITEAL ARTERY
Anesthesia: General | Laterality: Right

## 2017-11-02 MED ORDER — DOCUSATE SODIUM 100 MG PO CAPS
100.0000 mg | ORAL_CAPSULE | Freq: Every day | ORAL | Status: DC
  Administered 2017-11-03: 100 mg via ORAL
  Filled 2017-11-02: qty 1

## 2017-11-02 MED ORDER — METOPROLOL TARTRATE 5 MG/5ML IV SOLN: 2.0000 mg | INTRAVENOUS | Status: DC | PRN

## 2017-11-02 MED ORDER — POTASSIUM CHLORIDE CRYS ER 20 MEQ PO TBCR: 20.0000 meq | EXTENDED_RELEASE_TABLET | Freq: Every day | ORAL | Status: DC | PRN

## 2017-11-02 MED ORDER — ACETAMINOPHEN 325 MG RE SUPP: 325.0000 mg | RECTAL | Status: DC | PRN

## 2017-11-02 MED ORDER — LACTATED RINGERS IV SOLN
INTRAVENOUS | Status: DC
  Administered 2017-11-02 (×2): via INTRAVENOUS

## 2017-11-02 MED ORDER — PHENOL 1.4 % MT LIQD: 1.0000 | OROMUCOSAL | Status: DC | PRN

## 2017-11-02 MED ORDER — HEPARIN SODIUM (PORCINE) 1000 UNIT/ML IJ SOLN
INTRAMUSCULAR | Status: DC | PRN
  Administered 2017-11-02: 10000 [IU] via INTRAVENOUS
  Administered 2017-11-02: 5000 [IU] via INTRAVENOUS

## 2017-11-02 MED ORDER — PROTAMINE SULFATE 10 MG/ML IV SOLN
INTRAVENOUS | Status: DC | PRN
  Administered 2017-11-02: 40 mg via INTRAVENOUS
  Administered 2017-11-02: 10 mg via INTRAVENOUS

## 2017-11-02 MED ORDER — CEFAZOLIN SODIUM-DEXTROSE 2-4 GM/100ML-% IV SOLN: 2.0000 g | INTRAVENOUS | Status: DC

## 2017-11-02 MED ORDER — ACETAMINOPHEN 325 MG PO TABS
325.0000 mg | ORAL_TABLET | ORAL | Status: DC | PRN
Start: 2017-11-02 — End: 2017-11-04

## 2017-11-02 MED ORDER — OXYCODONE-ACETAMINOPHEN 5-325 MG PO TABS
1.0000 | ORAL_TABLET | ORAL | Status: DC | PRN
  Administered 2017-11-02 – 2017-11-04 (×8): 2 via ORAL
  Filled 2017-11-02 (×8): qty 2

## 2017-11-02 MED ORDER — ROCURONIUM BROMIDE 10 MG/ML (PF) SYRINGE
PREFILLED_SYRINGE | INTRAVENOUS | Status: DC | PRN
  Administered 2017-11-02: 70 mg via INTRAVENOUS
  Administered 2017-11-02: 20 mg via INTRAVENOUS

## 2017-11-02 MED ORDER — HYDROMORPHONE HCL 1 MG/ML IJ SOLN
0.2500 mg | INTRAMUSCULAR | Status: DC | PRN
  Administered 2017-11-02: 0.5 mg via INTRAVENOUS

## 2017-11-02 MED ORDER — MAGNESIUM SULFATE 2 GM/50ML IV SOLN: 2.0000 g | Freq: Every day | INTRAVENOUS | Status: DC | PRN

## 2017-11-02 MED ORDER — SODIUM CHLORIDE 0.9 % IV SOLN
INTRAVENOUS | Status: DC | PRN
  Administered 2017-11-02: 11:00:00

## 2017-11-02 MED ORDER — DEXAMETHASONE SODIUM PHOSPHATE 10 MG/ML IJ SOLN
INTRAMUSCULAR | Status: DC | PRN
  Administered 2017-11-02: 10 mg via INTRAVENOUS

## 2017-11-02 MED ORDER — FENTANYL CITRATE (PF) 250 MCG/5ML IJ SOLN
INTRAMUSCULAR | Status: AC
  Filled 2017-11-02: qty 5

## 2017-11-02 MED ORDER — ALBUTEROL SULFATE HFA 108 (90 BASE) MCG/ACT IN AERS
INHALATION_SPRAY | RESPIRATORY_TRACT | Status: DC | PRN
  Administered 2017-11-02: 2 via RESPIRATORY_TRACT

## 2017-11-02 MED ORDER — 0.9 % SODIUM CHLORIDE (POUR BTL) OPTIME
TOPICAL | Status: DC | PRN
  Administered 2017-11-02: 2000 mL

## 2017-11-02 MED ORDER — SODIUM CHLORIDE 0.9 % IV SOLN
INTRAVENOUS | Status: DC
  Administered 2017-11-02: 15:00:00 via INTRAVENOUS

## 2017-11-02 MED ORDER — SODIUM CHLORIDE 0.9 % IV SOLN: 500.0000 mL | Freq: Once | INTRAVENOUS | Status: DC | PRN

## 2017-11-02 MED ORDER — CEFAZOLIN SODIUM-DEXTROSE 2-3 GM-%(50ML) IV SOLR
INTRAVENOUS | Status: DC | PRN
  Administered 2017-11-02 (×2): 2 g via INTRAVENOUS

## 2017-11-02 MED ORDER — CEFAZOLIN SODIUM-DEXTROSE 2-4 GM/100ML-% IV SOLN
2.0000 g | Freq: Three times a day (TID) | INTRAVENOUS | Status: AC
  Administered 2017-11-02 (×2): 2 g via INTRAVENOUS
  Filled 2017-11-02 (×2): qty 100

## 2017-11-02 MED ORDER — PROMETHAZINE HCL 25 MG/ML IJ SOLN: 6.2500 mg | INTRAMUSCULAR | Status: DC | PRN

## 2017-11-02 MED ORDER — LIDOCAINE 2% (20 MG/ML) 5 ML SYRINGE
INTRAMUSCULAR | Status: DC | PRN
  Administered 2017-11-02: 100 mg via INTRAVENOUS

## 2017-11-02 MED ORDER — MIDAZOLAM HCL 5 MG/5ML IJ SOLN
INTRAMUSCULAR | Status: DC | PRN
  Administered 2017-11-02: 2 mg via INTRAVENOUS

## 2017-11-02 MED ORDER — POLYETHYLENE GLYCOL 3350 17 G PO PACK: 17.0000 g | PACK | Freq: Every day | ORAL | Status: DC | PRN

## 2017-11-02 MED ORDER — LABETALOL HCL 5 MG/ML IV SOLN: 10.0000 mg | INTRAVENOUS | Status: DC | PRN

## 2017-11-02 MED ORDER — HEPARIN SODIUM (PORCINE) 5000 UNIT/ML IJ SOLN
5000.0000 [IU] | Freq: Three times a day (TID) | INTRAMUSCULAR | Status: DC
  Administered 2017-11-03 – 2017-11-04 (×2): 5000 [IU] via SUBCUTANEOUS
  Filled 2017-11-02 (×3): qty 1

## 2017-11-02 MED ORDER — HYDRALAZINE HCL 20 MG/ML IJ SOLN: 5.0000 mg | INTRAMUSCULAR | Status: DC | PRN

## 2017-11-02 MED ORDER — FENTANYL CITRATE (PF) 250 MCG/5ML IJ SOLN
INTRAMUSCULAR | Status: DC | PRN
  Administered 2017-11-02: 100 ug via INTRAVENOUS
  Administered 2017-11-02 (×7): 50 ug via INTRAVENOUS

## 2017-11-02 MED ORDER — ALUM & MAG HYDROXIDE-SIMETH 200-200-20 MG/5ML PO SUSP
15.0000 mL | ORAL | Status: DC | PRN
  Administered 2017-11-03: 30 mL via ORAL
  Filled 2017-11-02: qty 30

## 2017-11-02 MED ORDER — SODIUM CHLORIDE 0.9 % IV SOLN: INTRAVENOUS | Status: DC | PRN

## 2017-11-02 MED ORDER — PHENYLEPHRINE HCL 10 MG/ML IJ SOLN
INTRAMUSCULAR | Status: DC | PRN
  Administered 2017-11-02: 40 ug via INTRAVENOUS

## 2017-11-02 MED ORDER — ONDANSETRON HCL 4 MG/2ML IJ SOLN: 4.0000 mg | Freq: Four times a day (QID) | INTRAMUSCULAR | Status: DC | PRN

## 2017-11-02 MED ORDER — SODIUM CHLORIDE 0.9 % IV SOLN: INTRAVENOUS | Status: DC

## 2017-11-02 MED ORDER — BISACODYL 10 MG RE SUPP: 10.0000 mg | Freq: Every day | RECTAL | Status: DC | PRN

## 2017-11-02 MED ORDER — GUAIFENESIN-DM 100-10 MG/5ML PO SYRP: 15.0000 mL | ORAL_SOLUTION | ORAL | Status: DC | PRN

## 2017-11-02 MED ORDER — PROPOFOL 10 MG/ML IV BOLUS
INTRAVENOUS | Status: DC | PRN
  Administered 2017-11-02: 130 mg via INTRAVENOUS

## 2017-11-02 MED ORDER — HEPARIN SODIUM (PORCINE) 5000 UNIT/ML IJ SOLN
INTRAMUSCULAR | Status: AC
  Filled 2017-11-02: qty 1.2

## 2017-11-02 MED ORDER — SUGAMMADEX SODIUM 200 MG/2ML IV SOLN
INTRAVENOUS | Status: DC | PRN
  Administered 2017-11-02: 200 mg via INTRAVENOUS

## 2017-11-02 SURGICAL SUPPLY — 53 items
BANDAGE ESMARK 6X9 LF (GAUZE/BANDAGES/DRESSINGS) IMPLANT
BNDG ESMARK 6X9 LF (GAUZE/BANDAGES/DRESSINGS)
CANISTER SUCT 3000ML PPV (MISCELLANEOUS) ×3 IMPLANT
CANNULA VESSEL 3MM 2 BLNT TIP (CANNULA) ×3 IMPLANT
CLIP VESOCCLUDE MED 24/CT (CLIP) ×3 IMPLANT
CLIP VESOCCLUDE SM WIDE 24/CT (CLIP) ×6 IMPLANT
CUFF TOURNIQUET SINGLE 24IN (TOURNIQUET CUFF) IMPLANT
CUFF TOURNIQUET SINGLE 34IN LL (TOURNIQUET CUFF) IMPLANT
CUFF TOURNIQUET SINGLE 44IN (TOURNIQUET CUFF) IMPLANT
DERMABOND ADVANCED (GAUZE/BANDAGES/DRESSINGS) ×6
DERMABOND ADVANCED .7 DNX12 (GAUZE/BANDAGES/DRESSINGS) ×3 IMPLANT
DRAIN CHANNEL 15F RND FF W/TCR (WOUND CARE) IMPLANT
DRAPE C-ARM 42X72 X-RAY (DRAPES) IMPLANT
DRAPE X-RAY CASS 24X20 (DRAPES) IMPLANT
ELECT REM PT RETURN 9FT ADLT (ELECTROSURGICAL) ×3
ELECTRODE REM PT RTRN 9FT ADLT (ELECTROSURGICAL) ×1 IMPLANT
EVACUATOR SILICONE 100CC (DRAIN) IMPLANT
GLOVE BIO SURGEON STRL SZ 6.5 (GLOVE) ×6 IMPLANT
GLOVE BIO SURGEON STRL SZ7.5 (GLOVE) ×3 IMPLANT
GLOVE BIO SURGEONS STRL SZ 6.5 (GLOVE) ×3
GLOVE BIOGEL PI IND STRL 7.0 (GLOVE) ×5 IMPLANT
GLOVE BIOGEL PI INDICATOR 7.0 (GLOVE) ×10
GLOVE ECLIPSE 6.5 STRL STRAW (GLOVE) ×9 IMPLANT
GOWN STRL REUS W/ TWL LRG LVL3 (GOWN DISPOSABLE) ×3 IMPLANT
GOWN STRL REUS W/ TWL XL LVL3 (GOWN DISPOSABLE) ×2 IMPLANT
GOWN STRL REUS W/TWL LRG LVL3 (GOWN DISPOSABLE) ×6
GOWN STRL REUS W/TWL XL LVL3 (GOWN DISPOSABLE) ×4
HEMOSTAT SPONGE AVITENE ULTRA (HEMOSTASIS) IMPLANT
KIT BASIN OR (CUSTOM PROCEDURE TRAY) ×3 IMPLANT
KIT TURNOVER KIT B (KITS) ×3 IMPLANT
MARKER GRAFT CORONARY BYPASS (MISCELLANEOUS) IMPLANT
NS IRRIG 1000ML POUR BTL (IV SOLUTION) ×6 IMPLANT
PACK PERIPHERAL VASCULAR (CUSTOM PROCEDURE TRAY) ×3 IMPLANT
PAD ARMBOARD 7.5X6 YLW CONV (MISCELLANEOUS) ×6 IMPLANT
SET COLLECT BLD 21X3/4 12 (NEEDLE) IMPLANT
SPONGE LAP 18X18 X RAY DECT (DISPOSABLE) ×6 IMPLANT
STOPCOCK 4 WAY LG BORE MALE ST (IV SETS) IMPLANT
SUT ETHILON 3 0 PS 1 (SUTURE) IMPLANT
SUT MNCRL AB 4-0 PS2 18 (SUTURE) ×9 IMPLANT
SUT PROLENE 5 0 C 1 24 (SUTURE) ×6 IMPLANT
SUT PROLENE 6 0 BV (SUTURE) ×18 IMPLANT
SUT PROLENE 7 0 BV 1 (SUTURE) IMPLANT
SUT SILK 2 0 SH (SUTURE) ×3 IMPLANT
SUT SILK 3 0 (SUTURE) ×2
SUT SILK 3-0 18XBRD TIE 12 (SUTURE) ×1 IMPLANT
SUT VIC AB 2-0 CT1 27 (SUTURE) ×2
SUT VIC AB 2-0 CT1 TAPERPNT 27 (SUTURE) ×1 IMPLANT
SUT VIC AB 3-0 SH 27 (SUTURE) ×6
SUT VIC AB 3-0 SH 27X BRD (SUTURE) ×3 IMPLANT
TOWEL GREEN STERILE (TOWEL DISPOSABLE) ×3 IMPLANT
TRAY FOLEY MTR SLVR 16FR STAT (SET/KITS/TRAYS/PACK) ×3 IMPLANT
UNDERPAD 30X30 (UNDERPADS AND DIAPERS) ×3 IMPLANT
WATER STERILE IRR 1000ML POUR (IV SOLUTION) ×3 IMPLANT

## 2017-11-02 NOTE — Transfer of Care (Addendum)
Immediate Anesthesia Transfer of Care Note  Patient: Eugene Parker  Procedure(s) Performed: BYPASS GRAFT RIGHT FEMORAL TO BELOW KNEE POPLITEAL ARTERY USING RIGHT GREAT SAPHENOUS VEIN  (Right ) VEIN HARVEST RIGHT GREAT SAPHENOUS VEIN (Right ) ENDARTERECTOMY RIGHT FEMORAL ARTERY (Right )  Patient Location: PACU  Anesthesia Type:General  Level of Consciousness: awake, alert  and oriented  Airway & Oxygen Therapy: Patient Spontanous Breathing and Patient connected to nasal cannula oxygen  Post-op Assessment: Report given to RN and Post -op Vital signs reviewed and stable  Post vital signs: Reviewed and stable  Last Vitals:  Vitals Value Taken Time  BP 163/76 11/02/2017  1:44 PM  Temp    Pulse 76 11/02/2017  1:54 PM  Resp 17 11/02/2017  1:54 PM  SpO2 99 % 11/02/2017  1:54 PM  Vitals shown include unvalidated device data.  Last Pain:  Vitals:   11/02/17 1345  TempSrc:   PainSc: (P) 0-No pain         Complications: No apparent anesthesia complications

## 2017-11-02 NOTE — Progress Notes (Signed)
  Progress Note    11/02/2017 7:52 AM 1 Day Post-Op  Subjective: Ready for surgery  Vitals:   11/01/17 1949 11/02/17 0320  BP: 112/65 107/67  Pulse: 72 (!) 58  Resp: 20 18  Temp: 99 F (37.2 C) 98 F (36.7 C)  SpO2: 98% 97%    Physical Exam: Awake alert oriented Nonlabored respirations Abdomen is soft Right groin sheath site without hematoma and dressings clean dry and intact Palpable right femoral pulse Wound on right foot with dressing clean dry and intact, future DVT taken care of last night  CBC    Component Value Date/Time   WBC 8.4 11/02/2017 0238   RBC 4.57 11/02/2017 0238   HGB 14.0 11/02/2017 0238   HCT 41.9 11/02/2017 0238   PLT 154 11/02/2017 0238   MCV 91.7 11/02/2017 0238   MCH 30.6 11/02/2017 0238   MCHC 33.4 11/02/2017 0238   RDW 13.6 11/02/2017 0238   LYMPHSABS 3.0 03/22/2016 0325   MONOABS 1.1 (H) 03/22/2016 0325   EOSABS 0.6 03/22/2016 0325   BASOSABS 0.1 03/22/2016 0325    BMET    Component Value Date/Time   NA 137 11/02/2017 0238   K 4.3 11/02/2017 0238   CL 104 11/02/2017 0238   CO2 28 11/02/2017 0238   GLUCOSE 106 (H) 11/02/2017 0238   BUN 15 11/02/2017 0238   CREATININE 1.15 11/02/2017 0238   CALCIUM 8.6 (L) 11/02/2017 0238   GFRNONAA >60 11/02/2017 0238   GFRAA >60 11/02/2017 0238    INR    Component Value Date/Time   INR 1.01 11/01/2017 1208     Intake/Output Summary (Last 24 hours) at 11/02/2017 0752 Last data filed at 11/01/2017 1700 Gross per 24 hour  Intake 120 ml  Output 401 ml  Net -281 ml     Assessment:  57 y.o. male is s/p right lower extremity angiogram, he has a wound on his right foot  Plan: In OR today right femoral-popliteal artery bypass with vein versus graft.   I discussed risk and benefits as well as expected outcomes he demonstrates good understanding.   Demica Zook C. Randie Heinzain, MD [facility] [place] Office: [phone] Pager: [phone]  11/02/2017 7:52 AM

## 2017-11-02 NOTE — Discharge Instructions (Signed)
 [facility]  Discharge instructions  Lower Extremity Bypass Surgery  Please refer to the following instruction for your post-procedure care. Your surgeon or physician assistant will discuss any changes with you.  Activity  You are encouraged to walk as much as you can. You can slowly return to normal activities during the month after your surgery. Avoid strenuous activity and heavy lifting until your doctor tells you it's OK. Avoid activities such as vacuuming or swinging a golf club. Do not drive until your doctor give the OK and you are no longer taking prescription pain medications. It is also normal to have difficulty with sleep habits, eating and bowel movement after surgery. These will go away with time.  Bathing/Showering  Shower daily after you go home. Do not soak in a bathtub, hot tub, or swim until the incision heals completely.  Incision Care  Clean your incision with mild soap and water. Shower every day. Pat the area dry with a clean towel. You do not need a bandage unless otherwise instructed. Do not apply any ointments or creams to your incision. If you have open wounds you will be instructed how to care for them or a visiting nurse may be arranged for you. If you have staples or sutures along your incision they will be removed at your post-op appointment. You may have skin glue on your incision. Do not peel it off. It will come off on its own in about one week.  Wash the groin wound with soap and water daily and pat dry. (No tub bath-only shower)  Then put a dry gauze or washcloth in the groin to keep this area dry to help prevent wound infection.  Do this daily and as needed.  Do not use Vaseline or neosporin on your incisions.  Only use soap and water on your incisions and then protect and keep dry.  Diet  Resume your normal diet. There are no special food restrictions following this procedure. A low fat/ low cholesterol diet is  recommended for all patients with vascular disease. In order to heal from your surgery, it is CRITICAL to get adequate nutrition. Your body requires vitamins, minerals, and protein. Vegetables are the best source of vitamins and minerals. Vegetables also provide the perfect balance of protein. Processed food has little nutritional value, so try to avoid this.  Medications  Resume taking all your medications unless your doctor or physician assistant tells you not to. If your incision is causing pain, you may take over-the-counter pain relievers such as acetaminophen (Tylenol). If you were prescribed a stronger pain medication, please aware these medication can cause nausea and constipation. Prevent nausea by taking the medication with a snack or meal. Avoid constipation by drinking plenty of fluids and eating foods with high amount of fiber, such as fruits, vegetables, and grains. Take Colace 100 mg (an over-the-counter stool softener) twice a day as needed for constipation.  Do not take Tylenol if you are taking prescription pain medications.  Follow Up  Our office will schedule a follow up appointment 2-3 weeks following discharge.  Please call us immediately for any of the following conditions  Severe or worsening pain in your legs or feet while at rest or while walking Increase pain, redness, warmth, or drainage (pus) from your incision site(s) Fever of 101 degree or higher The swelling in your leg with the bypass suddenly worsens and becomes more painful than when you were in the hospital If you have   been instructed to feel your graft pulse then you should do so every day. If you can no longer feel this pulse, call the office immediately. Not all patients are given this instruction.  Leg swelling is common after leg bypass surgery.  The swelling should improve over a few months following surgery. To improve the swelling, you may elevate your legs above the level of your heart while you are  sitting or resting. Your surgeon or physician assistant may ask you to apply an ACE wrap or wear compression (TED) stockings to help to reduce swelling.  Reduce your risk of vascular disease  Stop smoking. If you would like help call [facility] at 1-800-QUIT-NOW (1-800-784-8669) or [facility] at 336-586-4000.  Manage your cholesterol Maintain a desired weight Control your diabetes weight Control your diabetes Keep your blood pressure down  If you have any questions, please call the office at [phone]  

## 2017-11-02 NOTE — Op Note (Signed)
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 Randie Heinz, MD  Assistant: Doreatha Massed, PA Procedure Performed: 1.  Harvest right greater saphenous vein 2.  Right common femoral endarterectomy 3.  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  He is indicated for bypass having undergone angiogram the day prior demonstrated occlusion of his SFA.  Findings: Common femoral artery was heavily calcified.  We are able to perform endarterectomy establish strong inflow and backbleeding from the profunda femoris artery.  There is an occluded stent in the SFA at the takeoff.  Distally the popliteal artery was soft was free of disease.  Saphenous vein measured greater than 3-1/2 mm throughout its course.  At completion there was strong signal at the anterior tibial at the ankle that diminished with compression of the vein graft.   Procedure:  The patient was identified in the holding area and taken to the operating room where he was placed supine on the operating table and general endotracheal anesthesia was induced.  He was sterilely prepped and draped in the entire right lower extremity given antibiotics and a timeout was called.  We began with ultrasound to evaluate the saphenous vein there were multiple branches but it did look adequate throughout its course in the thigh.  A transverse incision was made overlying the common femoral artery we dissected down onto this and was noted to be heavily calcified.  We dissected up onto the inguinal ligament onto the external iliac artery placed a vessel loop around this.  Subsequently placed  Vesseloops around both the superficial femoral and profunda femoris arteries.  I then extended my transverse incision overlying the saphenous vein.  I dissected this out divided branches between clips and ties.  2 separate incisions were made in the thigh to dissected vein and again branches between clips and ties.  Below the knee identified the vein with ultrasound then made a longitudinal incision from a below-knee popliteal exposure.  I extended this further inferiorly dissected out the vein and ultimately doubly clipped it and divided it.  We are then able to pass it through all of our incisions up to the groin.  In the groin we clamped with a side-biting clamp divided it was passed off.  The saphenofemoral junction was oversewn with 5-0 Prolene suture in a mattress fashion.  At this time the below-knee popliteal artery was exposed in the usual fashion by opening our incision deeper with cautery.  Placed a vessel loop around the medial popliteal vein and retracted it medially.  Vesseloops were placed around the popliteal artery.  I then passed the tunneler between that incision in the groin incision and the patient was given 10,000 units of heparin.  At this time I clamped my outflow profunda femoris artery followed by my external iliac artery.  I open the artery longitudinally and performed endarterectomy.  Proximally there was an large calcified plaque and I was able to pull and develop strong inflow.  Our vein was prepared by repairing small branches.  The vein was then spatulated and sewn end-to-side to our endarterectomy site with 5-0 Prolene suture.  Upon completion we flushed through the vein graft.  I then used a  valvulotome to lyse the valves until I had very strong outflow.  The distal aspect of the vein was then doubly clipped.  There were a few areas to repair with 6-0 Prolene on our vein.  He was then marked for orientation and tunneled to the below-knee popliteal incision.  Patient was given  additional heparin at this time given that it had been 45 minutes.  I then clamped the popliteal artery distally and proximally and opened it longitudinally.  I did check for good backbleeding and it did have that.  I then straighten the leg and trimmed the vein to size.  It was spatulated and sewn end-to-side to the popliteal artery.  Prior to completing anastomosis I allowed backbleeding and antegrade bleeding through the vein graft.  Flushed this with heparinized saline.  Upon completion there was strong pulsation in the vein graft distally multiphasic signal in the popliteal artery.  There is also a strong signal of the anterior tibial artery was nearly nonexistent with compression of the vein graft.  Patient at this time was given 50 mg of protamine which he tolerated well.  All incisions were irrigated closed in layers with Vicryl Monocryl.  Dermabond was placed to level the skin.  He was allowed away from anesthesia having tolerated procedure without any complication.  All counts were correct at completion.  Next  EBL 200 cc.   Lorrin Bodner C. Randie Heinzain, MD [facility] [place] Office: [phone] Pager: [phone]

## 2017-11-02 NOTE — Anesthesia Preprocedure Evaluation (Addendum)
Anesthesia Evaluation  Patient identified by MRN, date of birth, ID band Patient awake    Reviewed: Allergy & Precautions, NPO status , Patient's Chart, lab work & pertinent test results, reviewed documented beta blocker date and time   History of Anesthesia Complications Negative for: history of anesthetic complications  Airway Mallampati: II  TM Distance: >3 FB Neck ROM: Full    Dental  (+) Poor Dentition, Missing, Loose, Dental Advisory Given   Pulmonary Current Smoker,    Pulmonary exam normal        Cardiovascular hypertension, + CAD, + Past MI and + Cardiac Stents  Normal cardiovascular exam  Study Conclusions  - Left ventricle: The cavity size was normal. Systolic function was   normal. The estimated ejection fraction was in the range of 50%   to 55%. Features are consistent with a pseudonormal left   ventricular filling pattern, with concomitant abnormal relaxation   and increased filling pressure (grade 2 diastolic dysfunction).   Doppler parameters are consistent with indeterminate ventricular   filling pressure. Mild focal basal septal hypertrophy. - Regional wall motion abnormality: Hypokinesis of the mid inferior   and basal-mid inferolateral myocardium.   Neuro/Psych Anxiety CVA    GI/Hepatic negative GI ROS, Neg liver ROS,   Endo/Other  negative endocrine ROS  Renal/GU negative Renal ROS     Musculoskeletal   Abdominal   Peds  Hematology   Anesthesia Other Findings   Reproductive/Obstetrics                           Anesthesia Physical Anesthesia Plan  ASA: III  Anesthesia Plan: General   Post-op Pain Management:    Induction: Intravenous  PONV Risk Score and Plan: 2 and Ondansetron and Dexamethasone  Airway Management Planned: Oral ETT  Additional Equipment:   Intra-op Plan:   Post-operative Plan: Extubation in OR  Informed Consent: I have reviewed the  patients History and Physical, chart, labs and discussed the procedure including the risks, benefits and alternatives for the proposed anesthesia with the patient or authorized representative who has indicated his/her understanding and acceptance.   Dental advisory given  Plan Discussed with: CRNA and Anesthesiologist  Anesthesia Plan Comments:        Anesthesia Quick Evaluation

## 2017-11-02 NOTE — Progress Notes (Signed)
Report given to Zahari Xiang bostic rn as caregiver 

## 2017-11-02 NOTE — Anesthesia Postprocedure Evaluation (Signed)
Anesthesia Post Note  Patient: [name]  Procedure(s) Performed: BYPASS GRAFT RIGHT FEMORAL TO BELOW KNEE POPLITEAL ARTERY USING RIGHT GREAT SAPHENOUS VEIN  (Right ) VEIN HARVEST RIGHT GREAT SAPHENOUS VEIN (Right ) ENDARTERECTOMY RIGHT FEMORAL ARTERY (Right )     Patient location during evaluation: PACU Anesthesia Type: General Level of consciousness: sedated Pain management: pain level controlled Vital Signs Assessment: post-procedure vital signs reviewed and stable Respiratory status: spontaneous breathing and respiratory function stable Cardiovascular status: stable Postop Assessment: no apparent nausea or vomiting Anesthetic complications: no    Last Vitals:  Vitals:   11/02/17 1412 11/02/17 1421  BP: (!) 155/75 (!) 157/81  Pulse: 71 72  Resp: 14 12  Temp:  36.8 C  SpO2: 97% 100%    Last Pain:  Vitals:   11/02/17 1421  TempSrc:   PainSc: 6                  Christana Angelica,Dier DANIEL

## 2017-11-02 NOTE — Anesthesia Procedure Notes (Signed)
Procedure Name: Intubation Date/Time: 11/02/2017 10:12 AM Performed by: Marena ChancyBeckner, Carnita Golob S, CRNA Pre-anesthesia Checklist: Patient identified, Emergency Drugs available, Suction available, Patient being monitored and Timeout performed Patient Re-evaluated:Patient Re-evaluated prior to induction Oxygen Delivery Method: Circle system utilized Preoxygenation: Pre-oxygenation with 100% oxygen Induction Type: IV induction Ventilation: Mask ventilation without difficulty and Oral airway inserted - appropriate to patient size Laryngoscope Size: Miller and 3 Grade View: Grade I Tube size: 7.5 mm Number of attempts: 1 Airway Equipment and Method: Stylet Placement Confirmation: ETT inserted through vocal cords under direct vision,  positive ETCO2 and breath sounds checked- equal and bilateral Secured at: 24 cm Tube secured with: Tape Dental Injury: Teeth and Oropharynx as per pre-operative assessment  Comments: Intubated by Huel CoteMarianna Furr, SRNA

## 2017-11-03 ENCOUNTER — Encounter (HOSPITAL_COMMUNITY): Payer: Self-pay | Admitting: Vascular Surgery

## 2017-11-03 ENCOUNTER — Telehealth: Payer: Self-pay | Admitting: Vascular Surgery

## 2017-11-03 ENCOUNTER — Inpatient Hospital Stay (HOSPITAL_COMMUNITY): Payer: Medicare Other

## 2017-11-03 DIAGNOSIS — I739 Peripheral vascular disease, unspecified: Secondary | ICD-10-CM

## 2017-11-03 LAB — BASIC METABOLIC PANEL
ANION GAP: 9 (ref 5–15)
BUN: 11 mg/dL (ref 6–20)
CALCIUM: 8.2 mg/dL — AB (ref 8.9–10.3)
CHLORIDE: 106 mmol/L (ref 98–111)
CO2: 21 mmol/L — ABNORMAL LOW (ref 22–32)
CREATININE: 0.96 mg/dL (ref 0.61–1.24)
GFR calc non Af Amer: >60 mL/min (ref >60)
Glucose, Bld: 121 mg/dL — ABNORMAL HIGH (ref 70–99)
Potassium: 4.5 mmol/L (ref 3.5–5.1)
SODIUM: 136 mmol/L (ref 135–145)

## 2017-11-03 LAB — GLUCOSE, CAPILLARY
GLUCOSE-CAPILLARY: 144 mg/dL — AB (ref 70–99)
GLUCOSE-CAPILLARY: 124 mg/dL — AB (ref 70–99)
GLUCOSE-CAPILLARY: 132 mg/dL — AB (ref 70–99)

## 2017-11-03 LAB — CBC
HCT: 37.5 % — ABNORMAL LOW (ref 39.0–52.0)
Hemoglobin: 12.3 g/dL — ABNORMAL LOW (ref 13.0–17.0)
MCH: 30.6 pg (ref 26.0–34.0)
MCHC: 32.8 g/dL (ref 30.0–36.0)
MCV: 93.3 fL (ref 78.0–100.0)
Platelets: 140 10*3/uL — ABNORMAL LOW (ref 150–400)
RBC: 4.02 MIL/uL — AB (ref 4.22–5.81)
RDW: 13.8 % (ref 11.5–15.5)
WBC: 14.7 10*3/uL — AB (ref 4.0–10.5)

## 2017-11-03 NOTE — Plan of Care (Signed)
  Problem: Health Behavior/Discharge Planning: Goal: Ability to manage health-related needs will improve Outcome: Progressing   Problem: Clinical Measurements: Goal: Ability to maintain clinical measurements within normal limits will improve Outcome: Progressing Goal: Will remain free from infection Outcome: Progressing   

## 2017-11-03 NOTE — Evaluation (Signed)
Occupational Therapy Evaluation Patient Details Name: Eugene Parker MRN: [id number] DOB: March 03, 1961 Today's Date: 11/03/2017    History of Present Illness 57 y.o. male is s/p right femoral to below-knee popliteal artery bypass with vein for wound on his right foot.  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  He is indicated for bypass having undergone angiogram the day prior demonstrated occlusion of his SFA.   Clinical Impression   Pt with decline in function and safety with ADLs and ADL mobility with decreased balance and endurance. Pt reports 8/10 pain in R LE/foot, however was agreeable to activity EOB and OOB. Pt required sup to sit EOB and min guard A for sit - stand/transfers. Pt mod A with LB ADLs. Pt would benefit from acute OT services to address impairments to maximize level of function and safety    Follow Up Recommendations  Home health OT;Supervision - Intermittent    Equipment Recommendations  3 in 1 bedside commode;Tub/shower bench;Other (comment)(reacher, LH bath sponge)    Recommendations for Other Services       Precautions / Restrictions Precautions Precautions: Fall Precaution Comments: R LE incison, R foot edema with open would on dorsum      Mobility Bed Mobility Overal bed mobility: Needs Assistance Bed Mobility: Supine to Sit;Sit to Supine     Supine to sit: Supervision Sit to supine: Supervision   General bed mobility comments: slow, guarded pace of movement  Transfers Overall transfer level: Needs assistance Equipment used: Rolling walker (2 wheeled) Transfers: Sit to/from [employer] Sit to Stand: Min guard Stand pivot transfers: Min guard       General transfer comment: slow, guarded pace of movement, cues for correct hand placement    Balance Overall balance assessment: Needs assistance Sitting-balance support: No upper extremity supported;Feet supported Sitting  balance-Leahy Scale: Good     Standing balance support: Single extremity supported;Bilateral upper extremity supported;During functional activity Standing balance-Leahy Scale: Poor                             ADL either performed or assessed with clinical judgement   ADL Overall ADL's : Needs assistance/impaired Eating/Feeding: Independent;Sitting   Grooming: Wash/dry hands;Wash/dry face;Minimal assistance;Standing   Upper Body Bathing: Set up;Supervision/ safety;Sitting   Lower Body Bathing: Moderate assistance   Upper Body Dressing : Set up;Supervision/safety;Sitting   Lower Body Dressing: Moderate assistance   Toilet Transfer: Min guard;RW;Ambulation;Cueing for Chief of Staff Details (indicate cue type and reason): siumlated from bed to recliner Toileting- Clothing Manipulation and Hygiene: Moderate assistance;Sitting/lateral lean;Sit to/from stand       Functional mobility during ADLs: Min guard;Rolling walker;Cueing for safety General ADL Comments: pt and father educated on ADL A/E and tub bench for home use with handout provided     Vision Baseline Vision/History: Wears glasses Wears Glasses: Reading only Patient Visual Report: No change from baseline       Perception     Praxis      Pertinent Vitals/Pain Pain Assessment: 0-10 Pain Score: 8  Pain Descriptors / Indicators: Sore;Grimacing;Guarding;Discomfort Pain Intervention(s): Limited activity within patient's tolerance;Monitored during session;Repositioned     Hand Dominance Right   Extremity/Trunk Assessment Upper Extremity Assessment Upper Extremity Assessment: Overall WFL for tasks assessed   Lower Extremity Assessment Lower Extremity Assessment: Defer to PT evaluation   Cervical / Trunk Assessment Cervical / Trunk Assessment: Normal   Communication Communication  Communication: No difficulties   Cognition Arousal/Alertness: Awake/alert Behavior During Therapy: WFL for  tasks assessed/performed Overall Cognitive Status: Within Functional Limits for tasks assessed                                     General Comments       Exercises     Shoulder Instructions      Home Living Family/patient expects to be discharged to:: Private residence Living Arrangements: Alone Available Help at Discharge: Family;Available PRN/intermittently Type of Home: Mobile home Home Access: Stairs to enter;Ramped entrance Entrance Stairs-Number of Steps: 2         Bathroom Shower/Tub: Chief Strategy OfficerTub/shower unit   Bathroom Toilet: Standard     Home Equipment: Cane - single point          Prior Functioning/Environment Level of Independence: Independent with assistive device(s)  Gait / Transfers Assistance Needed: pt reports that he uses a cane ADL's / Homemaking Assistance Needed: pt reports that he was independent with ADLs/selfcare, home mgt and was driving            OT Problem List: Decreased activity tolerance;Decreased knowledge of use of DME or AE;Impaired balance (sitting and/or standing);Decreased coordination;Pain      OT Treatment/Interventions: Self-care/ADL training;Therapeutic exercise;DME and/or AE instruction;Therapeutic activities;Patient/family education    OT Goals(Current goals can be found in the care plan section) Acute Rehab OT Goals Patient Stated Goal: get better and go home OT Goal Formulation: With patient/family Time For Goal Achievement: 11/17/17 Potential to Achieve Goals: Good ADL Goals Pt Will Perform Grooming: with min guard assist;with supervision;with set-up;standing Pt Will Perform Lower Body Bathing: with min assist;sitting/lateral leans;sit to/from stand;with min guard assist Pt Will Perform Lower Body Dressing: with min assist;with min guard assist;sitting/lateral leans;sit to/from stand Pt Will Transfer to Toilet: with supervision;ambulating;regular height toilet;grab bars Pt Will Perform Toileting - Clothing  Manipulation and hygiene: with min assist;sitting/lateral leans;sit to/from stand  OT Frequency: Min 2X/week   Barriers to D/C:            Co-evaluation              AM-PAC PT "6 Clicks" Daily Activity     Outcome Measure Help from another person eating meals?: None Help from another person taking care of personal grooming?: A Little Help from another person toileting, which includes using toliet, bedpan, or urinal?: A Little Help from another person bathing (including washing, rinsing, drying)?: A Lot Help from another person to put on and taking off regular upper body clothing?: None Help from another person to put on and taking off regular lower body clothing?: A Lot 6 Click Score: 18   End of Session Equipment Utilized During Treatment: Gait belt;Rolling walker  Activity Tolerance: Patient tolerated treatment well Patient left: in chair;with call bell/phone within reach;with family/visitor present  OT Visit Diagnosis: Unsteadiness on feet (R26.81);Other abnormalities of gait and mobility (R26.89);Pain Pain - Right/Left: Right Pain - part of body: Leg;Ankle and joints of foot                Time: [id number] OT Time Calculation (min): 25 min Charges:  OT General Charges $OT Visit: 1 Visit OT Evaluation $OT Eval Moderate Complexity: 1 Mod G-Codes: OT G-codes **NOT FOR INPATIENT CLASS** Functional Assessment Tool Used: AM-PAC 6 Clicks Daily Activity     Galen ManilaSpencer, Hillery Bhalla Jeanette 11/03/2017, 1:41 PM

## 2017-11-03 NOTE — Telephone Encounter (Signed)
sch appt spk to pt 11/17/17 345pm p/o MD

## 2017-11-03 NOTE — Progress Notes (Signed)
  Progress Note    11/03/2017 8:13 AM 1 Day Post-Op  Subjective: Feeling good this morning  Vitals:   11/02/17 2005 11/03/17 0435  BP: 113/62 (!) 100/58  Pulse: 65 70  Resp: 17 16  Temp: 97.9 F (36.6 C) 97.6 F (36.4 C)  SpO2: 100% 97%    Physical Exam: Awake alert oriented Nonlabored respirations Incision right lower extremity clean dry intact Palpable anterior tibial pulse of the ankle Strong signal on the foot Dressing on foot clean dry intact  CBC    Component Value Date/Time   WBC 14.7 (H) 11/03/2017 0408   RBC 4.02 (L) 11/03/2017 0408   HGB 12.3 (L) 11/03/2017 0408   HCT 37.5 (L) 11/03/2017 0408   PLT 140 (L) 11/03/2017 0408   MCV 93.3 11/03/2017 0408   MCH 30.6 11/03/2017 0408   MCHC 32.8 11/03/2017 0408   RDW 13.8 11/03/2017 0408   LYMPHSABS 3.0 03/22/2016 0325   MONOABS 1.1 (H) 03/22/2016 0325   EOSABS 0.6 03/22/2016 0325   BASOSABS 0.1 03/22/2016 0325    BMET    Component Value Date/Time   NA 136 11/03/2017 0408   K 4.5 11/03/2017 0408   CL 106 11/03/2017 0408   CO2 21 (L) 11/03/2017 0408   GLUCOSE 121 (H) 11/03/2017 0408   BUN 11 11/03/2017 0408   CREATININE 0.96 11/03/2017 0408   CALCIUM 8.2 (L) 11/03/2017 0408   GFRNONAA >60 11/03/2017 0408   GFRAA >60 11/03/2017 0408    INR    Component Value Date/Time   INR 1.01 11/01/2017 1208     Intake/Output Summary (Last 24 hours) at 11/03/2017 0813 Last data filed at 11/03/2017 0538 Gross per 24 hour  Intake 3196.52 ml  Output 1730 ml  Net 1466.52 ml     Assessment:  57 y.o. male is s/p right femoral to below-knee popliteal artery bypass with vein for wound on his right foot   Plan: Out of bed as tolerated Continue aspirin We will hold home dose anticoagulation until ready for discharge continue subcu heparin Wet-to-dry dressings on right foot   Ghali Morissette C. Randie Heinzain, MD [facility] [place] Office: [phone] Pager: [phone]  11/03/2017 8:13 AM

## 2017-11-03 NOTE — Progress Notes (Signed)
VASCULAR LAB PRELIMINARY  ARTERIAL  ABI completed:    RIGHT    LEFT    PRESSURE WAVEFORM  PRESSURE WAVEFORM  BRACHIAL 135 Triphasic BRACHIAL 144 Triphasic  DP 125 Biphasic DP 88 Biphasic  AT   AT    PT 119 Biphasic PT 102 Biphasic  PER   PER    GREAT TOE 61 NA GREAT TOE 71 NA    RIGHT LEFT  ABI 0.87 0.71   Right ABI is Mild for lower extremity arterial disease at rest Left ABI is Moderate for lower extremity arterial disease at rest.   Eugene Parker 11/03/2017, 11:26 AM

## 2017-11-03 NOTE — Progress Notes (Signed)
PT Cancellation Note  Patient Details Name: Eugene Parker MRN: [id number] DOB: 09/20/1960   Cancelled Treatment:    Reason Eval/Treat Not Completed: Patient at procedure or test/unavailable   [name] 11/03/2017, 10:50 AM Skip Mayerary Marketia Stallsmith PT [phone]

## 2017-11-03 NOTE — Progress Notes (Signed)
  Progress Note    11/03/2017 8:06 AM 1 Day Post-Op  Subjective:  No rest pain R foot; patient sitting up in chair on exam   Vitals:   11/02/17 2005 11/03/17 0435  BP: 113/62 (!) 100/58  Pulse: 65 70  Resp: 17 16  Temp: 97.9 F (36.6 C) 97.6 F (36.4 C)  SpO2: 100% 97%   Physical Exam: Cardiac:  RRR Lungs:  Non labored Incisions:  R groin incision with some moisture, vein harvest incisions c/d/i/; popliteal incision without palpable hematoma or fluctuance, no drainage Extremities:  R DP by doppler; R foot wound with healthy wound bed, no purulence Abdomen:  Soft Neurologic: A&O  CBC    Component Value Date/Time   WBC 14.7 (H) 11/03/2017 0408   RBC 4.02 (L) 11/03/2017 0408   HGB 12.3 (L) 11/03/2017 0408   HCT 37.5 (L) 11/03/2017 0408   PLT 140 (L) 11/03/2017 0408   MCV 93.3 11/03/2017 0408   MCH 30.6 11/03/2017 0408   MCHC 32.8 11/03/2017 0408   RDW 13.8 11/03/2017 0408   LYMPHSABS 3.0 03/22/2016 0325   MONOABS 1.1 (H) 03/22/2016 0325   EOSABS 0.6 03/22/2016 0325   BASOSABS 0.1 03/22/2016 0325    BMET    Component Value Date/Time   NA 136 11/03/2017 0408   K 4.5 11/03/2017 0408   CL 106 11/03/2017 0408   CO2 21 (L) 11/03/2017 0408   GLUCOSE 121 (H) 11/03/2017 0408   BUN 11 11/03/2017 0408   CREATININE 0.96 11/03/2017 0408   CALCIUM 8.2 (L) 11/03/2017 0408   GFRNONAA >60 11/03/2017 0408   GFRAA >60 11/03/2017 0408    INR    Component Value Date/Time   INR 1.01 11/01/2017 1208     Intake/Output Summary (Last 24 hours) at 11/03/2017 0806 Last data filed at 11/03/2017 0538 Gross per 24 hour  Intake 3196.52 ml  Output 1730 ml  Net 1466.52 ml     Assessment/Plan:  57 y.o. male is s/p R CFA endart with fem-BK pop bypass with vein 1 Day Post-Op   Patent bypass with R DP by doppler R foot BID wet to dry dressing changes Change R groin gauze every shift Encouraged OOB today Will restart Eliquis tomorrow if no bleeding with ambulating today D/c  when pain controlled and mobility increased   Emilie RutterMatthew Vaniya Augspurger, PA-C [facility] [phone] 11/03/2017 8:06 AM

## 2017-11-03 NOTE — Evaluation (Signed)
Physical Therapy Evaluation Patient Details Name: Eugene Parker Eugene Parker MRN: [id number] DOB: 12/12/1960 Today's Date: 11/03/2017   History of Present Illness  57 y.o. male is s/p right femoral to below-knee popliteal artery bypass with vein for wound on his right foot.  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  He is indicated for bypass having undergone angiogram the day prior demonstrated occlusion of his SFA.  Clinical Impression  Pt admitted with above diagnosis and presents to PT with functional limitations due to deficits listed below (See PT problem list). Pt needs skilled PT to maximize independence and safety to allow discharge to home. Expect pt to make good progress with all mobility.      Follow Up Recommendations No PT follow up    Equipment Recommendations  None recommended by PT    Recommendations for Other Services       Precautions / Restrictions Precautions Precautions: Fall Precaution Comments: R LE incison, R foot edema with open would on dorsum      Mobility  Bed Mobility Overal bed mobility: Modified Independent Bed Mobility: Supine to Sit;Sit to Supine     Supine to sit: Modified independent (Device/Increase time);HOB elevated Sit to supine: Modified independent (Device/Increase time)   General bed mobility comments: Incr time  Transfers Overall transfer level: Needs assistance Equipment used: Rolling walker (2 wheeled) Transfers: Sit to/from [employer] CorporationStand;Stand Pivot Transfers Sit to Stand: Supervision Stand pivot transfers: Min guard       General transfer comment: supervision for safety  Ambulation/Gait Ambulation/Gait assistance: Supervision Gait Distance (Feet): 60 Feet Assistive device: Rolling walker (2 wheeled) Gait Pattern/deviations: Step-through pattern;Decreased step length - left;Decreased stance time - right;Decreased weight shift to right Gait velocity: decr Gait velocity interpretation:  <1.31 ft/sec, indicative of household ambulator General Gait Details: Supervision for safety. Pt with deliberate gait and with decr knee flex/extension due to soreness. Pt able to amb with rt foot flat on floor  Stairs            Wheelchair Mobility    Modified Rankin (Stroke Patients Only)       Balance Overall balance assessment: Needs assistance Sitting-balance support: No upper extremity supported;Feet supported Sitting balance-Leahy Scale: Good     Standing balance support: Single extremity supported;Bilateral upper extremity supported;During functional activity Standing balance-Leahy Scale: Poor Standing balance comment: UE support                             Pertinent Vitals/Pain Pain Assessment: Faces Pain Score: 8  Faces Pain Scale: Hurts even more Pain Location: RLE Pain Descriptors / Indicators: Sore;Grimacing;Guarding;Discomfort Pain Intervention(s): Limited activity within patient's tolerance;Monitored during session;Repositioned    Home Living Family/patient expects to be discharged to:: Private residence Living Arrangements: Alone Available Help at Discharge: Family;Available PRN/intermittently Type of Home: Mobile home Home Access: Ramped entrance   Entrance Stairs-Number of Steps: 2   Home Equipment: Cane - single point;Walker - 2 wheels;Walker - 4 wheels      Prior Function Level of Independence: Independent with assistive device(s)   Gait / Transfers Assistance Needed: pt reports that he uses a cane  ADL's / Homemaking Assistance Needed: pt reports that he was independent with ADLs/selfcare, home mgt and was driving        Hand Dominance   Dominant Hand: Right    Extremity/Trunk Assessment   Upper Extremity Assessment Upper Extremity Assessment: Overall WFL for tasks  assessed    Lower Extremity Assessment Lower Extremity Assessment: RLE deficits/detail RLE Deficits / Details: Limited by expected post op pain and  soreness    Cervical / Trunk Assessment Cervical / Trunk Assessment: Normal  Communication   Communication: No difficulties  Cognition Arousal/Alertness: Awake/alert Behavior During Therapy: WFL for tasks assessed/performed Overall Cognitive Status: Within Functional Limits for tasks assessed                                        General Comments      Exercises     Assessment/Plan    PT Assessment Patient needs continued PT services  PT Problem List Decreased strength;Decreased range of motion;Decreased balance;Decreased mobility;Pain       PT Treatment Interventions DME instruction;Gait training;Functional mobility training;Therapeutic activities;Therapeutic exercise;Balance training;Patient/family education    PT Goals (Current goals can be found in the Care Plan section)  Acute Rehab PT Goals Patient Stated Goal: get better and go home PT Goal Formulation: With patient Time For Goal Achievement: 11/10/17 Potential to Achieve Goals: Good    Frequency Min 3X/week   Barriers to discharge Decreased caregiver support Lives alone    Co-evaluation               AM-PAC PT "6 Clicks" Daily Activity  Outcome Measure Difficulty turning over in bed (including adjusting bedclothes, sheets and blankets)?: A Little Difficulty moving from lying on back to sitting on the side of the bed? : A Little Difficulty sitting down on and standing up from a chair with arms (e.g., wheelchair, bedside commode, etc,.)?: A Little Help needed moving to and from a bed to chair (including a wheelchair)?: A Little Help needed walking in hospital room?: A Little Help needed climbing 3-5 steps with a railing? : A Little 6 Click Score: 18    End of Session   Activity Tolerance: Patient tolerated treatment well Patient left: in bed;with call bell/phone within reach;with family/visitor present Nurse Communication: Mobility status PT Visit Diagnosis: Other abnormalities of  gait and mobility (R26.89);Pain Pain - Right/Left: Right Pain - part of body: Leg    Time: 1317-1330 PT Time Calculation (min) (ACUTE ONLY): 13 min   Charges:   PT Evaluation $PT Eval Low Complexity: 1 Low     PT G CodesMarland Kitchen        [facility] PT [phone]   Angelina Ok [facility] 11/03/2017, 3:46 PM

## 2017-11-04 LAB — GLUCOSE, CAPILLARY: GLUCOSE-CAPILLARY: 120 mg/dL — AB (ref 70–99)

## 2017-11-04 MED ORDER — APIXABAN 5 MG PO TABS
5.0000 mg | ORAL_TABLET | Freq: Two times a day (BID) | ORAL | Status: DC
  Administered 2017-11-04: 5 mg via ORAL
  Filled 2017-11-04: qty 1

## 2017-11-04 MED ORDER — OXYCODONE-ACETAMINOPHEN 5-325 MG PO TABS: 1.0000 | ORAL_TABLET | ORAL | 0 refills | Status: AC | PRN

## 2017-11-04 MED ORDER — ASPIRIN 81 MG PO TBEC: 81.0000 mg | DELAYED_RELEASE_TABLET | Freq: Every day | ORAL | Status: DC
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
## 2017-11-04 NOTE — Care Management Note (Signed)
Case Management Note  Patient Details  Name: Eugene Parker MRN: [id number] Date of Birth: 03/05/1961  Subjective/Objective: Pt presented for right femoral-popliteal artery bypass- Right foot wound. PTA from home alone and has support of Father. NO PT follow up and pt declines HH OT. Per pt [facility]. [facility].                 Action/Plan: [facility]- No orders present. Spoke with MD Leonides SakeBrian Chen over the phone and he stated for CM to place orders verbal order. CM did make him aware that pt will need a F2F signed and CM will not be able to place this. No PA available to place [facility]/ [facility].  Staff RN aware that DME will be delivered to room prior to transition home. [facility]. Patient aware to go to the gift shop on first floor for the reacher and bath sponge with additional supplies- $30.00 for total package and just the reacher will be $18.14 cash or credit card only for this. Pt may need to go to another medical supply store to be cheaper. Pt has Medicare/ Medicaid- for medication assistance. No further needs from CM at this time.   Expected Discharge Date:  11/04/17               Expected Discharge Plan:  Home w Home Health Services  In-House Referral:  NA  Discharge planning Services  CM Consult  Post Acute Care Choice:  Durable Medical Equipment, Home Health Choice offered to:  Patient  DME Arranged:  Walker rolling, [employer] DME Agency:  [facility]  HH Arranged:  RN(Wound Care) HH Agency:  [facility]  Status of Service:  Completed, signed off  If discussed at Long Length of Stay Meetings, dates discussed:    Additional Comments:  Gala LewandowskyGraves-Bigelow, Drury Ardizzone Kaye, RN 11/04/2017, 10:49 AM

## 2017-11-06 ENCOUNTER — Other Ambulatory Visit: Payer: Self-pay

## 2017-11-06 ENCOUNTER — Encounter (HOSPITAL_COMMUNITY): Payer: Self-pay | Admitting: Emergency Medicine

## 2017-11-06 ENCOUNTER — Emergency Department (HOSPITAL_COMMUNITY)
Admission: EM | Admit: 2017-11-06 | Discharge: 2017-11-06 | Disposition: A | Discharge status: Home / Self Care | Payer: Medicare Other | Attending: Emergency Medicine | Admitting: Emergency Medicine

## 2017-11-06 DIAGNOSIS — I2510 Atherosclerotic heart disease of native coronary artery without angina pectoris: Secondary | ICD-10-CM | POA: Insufficient documentation

## 2017-11-06 DIAGNOSIS — F17210 Nicotine dependence, cigarettes, uncomplicated: Secondary | ICD-10-CM | POA: Diagnosis not present

## 2017-11-06 DIAGNOSIS — Z7982 Long term (current) use of aspirin: Secondary | ICD-10-CM | POA: Insufficient documentation

## 2017-11-06 DIAGNOSIS — Z79899 Other long term (current) drug therapy: Secondary | ICD-10-CM | POA: Insufficient documentation

## 2017-11-06 DIAGNOSIS — I10 Essential (primary) hypertension: Secondary | ICD-10-CM | POA: Insufficient documentation

## 2017-11-06 DIAGNOSIS — Z7901 Long term (current) use of anticoagulants: Secondary | ICD-10-CM | POA: Diagnosis not present

## 2017-11-06 DIAGNOSIS — T819XXA Unspecified complication of procedure, initial encounter: Secondary | ICD-10-CM | POA: Diagnosis present

## 2017-11-06 DIAGNOSIS — Y69 Unspecified misadventure during surgical and medical care: Secondary | ICD-10-CM | POA: Insufficient documentation

## 2017-11-06 DIAGNOSIS — Z951 Presence of aortocoronary bypass graft: Secondary | ICD-10-CM | POA: Insufficient documentation

## 2017-11-06 DIAGNOSIS — Z5189 Encounter for other specified aftercare: Secondary | ICD-10-CM

## 2017-11-06 MED ORDER — CEPHALEXIN 500 MG PO CAPS: 500.0000 mg | ORAL_CAPSULE | Freq: Three times a day (TID) | ORAL | 0 refills | Status: DC

## 2017-11-06 MED ORDER — CEPHALEXIN 500 MG PO CAPS
500.0000 mg | ORAL_CAPSULE | Freq: Once | ORAL | Status: AC
  Administered 2017-11-06: 500 mg via ORAL
  Filled 2017-11-06: qty 1

## 2017-11-06 NOTE — Discharge Summary (Signed)
Physician Discharge Summary   Patient ID: Eugene Parker [id number] 57 y.o. 01-01-1961  Admit date: 11/01/2017  Discharge date and time: 11/04/2017 12:15 PM   Admitting Physician: Eugene Harman, MD   Discharge Physician: Dr. Imogene Parker  Admission Diagnoses: PAD (peripheral artery disease) [facility]) [I73.9]  Discharge Diagnoses: same R foot ulceration  Admission Condition: fair  Discharged Condition: fair  Indication for Admission: Critical RLE ischemia with wound  Hospital Course: [name] is a 57y.o. Male who came in as an outpatient for RLE angiogram due to CLI with R foot ulceration on 11/01/17 by Dr. Randie Parker.  He tolerated this procedure well and he was admitted to the hospital.  Arteriogram demonstrated an occluded R SFA stent as well as occluded native SFA.  He was brought to the OR by Dr. Randie Parker forR femoral to BK popliteal artery bypass with vein on 11/02/17.  POD #1 patient had R DP by doppler.  POD#2 patient had palpable R ATA and was feeling fit for discharge home.  Case management arranged DME as well as home health RN for continued wound care of R dorsal foot ulceration.  He will restart home dose of Eliquis.  He will follow up in 2 weeks to see Dr. Randie Parker.  Discharge instructions were reviewed with the patient and he voices his understanding.  He was discharged to [facility] with [facility] in stable condition.  Consults: None  Treatments: surgery by Dr. Randie Parker 1.  RLE arteriogram with CO2 on 11/01/17 2.  R fem-BK pop with nonreversed ipsilateral GSV on 11/02/17  Discharge Exam: see progress note 11/04/17 Vitals:   11/04/17 0400 11/04/17 1138  BP:  110/62  Pulse:    Resp:    Temp: 97.6 F (36.4 C) 98.2 F (36.8 C)  SpO2:       Disposition: home with HH  - For VQI Registry use ---  Post-op:  Wound infection: No  Graft infection: No  Transfusion: No  If yes,  units given New Arrhythmia: No Ipsilateral amputation: [x ] no, [ ]  Minor, [ ]  BKA, [ ]  AKA Patency judged by: [  ] Dopper only, [ ]  Palpable graft pulse, [x ] Palpable distal pulse, [ ]  ABI inc. > 0.15, [ ]  Duplex Discharge ABI: R 0.87, L 0.71 D/C Ambulatory Status: Ambulatory  Complications: MI: [x ] No, [ ]  Troponin only, [ ]  EKG or Clinical CHF: No Resp failure: [x ] none, [ ]  Pneumonia, [ ]  Ventilator Chg in renal function: [ x] none, [ ]  Inc. Cr > 0.5, [ ]  Temp. Dialysis, [ ]  Permanent dialysis Stroke: [ x] None, [ ]  Minor, [ ]  Major Return to OR: No  Reason for return to OR: [ ]  Bleeding, [ ]  Infection, [ ]  Thrombosis, [ ]  Revision  Discharge medications: Statin use:  Yes ASA use:  Yes Plavix use:  No  for medical reason no indicated Beta blocker use: Yes Coumadin use: Yes Eliquis    Patient Instructions:  Allergies as of 11/04/2017      Reactions   Codeine Nausea Only      Medication List    STOP taking these medications   Oxycodone HCl 20 MG Tabs     TAKE these medications   ALPRAZolam 0.5 MG tablet Commonly known as:  XANAX Take 0.5 mg by mouth 3 (three) times daily as needed for anxiety.   apixaban 5 MG Tabs tablet Commonly known as:  ELIQUIS Take 1 tablet (5 mg total) by mouth 2 (two)  times daily.   aspirin 81 MG EC tablet Take 1 tablet (81 mg total) by mouth daily.   atorvastatin 80 MG tablet Commonly known as:  LIPITOR Take 1 tablet (80 mg total) by mouth daily. What changed:  when to take this   gabapentin 600 MG tablet Commonly known as:  NEURONTIN Take 300 mg by mouth 3 (three) times daily.   lisinopril 10 MG tablet Commonly known as:  PRINIVIL,ZESTRIL Take 10 mg by mouth daily.   metoprolol tartrate 25 MG tablet Commonly known as:  LOPRESSOR Take 25 mg ( 1 Tablet)  in the AM and Take 37.5 mg ( 1 1/2 Tablet)  in the PM What changed:    how much to take  how to take this  when to take this  additional instructions   NITROSTAT 0.4 MG SL tablet Generic drug:  nitroGLYCERIN DISSOLVE ONE TABLET UNDER TONGUE EVERY 5 MINUTES UP TO 3 DOSES AS  NEEDED FOR CHEST PAIN   oxyCODONE-acetaminophen 5-325 MG tablet Commonly known as:  PERCOCET/ROXICET Take 1-2 tablets by mouth every 4 (four) hours as needed for moderate pain.   pantoprazole 40 MG tablet Commonly known as:  PROTONIX TAKE ONE TABLET BY MOUTH DAILY AT 6 AM   sodium chloride irrigation 0.9 % irrigation Irrigate with 200 mLs as directed daily. IRRIGATE AFFECTED AREA OF FOOT (SPIDER BITE) ONCE DAILY   VENTOLIN HFA 108 (90 Base) MCG/ACT inhaler Generic drug:  albuterol INHALE BY MOUTH AS DIRECTED What changed:  See the new instructions.   WOUND GEL EX Apply 1 application topically daily. HEALING GEL APPLIED ONCE DAILY AFTER IRRIGATING WOUND (SPIDER BITE)      Activity: activity as tolerated Diet: regular diet Wound Care: wet to dry dressing changes with [facility] RN  Follow-up with Dr. Randie Heinzain in 2 weeks.  SignedEmilie Parker: Eugene Parker 11/06/2017 10:25 AM

## 2017-11-06 NOTE — Discharge Instructions (Signed)
Take antibiotics as prescribed.  Continued wound care as recommended by Dr. Randie Heinzain.  Follow-up with Dr. Randie Heinzain in his office in the next 1 to 2 days.

## 2017-11-06 NOTE — ED Provider Notes (Signed)
[facility] EMERGENCY DEPARTMENT Provider Note   CSN: [id number] Arrival date & time: 11/06/17  1652     History   Chief Complaint Chief Complaint  Patient presents with  . Post-op Problem    HPI Eugene Parker is a 57 y.o. male.  HPI   57 year old male presenting for wound check.  He had right femoropopliteal bypass surgery on 7/18.  He reports that he has been overall recovering well but has had some increasing swelling in his right lower extremity and noticed some mild redness around some of his incisions.  His pain has progressively improving.  No drainage.  Past Medical History:  Diagnosis Date  . Anxiety   . CAD in native artery    a. STEMI 06/2014 s/p DES to Cx.  . Cellulitis and abscess of foot 03/22/2016  . Collagen vascular disease (HCC)   . Current smoker   . DJD (degenerative joint disease), lumbosacral   . Essential hypertension   . Heart attack (HCC)   . Hyperlipemia   . PAD (peripheral artery disease) (HCC)   . Stroke (cerebrum) [facility])     Patient Active Problem List   Diagnosis Date Noted  . PAD (peripheral artery disease) (HCC) 11/01/2017  . Cellulitis and abscess of foot 03/22/2016  . PVD (peripheral vascular disease) (HCC) 03/22/2016  . Hyperlipidemia 07/06/2014  . Elevated LFTs 07/06/2014  . ST elevation myocardial infarction (STEMI) of inferior wall (HCC) 07/04/2014  . Current smoker 07/04/2014  . Essential hypertension 07/04/2014    Past Surgical History:  Procedure Laterality Date  . ABDOMINAL AORTOGRAM W/LOWER EXTREMITY N/A 12/21/2016   Procedure: ABDOMINAL AORTOGRAM W/LOWER EXTREMITY;  Surgeon: Maeola Harman, MD;  Location: [facility] INVASIVE CV LAB;  Service: Cardiovascular;  Laterality: N/A;  . ABDOMINAL AORTOGRAM W/LOWER EXTREMITY N/A 11/01/2017   Procedure: ABDOMINAL AORTOGRAM W/LOWER EXTREMITY;  Surgeon: Maeola Harman, MD;  Location: [facility] INVASIVE CV LAB;  Service: Cardiovascular;  Laterality: N/A;  .  ENDARTERECTOMY FEMORAL Right 11/02/2017   Procedure: ENDARTERECTOMY RIGHT FEMORAL ARTERY;  Surgeon: Maeola Harman, MD;  Location: [facility] OR;  Service: Vascular;  Laterality: Right;  . FEMORAL-POPLITEAL BYPASS GRAFT Right 11/02/2017   Procedure: BYPASS GRAFT RIGHT FEMORAL TO BELOW KNEE POPLITEAL ARTERY USING RIGHT GREAT SAPHENOUS VEIN ;  Surgeon: Maeola Harman, MD;  Location: [facility] OR;  Service: Vascular;  Laterality: Right;  . INTRADISCAL INJECTION  Oct 2012   L5-S1  . LEFT HEART CATHETERIZATION WITH CORONARY ANGIOGRAM N/A 07/04/2014   Procedure: LEFT HEART CATHETERIZATION WITH CORONARY ANGIOGRAM;  Surgeon: Runell Gess, MD;  Location: [facility] CATH LAB;  Service: Cardiovascular;  Laterality: N/A;  . LEG SURGERY    . PERIPHERAL VASCULAR CATHETERIZATION N/A 02/03/2016   Procedure: Abdominal Aortogram;  Surgeon: Maeola Harman, MD;  Location: [facility] INVASIVE CV LAB;  Service: Cardiovascular;  Laterality: N/A;  . PERIPHERAL VASCULAR CATHETERIZATION N/A 02/03/2016   Procedure: Lower Extremity Angiography;  Surgeon: Maeola Harman, MD;  Location: [facility] INVASIVE CV LAB;  Service: Cardiovascular;  Laterality: N/A;  . PERIPHERAL VASCULAR CATHETERIZATION Right 02/03/2016   Procedure: Peripheral Vascular Intervention;  Surgeon: Maeola Harman, MD;  Location: [facility] INVASIVE CV LAB;  Service: Cardiovascular;  Laterality: Right;  SFA  . VEIN HARVEST Right 11/02/2017   Procedure: VEIN HARVEST RIGHT GREAT SAPHENOUS VEIN;  Surgeon: Maeola Harman, MD;  Location: [facility] OR;  Service: Vascular;  Laterality: Right;        Home Medications    Prior to Admission medications  Medication Sig Start Date End Date Taking? Authorizing Provider  ALPRAZolam Prudy Feeler) 0.5 MG tablet Take 0.5 mg by mouth 3 (three) times daily as needed for anxiety.    [provider]  apixaban (ELIQUIS) 5 MG TABS tablet Take 1 tablet (5 mg total) by mouth 2 (two) times daily. 09/27/17    Antoine Poche, MD  aspirin 81 MG EC tablet Take 1 tablet (81 mg total) by mouth daily. 11/04/17   Fransisco Hertz, MD  atorvastatin (LIPITOR) 80 MG tablet Take 1 tablet (80 mg total) by mouth daily. Patient taking differently: Take 80 mg by mouth every evening.  12/26/16   Antoine Poche, MD  gabapentin (NEURONTIN) 600 MG tablet Take 300 mg by mouth 3 (three) times daily. 10/26/17   [provider]  lisinopril (PRINIVIL,ZESTRIL) 10 MG tablet Take 10 mg by mouth daily. 09/01/17   [provider]  metoprolol tartrate (LOPRESSOR) 25 MG tablet Take 25 mg ( 1 Tablet)  in the AM and Take 37.5 mg ( 1 1/2 Tablet)  in the PM Patient taking differently: Take 25 mg by mouth 2 (two) times daily.  02/20/17   Jodelle Gross, NP  NITROSTAT 0.4 MG SL tablet DISSOLVE ONE TABLET UNDER TONGUE EVERY 5 MINUTES UP TO 3 DOSES AS NEEDED FOR CHEST PAIN 06/14/16   Robbie Lis M, PA-C  oxyCODONE-acetaminophen (PERCOCET/ROXICET) 5-325 MG tablet Take 1-2 tablets by mouth every 4 (four) hours as needed for moderate pain. 11/04/17 12/04/17  Fransisco Hertz, MD  pantoprazole (PROTONIX) 40 MG tablet TAKE ONE TABLET BY MOUTH DAILY AT 6 AM 10/03/17   Antoine Poche, MD  sodium chloride irrigation 0.9 % irrigation Irrigate with 200 mLs as directed daily. IRRIGATE AFFECTED AREA OF FOOT (SPIDER BITE) ONCE DAILY    [provider]  VENTOLIN HFA 108 (90 Base) MCG/ACT inhaler INHALE BY MOUTH AS DIRECTED Patient taking differently: INHALE 1-2 PUFFS EVERY 4-6 HOURS AS NEEDED FOR WHEEZING OR SHORTNESS OF BREATH 07/07/17   Antoine Poche, MD  Wound Dressings (WOUND GEL EX) Apply 1 application topically daily. HEALING GEL APPLIED ONCE DAILY AFTER IRRIGATING WOUND (SPIDER BITE)    [provider]    Family History Family History  Problem Relation Age of Onset  . Heart disease Mother   . COPD Mother   . Heart disease Father     Social History Social History   Tobacco Use  . Smoking  status: Current Every Day Smoker    Packs/day: 1.00    Years: 40.00    Pack years: 40.00    Types: Cigarettes    Start date: 04/22/1970  . Smokeless tobacco: Never Used  . Tobacco comment: 1 pack per 2 days  Substance Use Topics  . Alcohol use: Yes    Alcohol/week: 0.0 oz    Comment: occasisional  . Drug use: Yes    Types: Marijuana     Allergies   Codeine   Review of Systems Review of Systems  All systems reviewed and negative, other than as noted in HPI.  Physical Exam Updated Vital Signs BP 139/65   Pulse 77   Temp 98.5 F (36.9 C) (Oral)   Resp 20   Ht 5\' 11"  (1.803 m)   Wt 93 kg (205 lb)   SpO2 100%   BMI 28.59 kg/m   Physical Exam  Constitutional: He appears well-developed and well-nourished. No distress.  HENT:  Head: Normocephalic and atraumatic.  Eyes: Conjunctivae are normal. Right eye  exhibits no discharge. Left eye exhibits no discharge.  Neck: Neck supple.  Cardiovascular: Normal rate, regular rhythm and normal heart sounds. Exam reveals no gallop and no friction rub.  No murmur heard. Pulmonary/Chest: Effort normal and breath sounds normal. No respiratory distress.  Abdominal: Soft. He exhibits no distension. There is no tenderness.  Musculoskeletal: He exhibits no edema or tenderness.  Easy to doppler DP and PT pulse RLE.  Ulceration to the medial aspect of the mid right foot.  Does not appear to be infected.  All the surgical wounds are intact.  Scant serous drainage of the wound to his proximal thigh.  Diffuse swelling of the right lower extremity.  Neurological: He is alert.  Skin: Skin is warm and dry.  Psychiatric: He has a normal mood and affect. His behavior is normal. Thought content normal.  Nursing note and vitals reviewed.    ED Treatments / Results  Labs (all labs ordered are listed, but only abnormal results are displayed) Labs Reviewed - No data to display  EKG None  Radiology No results found.  Procedures Procedures  (including critical care time)  Medications Ordered in ED Medications  cephALEXin (KEFLEX) capsule 500 mg (has no administration in time range)     Initial Impression / Assessment and Plan / ED Course  I have reviewed the triage vital signs and the nursing notes.  Pertinent labs & imaging results that were available during my care of the patient were reviewed by me and considered in my medical decision making (see chart for details).     57 year old male with right lower extremity swelling a few days postop after right femoropopliteal bypass surgery.  Physical exam findings do not seem to out of line for expected healing this point.  I cannot palpate pulses in his foot but there is a lot of swelling.  They were easy to obtain with Doppler.  Will place him on antibiotics until he can follow-up with vascular surgery though.  Patient states that he should be able to see them in the office tomorrow.  Final Clinical Impressions(s) / ED Diagnoses   Final diagnoses:  Visit for wound check    ED Discharge Orders    None       Raeford RazorKohut, Orval Dortch, MD 11/12/17 [phone]

## 2017-11-06 NOTE — ED Triage Notes (Signed)
Pt had a graft placed in his RT leg on Thursday. Pt reports increased redness and swelling in leg.

## 2017-11-10 ENCOUNTER — Telehealth: Payer: Self-pay | Admitting: *Deleted

## 2017-11-10 ENCOUNTER — Encounter (HOSPITAL_COMMUNITY): Payer: Self-pay | Admitting: Anesthesiology

## 2017-11-10 ENCOUNTER — Other Ambulatory Visit: Payer: Self-pay | Admitting: *Deleted

## 2017-11-10 ENCOUNTER — Other Ambulatory Visit: Payer: Self-pay

## 2017-11-10 MED ORDER — OXYCODONE-ACETAMINOPHEN 5-325 MG PO TABS: 1.0000 | ORAL_TABLET | Freq: Four times a day (QID) | ORAL | 0 refills | Status: DC | PRN

## 2017-11-10 NOTE — Telephone Encounter (Signed)
Patient called requesting refill on narcotic pain med. Rx for 30 written on 11/03/17. Instructed patient to take Narcotic for severe pain only. Use non-narcotic pain med at other times. Rx. Refill allowed by Dr. Randie Heinzain and called patient's father to pick up. Informed father that patient need to use non narcotic pain med.

## 2017-11-13 NOTE — Progress Notes (Signed)
Chart reviewed by Dr Mal AmabileBrock, reviewed cardiac clearance from Dr Wyline MoodBranch, [place] [facility].

## 2017-11-16 ENCOUNTER — Ambulatory Visit (HOSPITAL_BASED_OUTPATIENT_CLINIC_OR_DEPARTMENT_OTHER): Admission: RE | Admit: 2017-11-16 | Payer: Medicare Other | Source: Ambulatory Visit | Admitting: Oral Surgery

## 2017-11-16 ENCOUNTER — Encounter (HOSPITAL_BASED_OUTPATIENT_CLINIC_OR_DEPARTMENT_OTHER): Admission: RE | Payer: Self-pay | Source: Ambulatory Visit

## 2017-11-16 SURGERY — MULTIPLE EXTRACTION WITH ALVEOLOPLASTY
Anesthesia: General | Laterality: Bilateral

## 2017-11-17 ENCOUNTER — Encounter: Payer: Self-pay | Admitting: Vascular Surgery

## 2017-11-17 ENCOUNTER — Ambulatory Visit (INDEPENDENT_AMBULATORY_CARE_PROVIDER_SITE_OTHER): Payer: Self-pay | Admitting: Vascular Surgery

## 2017-11-17 ENCOUNTER — Other Ambulatory Visit: Payer: Self-pay

## 2017-11-17 VITALS — BP 113/73 | HR 62 | Temp 98.7°F | Resp 16 | Ht 70.5 in | Wt 210.0 lb

## 2017-11-17 DIAGNOSIS — I739 Peripheral vascular disease, unspecified: Secondary | ICD-10-CM

## 2017-11-17 MED ORDER — OXYCODONE-ACETAMINOPHEN 5-325 MG PO TABS: 1.0000 | ORAL_TABLET | ORAL | 0 refills | Status: DC | PRN

## 2017-11-17 NOTE — Progress Notes (Signed)
  Subjective:     Patient ID: Eugene Parker, male   DOB: 03/09/1961, 57 y.o.   MRN: [id number]  HPI 57 year old male with right foot wound that is chronic underwent right femoral to popliteal artery bypass below the knee with vein.  He also had a concomitant femoral endarterectomy on the right.  He now presents with swelling of his leg.  States that his wound is getting better with wet-to-dry dressings by both he and his home health nurse.  He has been seen in the ER for swelling but was reassured at that time and states that the swelling is at this time improving.   Review of Systems Right lower extremity edema Right foot wound improving    Objective:   Physical Exam Awake alert oriented Incisions healing well Nonpitting edema entire right lower extremity Strong DP PT signals       Assessment/Plan     57 year old male status post right femoropopliteal bypass with vein.  He does have expected swelling and I have recommended elevation of his leg when recumbent and he states that this does help.  He continues wound care and wound is actually looking quite healthy today in the office.  Dressing was placed.  He will follow-up in 6 weeks to months with right lower extremity duplex and ABIs.  Brandon C. Randie Heinzain, MD [facility] Office: [phone] Pager: [phone]

## 2017-11-23 ENCOUNTER — Other Ambulatory Visit: Payer: Self-pay

## 2017-11-23 DIAGNOSIS — I739 Peripheral vascular disease, unspecified: Secondary | ICD-10-CM

## 2017-11-24 ENCOUNTER — Encounter: Payer: Medicare Other | Admitting: Vascular Surgery

## 2018-01-12 ENCOUNTER — Inpatient Hospital Stay (HOSPITAL_COMMUNITY): Admission: RE | Admit: 2018-01-12 | Payer: Medicare Other | Source: Ambulatory Visit

## 2018-01-12 ENCOUNTER — Encounter (HOSPITAL_COMMUNITY): Payer: Medicare Other

## 2018-01-12 ENCOUNTER — Ambulatory Visit: Payer: Medicare Other | Admitting: Vascular Surgery

## 2018-01-31 NOTE — H&P (Signed)
HISTORY AND PHYSICAL  Eugene Parker is a 57 y.o. male patient with CC: painful teeth. Referred by general dentist for full mouth extractions.  No diagnosis found.  Past Medical History:  Diagnosis Date  . Anxiety   . CAD in native artery    a. STEMI 06/2014 s/p DES to Cx.  . Cellulitis and abscess of foot 03/22/2016  . Collagen vascular disease (HCC)   . Current smoker   . DJD (degenerative joint disease), lumbosacral   . Essential hypertension   . Heart attack (HCC)   . Hyperlipemia   . PAD (peripheral artery disease) (HCC)   . Stroke (cerebrum) (HCC)     Current Facility-Administered Medications  Medication Dose Route Frequency Provider Last Rate Last Dose  . buPROPion (WELLBUTRIN XL) 24 hr tablet 300 mg  300 mg Oral Daily Maeola Harman, MD       Current Outpatient Medications  Medication Sig Dispense Refill  . ALPRAZolam (XANAX) 0.5 MG tablet Take 1 mg by mouth 2 (two) times daily.     Marland Kitchen apixaban (ELIQUIS) 5 MG TABS tablet Take 1 tablet (5 mg total) by mouth 2 (two) times daily. 60 tablet 0  . aspirin 81 MG EC tablet Take 1 tablet (81 mg total) by mouth daily.    Marland Kitchen lisinopril (PRINIVIL,ZESTRIL) 10 MG tablet Take 10 mg by mouth daily.  11  . metoprolol tartrate (LOPRESSOR) 25 MG tablet Take 25 mg ( 1 Tablet)  in the AM and Take 37.5 mg ( 1 1/2 Tablet)  in the PM (Patient taking differently: Take 25 mg by mouth 2 (two) times daily. ) 75 tablet 6  . NITROSTAT 0.4 MG SL tablet DISSOLVE ONE TABLET UNDER TONGUE EVERY 5 MINUTES UP TO 3 DOSES AS NEEDED FOR CHEST PAIN (Patient taking differently: Place 0.4 mg under the tongue every 5 (five) minutes as needed for chest pain. ) 25 tablet 2  . Oxycodone HCl 20 MG TABS Take 20 mg by mouth 4 (four) times daily as needed (pain).    . pantoprazole (PROTONIX) 40 MG tablet TAKE ONE TABLET BY MOUTH DAILY AT 6 AM 30 tablet 6  . sodium chloride irrigation 0.9 % irrigation Irrigate with 200 mLs as directed daily. IRRIGATE  AFFECTED AREA OF FOOT (SPIDER BITE) ONCE DAILY    . VENTOLIN HFA 108 (90 Base) MCG/ACT inhaler INHALE BY MOUTH AS DIRECTED (Patient taking differently: Inhale 2 puffs into the lungs every 6 (six) hours as needed for wheezing or shortness of breath. ) 18 g 1  . Wound Dressings (WOUND GEL EX) Apply 1 application topically daily. HEALING GEL APPLIED ONCE DAILY AFTER IRRIGATING WOUND (SPIDER BITE)    . atorvastatin (LIPITOR) 80 MG tablet Take 1 tablet (80 mg total) by mouth daily. (Patient not taking: Reported on 01/29/2018) 90 tablet 3  . cephALEXin (KEFLEX) 500 MG capsule Take 1 capsule (500 mg total) by mouth 3 (three) times daily. (Patient not taking: Reported on 11/17/2017) 21 capsule 0  . oxyCODONE-acetaminophen (PERCOCET/ROXICET) 5-325 MG tablet Take 1 tablet by mouth every 6 (six) hours as needed (severe pain). (Patient not taking: Reported on 11/17/2017) 30 tablet 0  . oxyCODONE-acetaminophen (PERCOCET/ROXICET) 5-325 MG tablet Take 1 tablet by mouth every 4 (four) hours as needed for severe pain. (Patient not taking: Reported on 01/29/2018) 30 tablet 0   Allergies  Allergen Reactions  . Codeine Nausea Only   Active Problems:   * No active hospital problems. *  Vitals: There were no  vitals taken for this visit. Lab results:No results found for this or any previous visit (from the past 24 hour(s)). Radiology Results: No results found. General appearance: alert, cooperative and no distress Head: Normocephalic, without obvious abnormality, atraumatic Eyes: negative Nose: Nares normal. Septum midline. Mucosa normal. No drainage or sinus tenderness. Throat: multiple carious and abscessed teeth. No purulence edema, trismus. Pharynx clear.  Neck: no adenopathy, supple, symmetrical, trachea midline and thyroid not enlarged, symmetric, no tenderness/mass/nodules Resp: clear to auscultation bilaterally Cardio: regular rate and rhythm, S1, S2 normal, no murmur, click, rub or gallop  Assessment: All  teeth nonrestorable secondary to dental caries and periodontitis.   Plan: full mouth extractions with alveoloplasty. GA. Day surgery.    Ocie Doyne 01/31/2018

## 2018-02-01 ENCOUNTER — Encounter (HOSPITAL_COMMUNITY): Payer: Self-pay | Admitting: *Deleted

## 2018-02-01 ENCOUNTER — Other Ambulatory Visit: Payer: Self-pay

## 2018-02-01 NOTE — Progress Notes (Signed)
Pt denies SOB and chest pain. Pt under the care of Dr. Wyline Mood, Cardiology. Pt denies having a chest x ray within the last year. Pt denies recent labs. Pt stated that last dose of Eliquis was Tuesday as instructed by MD. Pt denies recent labs. Pt made aware to stop taking vitamins, fish oil and herbal medications.  Do not take any NSAIDs ie: Ibuprofen, Advil, Naproxen (Aleve), Motrin, BC and Goody Powder. Pt verbalized understanding of all pre-op instructions. Anesthesia asked to review pt history; no new orders.

## 2018-02-01 NOTE — Progress Notes (Signed)
   02/01/18 1511  OBSTRUCTIVE SLEEP APNEA  Have you ever been diagnosed with sleep apnea through a sleep study? No  Do you snore loudly (loud enough to be heard through closed doors)?  1  Do you often feel tired, fatigued, or sleepy during the daytime (such as falling asleep during driving or talking to someone)? 0  Has anyone observed you stop breathing during your sleep? 0  Do you have, or are you being treated for high blood pressure? 1  BMI more than 35 kg/m2? 0  Age > 50 (1-yes) 1  Neck circumference greater than:Male 16 inches or larger, Male 17inches or larger?  (assess dos)  Male Gender (Yes=1) 1  Obstructive Sleep Apnea Score 4

## 2018-02-02 ENCOUNTER — Ambulatory Visit (HOSPITAL_COMMUNITY)
Admission: RE | Admit: 2018-02-02 | Discharge: 2018-02-02 | Disposition: A | Discharge status: Home / Self Care | Payer: Medicare Other | Source: Ambulatory Visit | Attending: Oral Surgery | Admitting: Oral Surgery

## 2018-02-02 ENCOUNTER — Ambulatory Visit (HOSPITAL_COMMUNITY): Payer: Medicare Other | Admitting: Physician Assistant

## 2018-02-02 ENCOUNTER — Encounter (HOSPITAL_COMMUNITY)
Admission: RE | Disposition: A | Discharge status: Home / Self Care | Payer: Self-pay | Source: Ambulatory Visit | Attending: Oral Surgery

## 2018-02-02 ENCOUNTER — Encounter (HOSPITAL_COMMUNITY): Payer: Self-pay | Admitting: Anesthesiology

## 2018-02-02 DIAGNOSIS — K0530 Chronic periodontitis, unspecified: Secondary | ICD-10-CM | POA: Diagnosis not present

## 2018-02-02 DIAGNOSIS — E785 Hyperlipidemia, unspecified: Secondary | ICD-10-CM | POA: Diagnosis not present

## 2018-02-02 DIAGNOSIS — F17210 Nicotine dependence, cigarettes, uncomplicated: Secondary | ICD-10-CM | POA: Insufficient documentation

## 2018-02-02 DIAGNOSIS — I739 Peripheral vascular disease, unspecified: Secondary | ICD-10-CM | POA: Insufficient documentation

## 2018-02-02 DIAGNOSIS — Z79899 Other long term (current) drug therapy: Secondary | ICD-10-CM | POA: Diagnosis not present

## 2018-02-02 DIAGNOSIS — Z8673 Personal history of transient ischemic attack (TIA), and cerebral infarction without residual deficits: Secondary | ICD-10-CM | POA: Insufficient documentation

## 2018-02-02 DIAGNOSIS — F419 Anxiety disorder, unspecified: Secondary | ICD-10-CM | POA: Insufficient documentation

## 2018-02-02 DIAGNOSIS — K029 Dental caries, unspecified: Secondary | ICD-10-CM | POA: Diagnosis present

## 2018-02-02 DIAGNOSIS — Z7901 Long term (current) use of anticoagulants: Secondary | ICD-10-CM | POA: Insufficient documentation

## 2018-02-02 DIAGNOSIS — I10 Essential (primary) hypertension: Secondary | ICD-10-CM | POA: Diagnosis not present

## 2018-02-02 DIAGNOSIS — Z7982 Long term (current) use of aspirin: Secondary | ICD-10-CM | POA: Diagnosis not present

## 2018-02-02 DIAGNOSIS — I2510 Atherosclerotic heart disease of native coronary artery without angina pectoris: Secondary | ICD-10-CM | POA: Diagnosis not present

## 2018-02-02 DIAGNOSIS — Z955 Presence of coronary angioplasty implant and graft: Secondary | ICD-10-CM | POA: Diagnosis not present

## 2018-02-02 DIAGNOSIS — I252 Old myocardial infarction: Secondary | ICD-10-CM | POA: Insufficient documentation

## 2018-02-02 HISTORY — DX: Hiccough: R06.6

## 2018-02-02 HISTORY — PX: TOOTH EXTRACTION: SHX859

## 2018-02-02 HISTORY — DX: Dental caries, unspecified: K02.9

## 2018-02-02 LAB — PROTIME-INR
INR: 0.99
PROTHROMBIN TIME: 13 s (ref 11.4–15.2)

## 2018-02-02 LAB — BASIC METABOLIC PANEL
Anion gap: 8 (ref 5–15)
BUN: 10 mg/dL (ref 6–20)
CALCIUM: 9 mg/dL (ref 8.9–10.3)
CO2: 27 mmol/L (ref 22–32)
CREATININE: 0.82 mg/dL (ref 0.61–1.24)
Chloride: 101 mmol/L (ref 98–111)
Glucose, Bld: 131 mg/dL — ABNORMAL HIGH (ref 70–99)
Potassium: 4.2 mmol/L (ref 3.5–5.1)
SODIUM: 136 mmol/L (ref 135–145)

## 2018-02-02 LAB — HEMOGLOBIN: HEMOGLOBIN: 13.4 g/dL (ref 13.0–17.0)

## 2018-02-02 SURGERY — DENTAL RESTORATION/EXTRACTIONS
Anesthesia: General | Site: Mouth

## 2018-02-02 MED ORDER — ONDANSETRON HCL 4 MG/2ML IJ SOLN
INTRAMUSCULAR | Status: AC
  Filled 2018-02-02: qty 2

## 2018-02-02 MED ORDER — OXYCODONE HCL 5 MG PO TABS: 5.0000 mg | ORAL_TABLET | Freq: Once | ORAL | Status: AC | PRN

## 2018-02-02 MED ORDER — PHENYLEPHRINE 40 MCG/ML (10ML) SYRINGE FOR IV PUSH (FOR BLOOD PRESSURE SUPPORT)
PREFILLED_SYRINGE | INTRAVENOUS | Status: AC
  Filled 2018-02-02: qty 10

## 2018-02-02 MED ORDER — OXYMETAZOLINE HCL 0.05 % NA SOLN
NASAL | Status: AC
  Filled 2018-02-02: qty 15

## 2018-02-02 MED ORDER — AMOXICILLIN 500 MG PO CAPS: 500.0000 mg | ORAL_CAPSULE | Freq: Three times a day (TID) | ORAL | 0 refills | Status: DC

## 2018-02-02 MED ORDER — FENTANYL CITRATE (PF) 100 MCG/2ML IJ SOLN
INTRAMUSCULAR | Status: DC | PRN
  Administered 2018-02-02 (×4): 50 ug via INTRAVENOUS

## 2018-02-02 MED ORDER — OXYCODONE HCL 5 MG/5ML PO SOLN
5.0000 mg | Freq: Once | ORAL | Status: AC | PRN
  Administered 2018-02-02: 5 mg via ORAL

## 2018-02-02 MED ORDER — DEXAMETHASONE SODIUM PHOSPHATE 4 MG/ML IJ SOLN
INTRAMUSCULAR | Status: DC | PRN
  Administered 2018-02-02: 10 mg via INTRAVENOUS

## 2018-02-02 MED ORDER — OXYCODONE HCL 5 MG PO TABS: 10.0000 mg | ORAL_TABLET | Freq: Four times a day (QID) | ORAL | 0 refills | Status: DC | PRN

## 2018-02-02 MED ORDER — 0.9 % SODIUM CHLORIDE (POUR BTL) OPTIME
TOPICAL | Status: DC | PRN
  Administered 2018-02-02: 1000 mL

## 2018-02-02 MED ORDER — PHENYLEPHRINE 40 MCG/ML (10ML) SYRINGE FOR IV PUSH (FOR BLOOD PRESSURE SUPPORT)
PREFILLED_SYRINGE | INTRAVENOUS | Status: AC
  Filled 2018-02-02: qty 20

## 2018-02-02 MED ORDER — SUCCINYLCHOLINE CHLORIDE 200 MG/10ML IV SOSY
PREFILLED_SYRINGE | INTRAVENOUS | Status: AC
  Filled 2018-02-02: qty 10

## 2018-02-02 MED ORDER — LIDOCAINE-EPINEPHRINE 2 %-1:100000 IJ SOLN
INTRAMUSCULAR | Status: AC
  Filled 2018-02-02: qty 1

## 2018-02-02 MED ORDER — MIDAZOLAM HCL 5 MG/5ML IJ SOLN
INTRAMUSCULAR | Status: DC | PRN
  Administered 2018-02-02: 2 mg via INTRAVENOUS

## 2018-02-02 MED ORDER — FENTANYL CITRATE (PF) 250 MCG/5ML IJ SOLN
INTRAMUSCULAR | Status: AC
  Filled 2018-02-02: qty 5

## 2018-02-02 MED ORDER — LIDOCAINE-EPINEPHRINE 2 %-1:100000 IJ SOLN
INTRAMUSCULAR | Status: DC | PRN
  Administered 2018-02-02: 20 mL

## 2018-02-02 MED ORDER — SODIUM CHLORIDE 0.9 % IR SOLN
Status: DC | PRN
  Administered 2018-02-02: 1000 mL

## 2018-02-02 MED ORDER — LIDOCAINE 2% (20 MG/ML) 5 ML SYRINGE
INTRAMUSCULAR | Status: DC | PRN
  Administered 2018-02-02: 100 mg via INTRAVENOUS

## 2018-02-02 MED ORDER — CEFAZOLIN SODIUM-DEXTROSE 2-4 GM/100ML-% IV SOLN
INTRAVENOUS | Status: AC
  Filled 2018-02-02: qty 100

## 2018-02-02 MED ORDER — DEXAMETHASONE SODIUM PHOSPHATE 10 MG/ML IJ SOLN
INTRAMUSCULAR | Status: AC
  Filled 2018-02-02: qty 1

## 2018-02-02 MED ORDER — FENTANYL CITRATE (PF) 100 MCG/2ML IJ SOLN: 25.0000 ug | INTRAMUSCULAR | Status: DC | PRN

## 2018-02-02 MED ORDER — SUCCINYLCHOLINE CHLORIDE 20 MG/ML IJ SOLN
INTRAMUSCULAR | Status: DC | PRN
  Administered 2018-02-02: 100 mg via INTRAVENOUS

## 2018-02-02 MED ORDER — LACTATED RINGERS IV SOLN
INTRAVENOUS | Status: DC
  Administered 2018-02-02: 08:00:00 via INTRAVENOUS

## 2018-02-02 MED ORDER — LIDOCAINE 2% (20 MG/ML) 5 ML SYRINGE
INTRAMUSCULAR | Status: AC
  Filled 2018-02-02: qty 5

## 2018-02-02 MED ORDER — OXYCODONE HCL 5 MG/5ML PO SOLN
ORAL | Status: AC
  Filled 2018-02-02: qty 5

## 2018-02-02 MED ORDER — ONDANSETRON HCL 4 MG/2ML IJ SOLN
INTRAMUSCULAR | Status: DC | PRN
  Administered 2018-02-02: 4 mg via INTRAVENOUS

## 2018-02-02 MED ORDER — PROPOFOL 10 MG/ML IV BOLUS
INTRAVENOUS | Status: DC | PRN
  Administered 2018-02-02: 120 mg via INTRAVENOUS

## 2018-02-02 MED ORDER — ONDANSETRON HCL 4 MG/2ML IJ SOLN: 4.0000 mg | Freq: Once | INTRAMUSCULAR | Status: DC | PRN

## 2018-02-02 MED ORDER — CEFAZOLIN SODIUM-DEXTROSE 2-4 GM/100ML-% IV SOLN
2.0000 g | INTRAVENOUS | Status: AC
  Administered 2018-02-02: 2 g via INTRAVENOUS

## 2018-02-02 MED ORDER — MIDAZOLAM HCL 2 MG/2ML IJ SOLN
INTRAMUSCULAR | Status: AC
  Filled 2018-02-02: qty 2

## 2018-02-02 SURGICAL SUPPLY — 35 items
BUR CROSS CUT FISSURE 1.6 (BURR) ×2 IMPLANT
BUR CROSS CUT FISSURE 1.6MM (BURR) ×1
BUR EGG ELITE 4.0 (BURR) ×2 IMPLANT
BUR EGG ELITE 4.0MM (BURR) ×1
CANISTER SUCT 3000ML PPV (MISCELLANEOUS) ×3 IMPLANT
COVER SURGICAL LIGHT HANDLE (MISCELLANEOUS) ×3 IMPLANT
COVER WAND RF STERILE (DRAPES) ×3 IMPLANT
DECANTER SPIKE VIAL GLASS SM (MISCELLANEOUS) ×3 IMPLANT
DRAPE U-SHAPE 76X120 STRL (DRAPES) ×3 IMPLANT
GAUZE PACKING FOLDED 2  STR (GAUZE/BANDAGES/DRESSINGS) ×2
GAUZE PACKING FOLDED 2 STR (GAUZE/BANDAGES/DRESSINGS) ×1 IMPLANT
GLOVE BIO SURGEON STRL SZ 6.5 (GLOVE) ×1 IMPLANT
GLOVE BIO SURGEON STRL SZ7 (GLOVE) ×2 IMPLANT
GLOVE BIO SURGEON STRL SZ7.5 (GLOVE) ×3 IMPLANT
GLOVE BIO SURGEONS STRL SZ 6.5 (GLOVE) ×1
GLOVE BIOGEL PI IND STRL 6.5 (GLOVE) IMPLANT
GLOVE BIOGEL PI IND STRL 7.0 (GLOVE) IMPLANT
GLOVE BIOGEL PI INDICATOR 6.5 (GLOVE) ×2
GLOVE BIOGEL PI INDICATOR 7.0 (GLOVE)
GOWN STRL REUS W/ TWL LRG LVL3 (GOWN DISPOSABLE) ×1 IMPLANT
GOWN STRL REUS W/ TWL XL LVL3 (GOWN DISPOSABLE) ×1 IMPLANT
GOWN STRL REUS W/TWL LRG LVL3 (GOWN DISPOSABLE) ×2
GOWN STRL REUS W/TWL XL LVL3 (GOWN DISPOSABLE) ×2
IV NS 1000ML (IV SOLUTION) ×2
IV NS 1000ML BAXH (IV SOLUTION) ×1 IMPLANT
KIT BASIN OR (CUSTOM PROCEDURE TRAY) ×3 IMPLANT
KIT TURNOVER KIT B (KITS) ×3 IMPLANT
NEEDLE 22X1 1/2 (OR ONLY) (NEEDLE) ×6 IMPLANT
NS IRRIG 1000ML POUR BTL (IV SOLUTION) ×3 IMPLANT
PAD ARMBOARD 7.5X6 YLW CONV (MISCELLANEOUS) ×7 IMPLANT
SUT CHROMIC 3 0 PS 2 (SUTURE) ×3 IMPLANT
SYR CONTROL 10ML LL (SYRINGE) ×3 IMPLANT
TRAY ENT MC OR (CUSTOM PROCEDURE TRAY) ×3 IMPLANT
TUBING IRRIGATION (MISCELLANEOUS) ×3 IMPLANT
YANKAUER SUCT BULB TIP NO VENT (SUCTIONS) ×3 IMPLANT

## 2018-02-02 NOTE — Anesthesia Preprocedure Evaluation (Addendum)
Anesthesia Evaluation  Patient identified by MRN, date of birth, ID band Patient awake    Reviewed: Allergy & Precautions, NPO status , Patient's Chart, lab work & pertinent test results  History of Anesthesia Complications Negative for: history of anesthetic complications  Airway Mallampati: III  TM Distance: >3 FB Neck ROM: Full    Dental  (+) Poor Dentition Many missing and broken teeth. None loose. Very poor overall dentition.:   Pulmonary COPD,  COPD inhaler, Current Smoker,    breath sounds clear to auscultation       Cardiovascular hypertension, Pt. on medications and Pt. on home beta blockers + CAD, + Past MI, + Cardiac Stents and + Peripheral Vascular Disease   Rhythm:Regular Rate:Normal  TTE: EF 50-55%, grade II DD, no significant valvular abnormalities   Neuro/Psych PSYCHIATRIC DISORDERS Anxiety CVA    GI/Hepatic Neg liver ROS, GERD  ,  Endo/Other  negative endocrine ROS  Renal/GU negative Renal ROS  negative genitourinary   Musculoskeletal negative musculoskeletal ROS (+)   Abdominal   Peds  Hematology negative hematology ROS (+)   Anesthesia Other Findings   Reproductive/Obstetrics                            Anesthesia Physical Anesthesia Plan  ASA: III  Anesthesia Plan: General   Post-op Pain Management:    Induction: Intravenous  PONV Risk Score and Plan: 1 and Ondansetron, Dexamethasone, Treatment may vary due to age or medical condition and Midazolam  Airway Management Planned: Nasal ETT  Additional Equipment: None  Intra-op Plan:   Post-operative Plan: Extubation in OR  Informed Consent: I have reviewed the patients History and Physical, chart, labs and discussed the procedure including the risks, benefits and alternatives for the proposed anesthesia with the patient or authorized representative who has indicated his/her understanding and acceptance.      Plan Discussed with:   Anesthesia Plan Comments:        Anesthesia Quick Evaluation

## 2018-02-02 NOTE — H&P (Signed)
H&P documentation  -History and Physical Reviewed  -Patient has been re-examined  -No change in the plan of care  Eugene Parker  

## 2018-02-02 NOTE — Op Note (Signed)
02/02/2018  11:37 AM  PATIENT:  Eugene Parker  57 y.o. male  PRE-OPERATIVE DIAGNOSIS:  NON-RESTORABLE TEETH # 5, 6, 7, 8, 9, 10, 11, 12, 14, 19, 20, 21, 22, 27, 28, 29  POST-OPERATIVE DIAGNOSIS:  SAME  PROCEDURE:  Procedure(s): EXTRACTION TEETH # 5, 6, 7, 8, 9, 10, 11, 12, 14, 19, 20, 21, 22, 27, 28, 29; ALVEOLOPLASTY   SURGEON:  Surgeon(s): Ocie Doyne, DDS  ANESTHESIA:   local and general  EBL:  minimal  DRAINS: none   SPECIMEN:  No Specimen  COUNTS:  YES  PLAN OF CARE: Discharge to home after PACU  PATIENT DISPOSITION:  PACU - hemodynamically stable.   PROCEDURE DETAILS: Dictation # [id number]  Eugene Parker, DMD 02/02/2018 11:37 AM

## 2018-02-02 NOTE — Op Note (Signed)
NAME: Eugene Parker, Eugene Parker [facility] MEDICAL RECORD ZO:[id number] ACCOUNT [id number] DATE OF BIRTH:1960/10/25 FACILITY: [facility] LOCATION: [facility] PHYSICIAN:Ilya Ess M. Chaquana Nichols, DDS  OPERATIVE REPORT  DATE OF PROCEDURE:  02/02/2018  PREOPERATIVE DIAGNOSIS:  Nonrestorable teeth secondary to dental caries numbers 5, 6, 7, 8, 9, 10, 11, 12, 14, 19, 20, 21, 22, 27, 28, 29.  POSTOPERATIVE DIAGNOSIS:  Nonrestorable teeth secondary to dental caries numbers 5, 6, 7, 8, 9, 10, 11, 12, 14, 19, 20, 21, 22, 27, 28, 29.  PROCEDURE:  Extraction of teeth numbers 5, 6, 7, 8, 9, 10, 11, 12, 14, 19, 20, 21, 22, 27, 28, 29, alveoplasty right and left maxilla and mandible.  SURGEON:  Ocie Doyne, DDS  ANESTHESIA:  General nasal intubation.  DESCRIPTION OF PROCEDURE:  The patient was taken to the operating room and placed on the table in supine position.  General anesthesia was administered intravenously and a nasal endotracheal tube was placed and secured.  The eyes were protected and the  patient was draped for surgery.  A timeout was performed.  The posterior pharynx was suctioned and a throat pack was placed, 2% lidocaine 1:100,000 epinephrine was infiltrated in an inferior alveolar block on the right and left side and buccal and  palatal infiltration in the maxilla.  Additional anesthetic solution was deposited in the buccal vestibule of the mandible.  A total of 17 mL was utilized.  A bite block was placed on the right side of the mouth and a sweetheart retractor was used to  retract the tongue.  A 15 blade was used to make an incision beginning approximately 1 cm proximal to tooth 19 along the alveolar crest.  An incision was carried forward in the gingival sulcus both buccally and lingually around teeth numbers 20, 21, 22.   The periosteum was reflected from around these teeth.  The teeth were elevated with a 301 elevator and removed from the mouth with the dental forceps.  Then, the sockets were curetted.  The  periosteum was reflected to expose the alveolar crest.   Alveoplasty was performed using an egg bur under irrigation and a bone file.  Then, the area was irrigated and closed with 3-0 chromic.  In the maxilla a 15 blade was used to make an incision around teeth 14, 12, 11, 10, 9, 8 and 7 and the buccal and  palatal sulcus.  The periosteum was reflected.  The teeth were elevated with a 301 elevator and removed from the mouth with the dental forceps.  The sockets were curetted.  The periosteum was reflected to expose the alveolar crest and then alveoplasty  was performed using an egg bur and bone file.  Then, the area was irrigated and closed with 3-0 chromic.  The bite block and sweetheart retractor were repositioned to the other side of the mouth and a 15 blade was used to make an incision around teeth  numbers 5 and 6 in the maxilla and around teeth numbers 27, 28, and 29 and the mandible.  The periosteum was reflected.  Teeth were elevated with a 301 elevator and removed from the mouth with the dental forceps.  The sockets were then curetted.  The  periosteum was reflected to expose the alveolar crest and then alveoplasty was performed using the egg-shaped bur and bone file.  Then, the areas were irrigated and closed with 3-0 chromic.  The oral cavity was then inspected and found to have good  contour, hemostasis and closure.  The oral cavity was irrigated  and suctioned and a throat pack was removed.  The patient was left in care of anesthesia for extubation and transport to recovery room with plans for discharge home through day surgery.  ESTIMATED BLOOD LOSS:  Minimal.  COMPLICATIONS:  None.  SPECIMENS:  None.  TN/NUANCE  D:02/02/2018 T:02/02/2018 JOB:[id number]

## 2018-02-02 NOTE — Anesthesia Postprocedure Evaluation (Signed)
Anesthesia Post Note  Patient: Eugene Parker  Procedure(s) Performed: DENTAL RESTORATION/EXTRACTIONS (N/A Mouth)     Patient location during evaluation: PACU Anesthesia Type: General Level of consciousness: awake and alert Pain management: pain level controlled Vital Signs Assessment: post-procedure vital signs reviewed and stable Respiratory status: spontaneous breathing, nonlabored ventilation and respiratory function stable Cardiovascular status: blood pressure returned to baseline and stable Postop Assessment: no apparent nausea or vomiting Anesthetic complications: no    Last Vitals:  Vitals:   02/02/18 1301 02/02/18 1318  BP: (!) 176/99 (!) 191/95  Pulse: 75 73  Resp: 18   Temp:    SpO2: 96% 98%    Last Pain:  Vitals:   02/02/18 1215  TempSrc:   PainSc: Asleep                 Lowella Curb

## 2018-02-02 NOTE — Anesthesia Procedure Notes (Signed)
Procedure Name: Intubation Date/Time: 02/02/2018 10:57 AM Performed by: Lieutenant Diego, CRNA Pre-anesthesia Checklist: Patient identified, Emergency Drugs available, Suction available and Patient being monitored Patient Re-evaluated:Patient Re-evaluated prior to induction Oxygen Delivery Method: Circle system utilized Preoxygenation: Pre-oxygenation with 100% oxygen Induction Type: IV induction Ventilation: Mask ventilation without difficulty Laryngoscope Size: Mac, 3 and Glidescope Grade View: Grade I Nasal Tubes: Nasal prep performed and Nasal Rae Endobronchial tube: Right Tube size: 7.5 mm Placement Confirmation: ETT inserted through vocal cords under direct vision,  positive ETCO2 and breath sounds checked- equal and bilateral Secured at: 26 cm Tube secured with: Tape Dental Injury: Teeth and Oropharynx as per pre-operative assessment  Comments: Unable to passes nasal tube with mac blade despite grade 1 view. Used glide scope, passed with ease.

## 2018-02-02 NOTE — Transfer of Care (Signed)
Immediate Anesthesia Transfer of Care Note  Patient: Eugene Parker  Procedure(s) Performed: DENTAL RESTORATION/EXTRACTIONS (N/A Mouth)  Patient Location: PACU  Anesthesia Type:General  Level of Consciousness: sedated  Airway & Oxygen Therapy: Patient Spontanous Breathing and Patient connected to face mask oxygen  Post-op Assessment: Report given to RN and Post -op Vital signs reviewed and stable  Post vital signs: Reviewed and stable  Last Vitals:  Vitals Value Taken Time  BP 129/68 02/02/2018 11:46 AM  Temp    Pulse 71 02/02/2018 11:46 AM  Resp 13 02/02/2018 11:46 AM  SpO2 100 % 02/02/2018 11:46 AM  Vitals shown include unvalidated device data.  Last Pain:  Vitals:   02/02/18 0811  TempSrc:   PainSc: 0-No pain      Patients Stated Pain Goal: 3 (02/02/18 4098)  Complications: No apparent anesthesia complications

## 2018-02-03 ENCOUNTER — Encounter (HOSPITAL_COMMUNITY): Payer: Self-pay | Admitting: Oral Surgery

## 2018-02-07 ENCOUNTER — Other Ambulatory Visit: Payer: Self-pay | Admitting: Cardiology

## 2018-02-07 NOTE — Telephone Encounter (Signed)
Dr. Wyline Mood patient

## 2018-03-09 ENCOUNTER — Ambulatory Visit: Payer: Medicare Other | Admitting: Vascular Surgery

## 2018-03-09 ENCOUNTER — Ambulatory Visit (HOSPITAL_COMMUNITY): Payer: Medicare Other | Attending: Vascular Surgery

## 2018-03-09 ENCOUNTER — Ambulatory Visit (HOSPITAL_COMMUNITY): Payer: Medicare Other

## 2018-03-12 ENCOUNTER — Encounter: Payer: Self-pay | Admitting: Vascular Surgery

## 2018-04-20 ENCOUNTER — Encounter (HOSPITAL_COMMUNITY): Payer: Medicare Other

## 2018-04-20 ENCOUNTER — Ambulatory Visit: Payer: Medicare Other | Admitting: Vascular Surgery

## 2018-05-04 ENCOUNTER — Ambulatory Visit (INDEPENDENT_AMBULATORY_CARE_PROVIDER_SITE_OTHER): Payer: Medicare Other | Admitting: Vascular Surgery

## 2018-05-04 ENCOUNTER — Ambulatory Visit: Payer: Medicare Other | Admitting: Vascular Surgery

## 2018-05-04 ENCOUNTER — Encounter: Payer: Self-pay | Admitting: Vascular Surgery

## 2018-05-04 ENCOUNTER — Other Ambulatory Visit: Payer: Self-pay

## 2018-05-04 VITALS — BP 152/79 | HR 61 | Temp 97.4°F | Resp 20 | Ht 70.0 in | Wt 200.0 lb

## 2018-05-04 DIAGNOSIS — I739 Peripheral vascular disease, unspecified: Secondary | ICD-10-CM

## 2018-05-04 DIAGNOSIS — S91301D Unspecified open wound, right foot, subsequent encounter: Secondary | ICD-10-CM

## 2018-05-04 NOTE — Progress Notes (Signed)
Patient ID: Eugene Parker, male   DOB: 01/08/1961, 58 y.o.   MRN: [id number]  Reason for Consult: Follow-up (PVD)   Referred by Samuel JesterButler, Cynthia, DO  Subjective:     HPI:  Eugene Parker is a 58 y.o. male follows up for evaluation of right foot wound.  He previously underwent right femoral to popliteal artery bypass.  He is taking his Eliquis and aspirin.  States that he is performing wound care daily seems to be getting some better.  He is having pain right medial thigh incision as well as below the knee incision.  He does not have any ongoing wounds other than his foot which has been persistent for over 2 years.  From a depression standpoint he seems to be doing better states that he still misses his wife thinks about her daily as well as his mother but his father is also helping him through this.  Overall he appears better today.  Past Medical History:  Diagnosis Date  . Anxiety   . CAD in native artery    a. STEMI 06/2014 s/p DES to Cx.  . Cellulitis and abscess of foot 03/22/2016  . Collagen vascular disease (HCC)   . Current smoker   . Dental caries    periodontitis  . DJD (degenerative joint disease), lumbosacral   . Essential hypertension   . Heart attack (HCC)   . Hiccups   . Hyperlipemia   . PAD (peripheral artery disease) (HCC)   . 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 Heart disease Mother   . COPD Mother   . Heart disease Father    Past Surgical History:  Procedure Laterality Date  . ABDOMINAL AORTOGRAM W/LOWER EXTREMITY N/A 12/21/2016   Procedure: ABDOMINAL AORTOGRAM W/LOWER EXTREMITY;  Surgeon: Maeola Harmanain,  Christopher, MD;  [facility];  Service: Cardiovascular;  Laterality: N/A;  . ABDOMINAL AORTOGRAM W/LOWER EXTREMITY N/A 11/01/2017   Procedure: ABDOMINAL AORTOGRAM W/LOWER EXTREMITY;  Surgeon: Maeola Harmanain,  Christopher, MD;  [facility];  Service: Cardiovascular;   Laterality: N/A;  . ENDARTERECTOMY FEMORAL Right 11/02/2017   Procedure: ENDARTERECTOMY RIGHT FEMORAL ARTERY;  Surgeon: Maeola Harmanain,  Christopher, MD;  [facility];  Service: Vascular;  Laterality: Right;  . FEMORAL-POPLITEAL BYPASS GRAFT Right 11/02/2017   Procedure: BYPASS GRAFT RIGHT FEMORAL TO BELOW KNEE POPLITEAL ARTERY USING RIGHT GREAT SAPHENOUS VEIN ;  Surgeon: Maeola Harmanain,  Christopher, MD;  [facility];  Service: Vascular;  Laterality: Right;  . INTRADISCAL INJECTION  Oct 2012   L5-S1  . LEFT HEART CATHETERIZATION WITH CORONARY ANGIOGRAM N/A 07/04/2014   Procedure: LEFT HEART CATHETERIZATION WITH CORONARY ANGIOGRAM;  Surgeon: Runell GessJonathan J Berry, MD;  [facility];  Service: Cardiovascular;  Laterality: N/A;  . LEG SURGERY    . PERIPHERAL VASCULAR CATHETERIZATION N/A 02/03/2016   Procedure: Abdominal Aortogram;  Surgeon: Maeola Harman Christopher , MD;  [facility];  Service: Cardiovascular;  Laterality: N/A;  . PERIPHERAL VASCULAR CATHETERIZATION N/A 02/03/2016   Procedure: Lower Extremity Angiography;  Surgeon: Maeola Harman Christopher , MD;  [facility];  Service: Cardiovascular;  Laterality: N/A;  . PERIPHERAL VASCULAR CATHETERIZATION Right 02/03/2016   Procedure: Peripheral Vascular Intervention;  Surgeon: Maeola Harman Christopher , MD;  [facility];  Service: Cardiovascular;  Laterality: Right;  SFA  . TOOTH EXTRACTION N/A 02/02/2018   Procedure: DENTAL RESTORATION/EXTRACTIONS;  Surgeon: Ocie DoyneJensen, Scott, DDS;  Location:  [facility] OR;  Service: Oral Surgery;  Laterality: N/A;  . VEIN HARVEST Right 11/02/2017   Procedure: VEIN HARVEST RIGHT GREAT SAPHENOUS VEIN;  Surgeon: Maeola Harmanain,  Christopher, MD;  [facility];  Service: Vascular;  Laterality: Right;    Short Social History:  Social History   Tobacco Use  . Smoking status: Current Every Day Smoker    Packs/day: 1.00    Years: 40.00    Pack years: 40.00    Types:  Cigarettes    Start date: 04/22/1970  . Smokeless tobacco: Never Used  . Tobacco comment: 1 pack per 2 days  Substance Use Topics  . Alcohol use: Yes    Alcohol/week: 0.0 standard drinks    Comment: occasisional beer    Allergies  Allergen Reactions  . Codeine Nausea Only    Current Outpatient Medications  Medication Sig Dispense Refill  . ALPRAZolam (XANAX) 0.5 MG tablet Take 1 mg by mouth 2 (two) times daily.     Marland Kitchen. apixaban (ELIQUIS) 5 MG TABS tablet Take 1 tablet (5 mg total) by mouth 2 (two) times daily. 60 tablet 0  . aspirin 81 MG EC tablet Take 1 tablet (81 mg total) by mouth daily.    Marland Kitchen. lisinopril (PRINIVIL,ZESTRIL) 10 MG tablet Take 10 mg by mouth daily.  11  . metoprolol tartrate (LOPRESSOR) 25 MG tablet TAKE ONE TABLET BY MOUTH TWICE DAILY. 180 tablet 3  . nitroGLYCERIN (NITROSTAT) 0.4 MG SL tablet DISSOLVE ONE TABLET UNDER TONGUE EVERY 5 MINUTES UP TO 3 DOSES AS NEEDED FOR CHEST PAIN 25 tablet 2  . pantoprazole (PROTONIX) 40 MG tablet TAKE ONE TABLET BY MOUTH DAILY AT 6 AM 30 tablet 6  . sodium chloride irrigation 0.9 % irrigation Irrigate with 200 mLs as directed daily. IRRIGATE AFFECTED AREA OF FOOT (SPIDER BITE) ONCE DAILY    . VENTOLIN HFA 108 (90 Base) MCG/ACT inhaler INHALE BY MOUTH AS DIRECTED (Patient taking differently: Inhale 2 puffs into the lungs every 6 (six) hours as needed for wheezing or shortness of breath. ) 18 g 1  . Wound Dressings (WOUND GEL EX) Apply 1 application topically daily. HEALING GEL APPLIED ONCE DAILY AFTER IRRIGATING WOUND (SPIDER BITE)     No current facility-administered medications for this visit.     Review of Systems  Constitutional:  Constitutional negative. HENT: HENT negative.  Eyes: Eyes negative.  Respiratory: Respiratory negative.  Cardiovascular: Cardiovascular negative.  GI: Gastrointestinal negative.  Musculoskeletal: Musculoskeletal negative.  Skin: Skin negative.  Neurological: Neurological negative. Hematologic:  Hematologic/lymphatic negative.  Psychiatric: Psychiatric negative.        Objective:  Objective   Vitals:   05/04/18 1016  BP: (!) 152/79  Pulse: 61  Resp: 20  Temp: (!) 97.4 F (36.3 C)  SpO2: 100%  Weight: 200 lb (90.7 kg)  Height: 5\' 10"  (1.778 m)   Body mass index is 28.7 kg/m.  Physical Exam Cardiovascular:     Rate and Rhythm: Normal rate.     Pulses:          Femoral pulses are 2+ on the right side.      Popliteal pulses are 2+ on the right side.     Comments: Strong pt and dp signals on the right Pulmonary:     Effort: Pulmonary effort is normal.  Abdominal:     General: Abdomen is flat.  Musculoskeletal:     Right lower leg: 1+ Edema present.  Skin:    General: Skin is warm and dry.  Neurological:     General: No focal deficit present.     Mental Status: He is alert.  Psychiatric:        Mood and Affect: Mood normal.        Behavior: Behavior normal.        Thought Content: Thought content normal.        Judgment: Judgment normal.    Wound does appear to be healing well is contracted from previous.  I could not get image to upload today.  Data: No studies performed prior to today's visit.     Assessment/Plan:     58 year old male status post right femoropopliteal bypass.  He unfortunately follows up before his studies were performed.  We will get ABI and duplex of his bypass but he does have a palpable popliteal pulse to suggest bypass is patent with signals into the foot although I cannot palpate distal pulses.  We will refer him to pain management for his wound issues.  Continue wound care right lower extremity hopefully this will heal in the near future.     Maeola Harman MD [facility]

## 2018-05-10 ENCOUNTER — Other Ambulatory Visit: Payer: Self-pay

## 2018-05-10 DIAGNOSIS — I779 Disorder of arteries and arterioles, unspecified: Secondary | ICD-10-CM

## 2018-05-11 ENCOUNTER — Other Ambulatory Visit: Payer: Self-pay | Admitting: Cardiology

## 2018-06-08 ENCOUNTER — Ambulatory Visit: Payer: Medicare Other | Admitting: Vascular Surgery

## 2018-06-08 ENCOUNTER — Inpatient Hospital Stay (HOSPITAL_COMMUNITY): Admission: RE | Admit: 2018-06-08 | Payer: Medicare Other | Source: Ambulatory Visit

## 2018-06-08 ENCOUNTER — Encounter: Payer: Self-pay | Admitting: Family

## 2018-06-08 ENCOUNTER — Encounter (HOSPITAL_COMMUNITY): Payer: Medicare Other

## 2018-07-06 ENCOUNTER — Other Ambulatory Visit: Payer: Self-pay

## 2018-07-06 ENCOUNTER — Ambulatory Visit (INDEPENDENT_AMBULATORY_CARE_PROVIDER_SITE_OTHER)
Admission: RE | Admit: 2018-07-06 | Discharge: 2018-07-06 | Disposition: A | Discharge status: Home / Self Care | Payer: Medicare Other | Source: Ambulatory Visit | Attending: Vascular Surgery | Admitting: Vascular Surgery

## 2018-07-06 ENCOUNTER — Ambulatory Visit (HOSPITAL_COMMUNITY): Admission: RE | Admit: 2018-07-06 | Payer: Medicare Other | Source: Ambulatory Visit

## 2018-07-06 ENCOUNTER — Encounter: Payer: Self-pay | Admitting: Vascular Surgery

## 2018-07-06 ENCOUNTER — Other Ambulatory Visit (HOSPITAL_COMMUNITY): Payer: Medicare Other

## 2018-07-06 ENCOUNTER — Ambulatory Visit (HOSPITAL_COMMUNITY)
Admission: RE | Admit: 2018-07-06 | Discharge: 2018-07-06 | Disposition: A | Discharge status: Home / Self Care | Payer: Medicare Other | Source: Ambulatory Visit | Attending: Vascular Surgery | Admitting: Vascular Surgery

## 2018-07-06 ENCOUNTER — Ambulatory Visit: Payer: Medicare Other | Admitting: Vascular Surgery

## 2018-07-06 ENCOUNTER — Ambulatory Visit (INDEPENDENT_AMBULATORY_CARE_PROVIDER_SITE_OTHER): Payer: Medicare Other | Admitting: Vascular Surgery

## 2018-07-06 VITALS — BP 132/81 | HR 70 | Temp 97.5°F | Resp 20 | Ht 70.0 in | Wt 201.8 lb

## 2018-07-06 DIAGNOSIS — S91301D Unspecified open wound, right foot, subsequent encounter: Secondary | ICD-10-CM

## 2018-07-06 DIAGNOSIS — I779 Disorder of arteries and arterioles, unspecified: Secondary | ICD-10-CM

## 2018-07-06 NOTE — Progress Notes (Signed)
Patient ID: Eugene Parker, male   DOB: June 03, 1960, 58 y.o.   MRN: [id number]  Reason for Consult: Follow-up   Referred by Samuel Jester, DO  Subjective:     HPI:  Eugene Parker is a 58 y.o. male underwent right femoral to popliteal artery bypass with vein last July.  Still has a wound overall doing well incisions are all healed.  He is doing local wound care with hydrogel.  Still helping with his father.  Taking Eliquis and aspirin.  Past Medical History:  Diagnosis Date  . Anxiety   . CAD in native artery    a. STEMI 06/2014 s/p DES to Cx.  . Cellulitis and abscess of foot 03/22/2016  . Collagen vascular disease (HCC)   . Current smoker   . Dental caries    periodontitis  . DJD (degenerative joint disease), lumbosacral   . Essential hypertension   . Heart attack (HCC)   . Hiccups   . Hyperlipemia   . PAD (peripheral artery disease) (HCC)   . Stroke (cerebrum) [facility])    Family History  Problem Relation Age of Onset  . Heart disease Mother   . COPD Mother   . Heart disease Father    Past Surgical History:  Procedure Laterality Date  . ABDOMINAL AORTOGRAM W/LOWER EXTREMITY N/A 12/21/2016   Procedure: ABDOMINAL AORTOGRAM W/LOWER EXTREMITY;  Surgeon: Maeola Harman, MD;  [facility] CV LAB;  Service: Cardiovascular;  Laterality: N/A;  . ABDOMINAL AORTOGRAM W/LOWER EXTREMITY N/A 11/01/2017   Procedure: ABDOMINAL AORTOGRAM W/LOWER EXTREMITY;  Surgeon: Maeola Harman, MD;  Location: [facility] INVASIVE CV LAB;  Service: Cardiovascular;  Laterality: N/A;  . ENDARTERECTOMY FEMORAL Right 11/02/2017   Procedure: ENDARTERECTOMY RIGHT FEMORAL ARTERY;  Surgeon: Maeola Harman, MD;  [facility] OR;  Service: Vascular;  Laterality: Right;  . FEMORAL-POPLITEAL BYPASS GRAFT Right 11/02/2017   Procedure: BYPASS GRAFT RIGHT FEMORAL TO BELOW KNEE POPLITEAL ARTERY USING RIGHT GREAT SAPHENOUS VEIN ;  Surgeon: Maeola Harman, MD;   Location[facility] OR;  Service: Vascular;  Laterality: Right;  . INTRADISCAL INJECTION  Oct 2012   L5-S1  . LEFT HEART CATHETERIZATION WITH CORONARY ANGIOGRAM N/A 07/04/2014   Procedure: LEFT HEART CATHETERIZATION WITH CORONARY ANGIOGRAM;  Surgeon: Runell Gess, MD;  Location: [facility] CATH LAB;  Service: Cardiovascular;  Laterality: N/A;  . LEG SURGERY    . PERIPHERAL VASCULAR CATHETERIZATION N/A 02/03/2016   Procedure: Abdominal Aortogram;  Surgeon: Maeola Harman, MD;  Location: [facility] INVASIVE CV LAB;  Service: Cardiovascular;  Laterality: N/A;  . PERIPHERAL VASCULAR CATHETERIZATION N/A 02/03/2016   Procedure: Lower Extremity Angiography;  Surgeon: Maeola Harman, MD;  Location: [facility] INVASIVE CV LAB;  Service: Cardiovascular;  Laterality: N/A;  . PERIPHERAL VASCULAR CATHETERIZATION Right 02/03/2016   Procedure: Peripheral Vascular Intervention;  Surgeon: Maeola Harman, MD;  Location: [facility] INVASIVE CV LAB;  Service: Cardiovascular;  Laterality: Right;  SFA  . TOOTH EXTRACTION N/A 02/02/2018   Procedure: DENTAL RESTORATION/EXTRACTIONS;  Surgeon: Ocie Doyne, DDS;  Location: [facility] OR;  Service: Oral Surgery;  Laterality: N/A;  . VEIN HARVEST Right 11/02/2017   Procedure: VEIN HARVEST RIGHT GREAT SAPHENOUS VEIN;  Surgeon: Maeola Harman, MD;  Location: [facility] OR;  Service: Vascular;  Laterality: Right;    Short Social History:  Social History   Tobacco Use  . Smoking status: Current Every Day Smoker    Packs/day: 1.00    Years: 40.00    Pack years: 40.00  Types: Cigarettes    Start date: 04/22/1970  . Smokeless tobacco: Never Used  . Tobacco comment: 1 pack per 2 days  Substance Use Topics  . Alcohol use: Yes    Alcohol/week: 0.0 standard drinks    Comment: occasisional beer    Allergies  Allergen Reactions  . Codeine Nausea Only    Current Outpatient Medications  Medication Sig Dispense Refill  . ALPRAZolam (XANAX) 0.5 MG tablet Take 1 mg by mouth 2  (two) times daily.     Marland Kitchen. apixaban (ELIQUIS) 5 MG TABS tablet Take 1 tablet (5 mg total) by mouth 2 (two) times daily. 60 tablet 0  . aspirin 81 MG EC tablet Take 1 tablet (81 mg total) by mouth daily.    Marland Kitchen. lisinopril (PRINIVIL,ZESTRIL) 10 MG tablet Take 10 mg by mouth daily.  11  . metoprolol tartrate (LOPRESSOR) 25 MG tablet TAKE ONE TABLET BY MOUTH TWICE DAILY. 180 tablet 3  . nitroGLYCERIN (NITROSTAT) 0.4 MG SL tablet DISSOLVE ONE TABLET UNDER TONGUE EVERY 5 MINUTES UP TO 3 DOSES AS NEEDED FOR CHEST PAIN 25 tablet 2  . pantoprazole (PROTONIX) 40 MG tablet TAKE ONE TABLET BY MOUTH DAILY AT 6 AM 30 tablet 0  . sodium chloride irrigation 0.9 % irrigation Irrigate with 200 mLs as directed daily. IRRIGATE AFFECTED AREA OF FOOT (SPIDER BITE) ONCE DAILY    . VENTOLIN HFA 108 (90 Base) MCG/ACT inhaler INHALE BY MOUTH AS DIRECTED (Patient taking differently: Inhale 2 puffs into the lungs every 6 (six) hours as needed for wheezing or shortness of breath. ) 18 g 1  . Wound Dressings (WOUND GEL EX) Apply 1 application topically daily. HEALING GEL APPLIED ONCE DAILY AFTER IRRIGATING WOUND (SPIDER BITE)     No current facility-administered medications for this visit.     Review of Systems  Constitutional:  Constitutional negative. HENT: HENT negative.  Eyes: Eyes negative.  Respiratory: Respiratory negative.  Cardiovascular: Cardiovascular negative.  GI: Gastrointestinal negative.  Musculoskeletal: Musculoskeletal negative.  Skin: Positive for wound.  Neurological: Neurological negative. Hematologic: Hematologic/lymphatic negative.  Psychiatric: Psychiatric negative.        Objective:  Objective   Vitals:   07/06/18 1029  BP: 132/81  Pulse: 70  Resp: 20  Temp: (!) 97.5 F (36.4 C)  SpO2: 99%  Weight: 201 lb 12.8 oz (91.5 kg)  Height: 5\' 10"  (1.778 m)   Body mass index is 28.96 kg/m.  Physical Exam Constitutional:      Appearance: Normal appearance.  Eyes:     Pupils: Pupils are  equal, round, and reactive to light.  Neck:     Musculoskeletal: Normal range of motion.  Cardiovascular:     Rate and Rhythm: Normal rate.     Pulses:          Femoral pulses are 2+ on the right side and 2+ on the left side.    Comments: Strong right dorsalis pedis signal Pulmonary:     Effort: Pulmonary effort is normal.  Abdominal:     General: Abdomen is flat.  Musculoskeletal: Normal range of motion.        General: No swelling.  Skin:    Capillary Refill: Capillary refill takes less than 2 seconds.  Neurological:     General: No focal deficit present.     Mental Status: He is alert.  Psychiatric:        Mood and Affect: Mood normal.        Behavior: Behavior normal.  Thought Content: Thought content normal.        Judgment: Judgment normal.    DATA I have independently interpreted his right lower extremity duplex to have 30 to 49% stenosis at the takeoff with biphasic waveforms throughout and widely patent.  ABI on the right 1.15 and left 0.86     Assessment/Plan:     58 year old male status post right femoropopliteal bypass for wound.  I cannot get the picture downloaded today but in relation to his image from 8 of 2020 does appear to be healing significantly although slowly.  He will continue his medical treatment I will see him in 6 months with repeat studies.     Maeola Harman MD [facility]

## 2018-11-09 ENCOUNTER — Other Ambulatory Visit: Payer: Self-pay

## 2018-11-09 DIAGNOSIS — I779 Disorder of arteries and arterioles, unspecified: Secondary | ICD-10-CM

## 2018-11-16 ENCOUNTER — Encounter (HOSPITAL_COMMUNITY): Payer: Medicare Other

## 2018-11-16 ENCOUNTER — Encounter: Payer: Self-pay | Admitting: Family

## 2018-11-16 ENCOUNTER — Ambulatory Visit: Payer: Medicare Other | Admitting: Vascular Surgery

## 2019-01-10 IMAGING — NM NM MYOCAR MULTI W/SPECT W/WALL MOTION & EF
2 series · 12 of 12 positions shown · non-contrast
Comparison: none

[Series 1: rest · 6.51mm/px · 6 of 64 frames shown]
[frame 6/64]
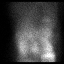
[frame 16/64]
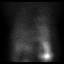
[frame 27/64]
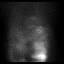
[frame 38/64]
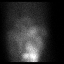
[frame 48/64]
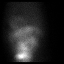
[frame 59/64]
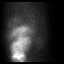

[Series 2: stress gated - gated · 6.51mm/px · 6 of 483 frames shown]
[frame 41/483  full-range]
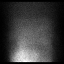
[frame 121/483  full-range]
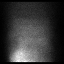
[frame 202/483  full-range]
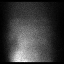
[frame 282/483  full-range]
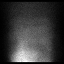
[frame 363/483  full-range]
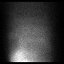
[frame 443/483  full-range]
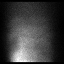

[12 of 12 positions shown; findings below may reference images not displayed]

Canned report from images found in remote index.

Refer to host system for actual result text.

## 2019-08-26 ENCOUNTER — Other Ambulatory Visit: Payer: Self-pay

## 2019-08-26 ENCOUNTER — Inpatient Hospital Stay (HOSPITAL_COMMUNITY)
Admission: EM | Admit: 2019-08-26 | Discharge: 2019-09-01 | DRG: 247 | Disposition: A | Discharge status: Home / Self Care | Payer: Medicare Other | Attending: Internal Medicine | Admitting: Internal Medicine

## 2019-08-26 ENCOUNTER — Encounter (HOSPITAL_COMMUNITY): Payer: Self-pay | Admitting: *Deleted

## 2019-08-26 ENCOUNTER — Emergency Department (HOSPITAL_COMMUNITY): Payer: Medicare Other

## 2019-08-26 DIAGNOSIS — Z8673 Personal history of transient ischemic attack (TIA), and cerebral infarction without residual deficits: Secondary | ICD-10-CM

## 2019-08-26 DIAGNOSIS — Z7982 Long term (current) use of aspirin: Secondary | ICD-10-CM

## 2019-08-26 DIAGNOSIS — I255 Ischemic cardiomyopathy: Secondary | ICD-10-CM | POA: Diagnosis present

## 2019-08-26 DIAGNOSIS — R0609 Other forms of dyspnea: Secondary | ICD-10-CM

## 2019-08-26 DIAGNOSIS — Z885 Allergy status to narcotic agent status: Secondary | ICD-10-CM

## 2019-08-26 DIAGNOSIS — I252 Old myocardial infarction: Secondary | ICD-10-CM

## 2019-08-26 DIAGNOSIS — R0602 Shortness of breath: Secondary | ICD-10-CM | POA: Diagnosis not present

## 2019-08-26 DIAGNOSIS — I509 Heart failure, unspecified: Secondary | ICD-10-CM

## 2019-08-26 DIAGNOSIS — Z7901 Long term (current) use of anticoagulants: Secondary | ICD-10-CM

## 2019-08-26 DIAGNOSIS — I739 Peripheral vascular disease, unspecified: Secondary | ICD-10-CM | POA: Diagnosis present

## 2019-08-26 DIAGNOSIS — I272 Pulmonary hypertension, unspecified: Secondary | ICD-10-CM | POA: Diagnosis present

## 2019-08-26 DIAGNOSIS — Z20822 Contact with and (suspected) exposure to covid-19: Secondary | ICD-10-CM | POA: Diagnosis present

## 2019-08-26 DIAGNOSIS — M47817 Spondylosis without myelopathy or radiculopathy, lumbosacral region: Secondary | ICD-10-CM | POA: Diagnosis present

## 2019-08-26 DIAGNOSIS — I5043 Acute on chronic combined systolic (congestive) and diastolic (congestive) heart failure: Secondary | ICD-10-CM | POA: Diagnosis present

## 2019-08-26 DIAGNOSIS — M359 Systemic involvement of connective tissue, unspecified: Secondary | ICD-10-CM | POA: Diagnosis present

## 2019-08-26 DIAGNOSIS — I110 Hypertensive heart disease with heart failure: Principal | ICD-10-CM | POA: Diagnosis present

## 2019-08-26 DIAGNOSIS — E876 Hypokalemia: Secondary | ICD-10-CM | POA: Diagnosis not present

## 2019-08-26 DIAGNOSIS — L97318 Non-pressure chronic ulcer of right ankle with other specified severity: Secondary | ICD-10-CM | POA: Diagnosis present

## 2019-08-26 DIAGNOSIS — I4581 Long QT syndrome: Secondary | ICD-10-CM | POA: Diagnosis present

## 2019-08-26 DIAGNOSIS — Z9114 Patient's other noncompliance with medication regimen: Secondary | ICD-10-CM

## 2019-08-26 DIAGNOSIS — I480 Paroxysmal atrial fibrillation: Secondary | ICD-10-CM | POA: Diagnosis present

## 2019-08-26 DIAGNOSIS — I10 Essential (primary) hypertension: Secondary | ICD-10-CM | POA: Diagnosis present

## 2019-08-26 DIAGNOSIS — I2510 Atherosclerotic heart disease of native coronary artery without angina pectoris: Secondary | ICD-10-CM | POA: Diagnosis present

## 2019-08-26 DIAGNOSIS — I5021 Acute systolic (congestive) heart failure: Secondary | ICD-10-CM

## 2019-08-26 DIAGNOSIS — Z95820 Peripheral vascular angioplasty status with implants and grafts: Secondary | ICD-10-CM

## 2019-08-26 DIAGNOSIS — J449 Chronic obstructive pulmonary disease, unspecified: Secondary | ICD-10-CM | POA: Diagnosis present

## 2019-08-26 DIAGNOSIS — Z79899 Other long term (current) drug therapy: Secondary | ICD-10-CM

## 2019-08-26 DIAGNOSIS — F17200 Nicotine dependence, unspecified, uncomplicated: Secondary | ICD-10-CM | POA: Diagnosis present

## 2019-08-26 DIAGNOSIS — F17210 Nicotine dependence, cigarettes, uncomplicated: Secondary | ICD-10-CM | POA: Diagnosis present

## 2019-08-26 DIAGNOSIS — Z825 Family history of asthma and other chronic lower respiratory diseases: Secondary | ICD-10-CM

## 2019-08-26 DIAGNOSIS — N5089 Other specified disorders of the male genital organs: Secondary | ICD-10-CM | POA: Diagnosis present

## 2019-08-26 DIAGNOSIS — F419 Anxiety disorder, unspecified: Secondary | ICD-10-CM | POA: Diagnosis present

## 2019-08-26 DIAGNOSIS — Z955 Presence of coronary angioplasty implant and graft: Secondary | ICD-10-CM

## 2019-08-26 DIAGNOSIS — Z8249 Family history of ischemic heart disease and other diseases of the circulatory system: Secondary | ICD-10-CM

## 2019-08-26 DIAGNOSIS — R609 Edema, unspecified: Secondary | ICD-10-CM

## 2019-08-26 DIAGNOSIS — E785 Hyperlipidemia, unspecified: Secondary | ICD-10-CM | POA: Diagnosis present

## 2019-08-26 DIAGNOSIS — I447 Left bundle-branch block, unspecified: Secondary | ICD-10-CM

## 2019-08-26 DIAGNOSIS — Z9119 Patient's noncompliance with other medical treatment and regimen: Secondary | ICD-10-CM

## 2019-08-26 LAB — CBC WITH DIFFERENTIAL/PLATELET
Abs Immature Granulocytes: 0.03 10*3/uL (ref 0.00–0.07)
Basophils Absolute: 0.1 10*3/uL (ref 0.0–0.1)
Basophils Relative: 1 %
Eosinophils Absolute: 0.1 10*3/uL (ref 0.0–0.5)
Eosinophils Relative: 2 %
HCT: 40.7 % (ref 39.0–52.0)
Hemoglobin: 13 g/dL (ref 13.0–17.0)
Immature Granulocytes: 0 %
Lymphocytes Relative: 21 %
Lymphs Abs: 1.7 10*3/uL (ref 0.7–4.0)
MCH: 29.3 pg (ref 26.0–34.0)
MCHC: 31.9 g/dL (ref 30.0–36.0)
MCV: 91.7 fL (ref 80.0–100.0)
Monocytes Absolute: 0.9 10*3/uL (ref 0.1–1.0)
Monocytes Relative: 12 %
Neutro Abs: 5 10*3/uL (ref 1.7–7.7)
Neutrophils Relative %: 64 %
Platelets: 192 10*3/uL (ref 150–400)
RBC: 4.44 MIL/uL (ref 4.22–5.81)
RDW: 16.7 % — ABNORMAL HIGH (ref 11.5–15.5)
WBC: 7.9 10*3/uL (ref 4.0–10.5)
nRBC: 0 % (ref 0.0–0.2)

## 2019-08-26 LAB — TROPONIN I (HIGH SENSITIVITY)
Troponin I (High Sensitivity): 45 ng/L — ABNORMAL HIGH (ref <18)
Troponin I (High Sensitivity): 43 ng/L — ABNORMAL HIGH (ref <18)

## 2019-08-26 LAB — BASIC METABOLIC PANEL
Anion gap: 9 (ref 5–15)
BUN: 15 mg/dL (ref 8–23)
CO2: 20 mmol/L — ABNORMAL LOW (ref 22–32)
Calcium: 8.7 mg/dL — ABNORMAL LOW (ref 8.9–10.3)
Chloride: 106 mmol/L (ref 98–111)
Creatinine, Ser: 1.08 mg/dL (ref 0.61–1.24)
GFR calc Af Amer: >60 mL/min (ref >60)
GFR calc non Af Amer: >60 mL/min (ref >60)
Glucose, Bld: 121 mg/dL — ABNORMAL HIGH (ref 70–99)
Potassium: 3.9 mmol/L (ref 3.5–5.1)
Sodium: 135 mmol/L (ref 135–145)

## 2019-08-26 LAB — BRAIN NATRIURETIC PEPTIDE: B Natriuretic Peptide: 1615 pg/mL — ABNORMAL HIGH (ref 0.0–100.0)

## 2019-08-26 LAB — SARS CORONAVIRUS 2 BY RT PCR (HOSPITAL ORDER, PERFORMED IN ~~LOC~~ HOSPITAL LAB): SARS Coronavirus 2: NEGATIVE

## 2019-08-26 MED ORDER — ONDANSETRON HCL 4 MG/2ML IJ SOLN: 4.0000 mg | Freq: Four times a day (QID) | INTRAMUSCULAR | Status: DC | PRN

## 2019-08-26 MED ORDER — SODIUM CHLORIDE 0.9 % IV SOLN: 250.0000 mL | INTRAVENOUS | Status: DC | PRN

## 2019-08-26 MED ORDER — LISINOPRIL 10 MG PO TABS
10.0000 mg | ORAL_TABLET | Freq: Every day | ORAL | Status: DC
  Administered 2019-08-27 – 2019-09-01 (×6): 10 mg via ORAL
  Filled 2019-08-26 (×6): qty 1

## 2019-08-26 MED ORDER — ENOXAPARIN SODIUM 40 MG/0.4ML ~~LOC~~ SOLN
40.0000 mg | SUBCUTANEOUS | Status: DC
  Administered 2019-08-26: 40 mg via SUBCUTANEOUS
  Filled 2019-08-26: qty 0.4

## 2019-08-26 MED ORDER — ACETAMINOPHEN 325 MG PO TABS
650.0000 mg | ORAL_TABLET | ORAL | Status: DC | PRN
  Administered 2019-08-26 – 2019-08-27 (×3): 650 mg via ORAL
  Filled 2019-08-26 (×3): qty 2

## 2019-08-26 MED ORDER — FUROSEMIDE 10 MG/ML IJ SOLN
40.0000 mg | Freq: Once | INTRAMUSCULAR | Status: AC
  Administered 2019-08-26: 40 mg via INTRAVENOUS
  Filled 2019-08-26: qty 4

## 2019-08-26 MED ORDER — SODIUM CHLORIDE 0.9% FLUSH
3.0000 mL | Freq: Two times a day (BID) | INTRAVENOUS | Status: DC
  Administered 2019-08-27 – 2019-08-30 (×7): 3 mL via INTRAVENOUS

## 2019-08-26 MED ORDER — MORPHINE SULFATE (PF) 2 MG/ML IV SOLN
2.0000 mg | Freq: Once | INTRAVENOUS | Status: AC
  Administered 2019-08-26: 2 mg via INTRAVENOUS
  Filled 2019-08-26: qty 1

## 2019-08-26 MED ORDER — FUROSEMIDE 10 MG/ML IJ SOLN
40.0000 mg | Freq: Every day | INTRAMUSCULAR | Status: DC
  Administered 2019-08-27: 40 mg via INTRAVENOUS
  Filled 2019-08-26: qty 4

## 2019-08-26 MED ORDER — SODIUM CHLORIDE 0.9% FLUSH: 3.0000 mL | INTRAVENOUS | Status: DC | PRN

## 2019-08-26 NOTE — ED Notes (Signed)
Report to Tiffany, RN 

## 2019-08-26 NOTE — ED Provider Notes (Signed)
[facility] EMERGENCY DEPARTMENT Provider Note   CSN: [id number] Arrival date & time: 08/26/19  1748     History Chief Complaint  Patient presents with  . Shortness of Breath  . Leg Swelling    Eugene Parker is a 59 y.o. male.  He is a history of coronary disease with an IMI and a stent.  He is also had peripheral vascular disease with bypass.  Complaining of 3 to 4 days of increased in leg edema abdominal distention pain in his legs shortness of breath on exertion.  Some chest tightness on and off.  Says that actually is fairly common for him.  Does not feel like his cardiac event.  No fevers or chills.  No vomiting diarrhea or urinary symptoms.  Has a chronic wound on his right ankle that he says is improving.  On anticoagulation.  The history is provided by the patient.  Shortness of Breath Severity:  Moderate Onset quality:  Gradual Duration:  4 days Timing:  Intermittent Progression:  Worsening Chronicity:  New Context: activity   Relieved by:  Nothing Worsened by:  Activity Ineffective treatments:  Rest Associated symptoms: chest pain and cough   Associated symptoms: no abdominal pain, no diaphoresis, no fever, no headaches, no hemoptysis, no rash, no sore throat, no syncope and no vomiting   Risk factors: tobacco use        Past Medical History:  Diagnosis Date  . Anxiety   . CAD in native artery    a. STEMI 06/2014 s/p DES to Cx.  . Cellulitis and abscess of foot 03/22/2016  . Collagen vascular disease (HCC)   . Current smoker   . Dental caries    periodontitis  . DJD (degenerative joint disease), lumbosacral   . Essential hypertension   . Heart attack (HCC)   . Hiccups   . Hyperlipemia   . PAD (peripheral artery disease) (HCC)   . Stroke (cerebrum) [facility])     Patient Active Problem List   Diagnosis Date Noted  . PAD (peripheral artery disease) (HCC) 11/01/2017  . Cellulitis and abscess of foot 03/22/2016  . PVD (peripheral vascular disease)  (HCC) 03/22/2016  . Hyperlipidemia 07/06/2014  . Elevated LFTs 07/06/2014  . ST elevation myocardial infarction (STEMI) of inferior wall (HCC) 07/04/2014  . Current smoker 07/04/2014  . Essential hypertension 07/04/2014    Past Surgical History:  Procedure Laterality Date  . ABDOMINAL AORTOGRAM W/LOWER EXTREMITY N/A 12/21/2016   Procedure: ABDOMINAL AORTOGRAM W/LOWER EXTREMITY;  Surgeon: Maeola Harman, MD;  Location: [facility] INVASIVE CV LAB;  Service: Cardiovascular;  Laterality: N/A;  . ABDOMINAL AORTOGRAM W/LOWER EXTREMITY N/A 11/01/2017   Procedure: ABDOMINAL AORTOGRAM W/LOWER EXTREMITY;  Surgeon: Maeola Harman, MD;  Location: [facility] INVASIVE CV LAB;  Service: Cardiovascular;  Laterality: N/A;  . ENDARTERECTOMY FEMORAL Right 11/02/2017   Procedure: ENDARTERECTOMY RIGHT FEMORAL ARTERY;  Surgeon: Maeola Harman, MD;  Location: [facility] OR;  Service: Vascular;  Laterality: Right;  . FEMORAL-POPLITEAL BYPASS GRAFT Right 11/02/2017   Procedure: BYPASS GRAFT RIGHT FEMORAL TO BELOW KNEE POPLITEAL ARTERY USING RIGHT GREAT SAPHENOUS VEIN ;  Surgeon: Maeola Harman, MD;  Location: [facility] OR;  Service: Vascular;  Laterality: Right;  . INTRADISCAL INJECTION  Oct 2012   L5-S1  . LEFT HEART CATHETERIZATION WITH CORONARY ANGIOGRAM N/A 07/04/2014   Procedure: LEFT HEART CATHETERIZATION WITH CORONARY ANGIOGRAM;  Surgeon: Runell Gess, MD;  Location: [facility] CATH LAB;  Service: Cardiovascular;  Laterality: N/A;  . LEG SURGERY    .  PERIPHERAL VASCULAR CATHETERIZATION N/A 02/03/2016   Procedure: Abdominal Aortogram;  Surgeon: Waynetta Sandy, MD;  Location: [place] CV LAB;  Service: Cardiovascular;  Laterality: N/A;  . PERIPHERAL VASCULAR CATHETERIZATION N/A 02/03/2016   Procedure: Lower Extremity Angiography;  Surgeon: Waynetta Sandy, MD;  Location: [place] CV LAB;  Service: Cardiovascular;  Laterality: N/A;  . PERIPHERAL VASCULAR CATHETERIZATION Right  02/03/2016   Procedure: Peripheral Vascular Intervention;  Surgeon: Waynetta Sandy, MD;  Location: [place] CV LAB;  Service: Cardiovascular;  Laterality: Right;  SFA  . TOOTH EXTRACTION N/A 02/02/2018   Procedure: DENTAL RESTORATION/EXTRACTIONS;  Surgeon: Diona Browner, DDS;  Location: [place];  Service: Oral Surgery;  Laterality: N/A;  . VEIN HARVEST Right 11/02/2017   Procedure: VEIN HARVEST RIGHT GREAT SAPHENOUS VEIN;  Surgeon: Waynetta Sandy, MD;  Location: [place];  Service: Vascular;  Laterality: Right;       Family History  Problem Relation Age of Onset  . Heart disease Mother   . COPD Mother   . Heart disease Father     Social History   Tobacco Use  . Smoking status: Current Every Day Smoker    Packs/day: 1.00    Years: 40.00    Pack years: 40.00    Types: Cigarettes    Start date: 04/22/1970  . Smokeless tobacco: Never Used  . Tobacco comment: 1 pack per 2 days  Substance Use Topics  . Alcohol use: Yes    Alcohol/week: 0.0 standard drinks    Comment: occasisional beer  . Drug use: Not Currently    Types: Marijuana    Home Medications Prior to Admission medications   Medication Sig Start Date End Date Taking? Authorizing Provider  ALPRAZolam Duanne Moron) 0.5 MG tablet Take 1 mg by mouth 2 (two) times daily.     [provider]  apixaban (ELIQUIS) 5 MG TABS tablet Take 1 tablet (5 mg total) by mouth 2 (two) times daily. 09/27/17   Arnoldo Lenis, MD  aspirin 81 MG EC tablet Take 1 tablet (81 mg total) by mouth daily. 11/04/17   Conrad Fall Branch, MD  lisinopril (PRINIVIL,ZESTRIL) 10 MG tablet Take 10 mg by mouth daily. 09/01/17   [provider]  metoprolol tartrate (LOPRESSOR) 25 MG tablet TAKE ONE TABLET BY MOUTH TWICE DAILY. 02/07/18   Arnoldo Lenis, MD  nitroGLYCERIN (NITROSTAT) 0.4 MG SL tablet DISSOLVE ONE TABLET UNDER TONGUE EVERY 5 MINUTES UP TO 3 DOSES AS NEEDED FOR CHEST PAIN 02/07/18   Arnoldo Lenis, MD  pantoprazole  (PROTONIX) 40 MG tablet TAKE ONE TABLET BY MOUTH DAILY AT 6 AM 05/11/18   Arnoldo Lenis, MD  sodium chloride irrigation 0.9 % irrigation Irrigate with 200 mLs as directed daily. IRRIGATE AFFECTED AREA OF FOOT (SPIDER BITE) ONCE DAILY    [provider]  VENTOLIN HFA 108 (90 Base) MCG/ACT inhaler INHALE BY MOUTH AS DIRECTED Patient taking differently: Inhale 2 puffs into the lungs every 6 (six) hours as needed for wheezing or shortness of breath.  07/07/17   Arnoldo Lenis, MD  Wound Dressings (WOUND GEL EX) Apply 1 application topically daily. HEALING GEL APPLIED ONCE DAILY AFTER IRRIGATING WOUND (SPIDER BITE)    [provider]    Allergies    Codeine  Review of Systems   Review of Systems  Constitutional: Negative for diaphoresis and fever.  HENT: Negative for sore throat.   Eyes: Negative for visual disturbance.  Respiratory: Positive for cough and shortness of  breath. Negative for hemoptysis.   Cardiovascular: Positive for chest pain and leg swelling. Negative for syncope.  Gastrointestinal: Positive for abdominal distention. Negative for abdominal pain, nausea and vomiting.  Genitourinary: Negative for dysuria.  Musculoskeletal: Positive for gait problem.  Skin: Negative for rash.  Neurological: Negative for headaches.    Physical Exam Updated Vital Signs BP 120/87   Pulse 93   Temp 98.1 F (36.7 C)   Resp 14   Ht 5\' 11"  (1.803 m)   Wt 98 kg   SpO2 97%   BMI 30.13 kg/m   Physical Exam Vitals and nursing note reviewed.  Constitutional:      Appearance: He is well-developed.  HENT:     Head: Normocephalic and atraumatic.  Eyes:     Conjunctiva/sclera: Conjunctivae normal.  Cardiovascular:     Rate and Rhythm: Normal rate and regular rhythm.     Heart sounds: No murmur.  Pulmonary:     Effort: Pulmonary effort is normal. Tachypnea present. No respiratory distress.     Breath sounds: Normal breath sounds.  Abdominal:     Palpations:  Abdomen is soft.     Tenderness: There is no abdominal tenderness.  Musculoskeletal:     Cervical back: Neck supple.     Right lower leg: Tenderness present. Edema present.     Left lower leg: Tenderness present. Edema present.  Skin:    General: Skin is warm and dry.     Comments: Approximately 4 cm ulcer right ankle with clean edges not looking particularly infected  Neurological:     General: No focal deficit present.     Mental Status: He is alert and oriented to person, place, and time.     ED Results / Procedures / Treatments   Labs (all labs ordered are listed, but only abnormal results are displayed) Labs Reviewed  BRAIN NATRIURETIC PEPTIDE - Abnormal; Notable for the following components:      Result Value   B Natriuretic Peptide 1,615.0 (*)    All other components within normal limits  CBC WITH DIFFERENTIAL/PLATELET - Abnormal; Notable for the following components:   RDW 16.7 (*)    All other components within normal limits  BASIC METABOLIC PANEL - Abnormal; Notable for the following components:   CO2 20 (*)    Glucose, Bld 121 (*)    Calcium 8.7 (*)    All other components within normal limits  TROPONIN I (HIGH SENSITIVITY) - Abnormal; Notable for the following components:   Troponin I (High Sensitivity) 45 (*)    All other components within normal limits  TROPONIN I (HIGH SENSITIVITY) - Abnormal; Notable for the following components:   Troponin I (High Sensitivity) 43 (*)    All other components within normal limits  SARS CORONAVIRUS 2 BY RT PCR (HOSPITAL ORDER, PERFORMED IN [place] [facility])  HIV ANTIBODY (ROUTINE TESTING W REFLEX)  BASIC METABOLIC PANEL    EKG EKG Interpretation  Date/Time:  Monday Aug 26 2019 18:25:28 EDT Ventricular Rate:  97 PR Interval:    QRS Duration: 151 QT Interval:  403 QTC Calculation: 512 R Axis:   109 Text Interpretation: Sinus tachycardia Atrial premature complexes LVH with secondary repolarization abnormality  Anterior infarct, old Prolonged QT interval new from prior 7/19 Confirmed by 8/19 [phone]) on 08/26/2019 6:48:49 PM   Radiology DG Chest Portable 1 View  Result Date: 08/26/2019 CLINICAL DATA:  Shortness of breath and lower extremity swelling EXAM: PORTABLE CHEST 1 VIEW COMPARISON:  November 21, 2018 FINDINGS: There is mild cardiomegaly. Prominence of the central pulmonary vasculature is seen. There is a probable trace right pleural effusion. Aortic knob calcifications are seen. No large airspace consolidation is noted. No acute osseous abnormality. IMPRESSION: Stable cardiomegaly and pulmonary vascular congestion. Electronically Signed   By: Jonna ClarkBindu  Avutu M.D.   On: 08/26/2019 19:12    Procedures .Critical Care Performed by: Terrilee FilesButler, Jolissa Kapral C, MD Authorized by: Terrilee FilesButler, Alazae Crymes C, MD   Critical care provider statement:    Critical care time (minutes):  45   Critical care time was exclusive of:  Separately billable procedures and treating other patients   Critical care was necessary to treat or prevent imminent or life-threatening deterioration of the following conditions:  Cardiac failure and respiratory failure   Critical care was time spent personally by me on the following activities:  Discussions with consultants, evaluation of patient's response to treatment, examination of patient, ordering and performing treatments and interventions, ordering and review of laboratory studies, ordering and review of radiographic studies, pulse oximetry, re-evaluation of patient's condition, obtaining history from patient or surrogate, review of old charts and development of treatment plan with patient or surrogate   I assumed direction of critical care for this patient from another provider in my specialty: no     (including critical care time)  Medications Ordered in ED Medications  sodium chloride flush (NS) 0.9 % injection 3 mL (3 mLs Intravenous Not Given 08/26/19 2212)  sodium chloride flush  (NS) 0.9 % injection 3 mL (has no administration in time range)  0.9 %  sodium chloride infusion (has no administration in time range)  acetaminophen (TYLENOL) tablet 650 mg (650 mg Oral Given 08/26/19 2308)  ondansetron (ZOFRAN) injection 4 mg (has no administration in time range)  enoxaparin (LOVENOX) injection 40 mg (40 mg Subcutaneous Given 08/26/19 2309)  lisinopril (ZESTRIL) tablet 10 mg (has no administration in time range)  furosemide (LASIX) injection 40 mg (has no administration in time range)  furosemide (LASIX) injection 40 mg (40 mg Intravenous Given 08/26/19 1934)  morphine 2 MG/ML injection 2 mg (2 mg Intravenous Given 08/26/19 2135)    ED Course  I have reviewed the triage vital signs and the nursing notes.  Pertinent labs & imaging results that were available during my care of the patient were reviewed by me and considered in my medical decision making (see chart for details).  Clinical Course as of Aug 25 2320  Mon Aug 26, 2019  1903 EKG with a new left bundle branch block and some ST elevations anteriorly.  Discussed with Dr. [place] [facility] Dr[facility].  He feels that this would not be an acute STEMI with this presentation.  He is recommending getting enzymes and further working up the patient for CHF.  Call back if significantly elevated troponin.   [MB]    Clinical Course User Index [MB] Terrilee FilesButler, Isidora Laham C, MD   MDM Rules/Calculators/A&P                     This patient complains of leg swelling dyspnea on exertion chest tightness; this involves an extensive number of treatment Options and is a complaint that carries with it a high risk of complications and Morbidity. The differential includes ACS, CHF, pneumonia, PE, DVT, cellulitis  I ordered, reviewed and interpreted labs, which included CBC with normal white count normal hemoglobin.  Chemistries with mildly elevated glucose of 121.  Troponin elevated at 43 and delta minimally changed.  Markedly elevated BNP of 1615  consistent with CHF I ordered medication IV Lasix I ordered imaging studies which included chest x-ray and I independently    visualized and interpreted imaging which showed cardiomegaly and increased interstitial markings consistent with CHF Previous records obtained and reviewed in epic including last admission with Dr. Pascal Lux from vascular I consulted Dr. [place] cardiology to review EKG and Dr. Welton Flakes hospitalist for admission and discussed lab and imaging findings  Critical Interventions: Recognition and management of acute CHF with IV diuresis  After the interventions stated above, I reevaluated the patient and found patient's breathing to be a little more comfortable.  He will be admitted to the hospital for further work-up and management.   Final Clinical Impression(s) / ED Diagnoses Final diagnoses:  Acute congestive heart failure, unspecified heart failure type (HCC)  Peripheral edema  DOE (dyspnea on exertion)  New onset left bundle branch block (LBBB)    Rx / DC Orders ED Discharge Orders    None       Terrilee Files, MD 08/27/19 1031

## 2019-08-26 NOTE — TOC Initial Note (Signed)
Transition of Care [facility]) - Initial/Assessment Note   Patient Details  Name: Eugene Parker MRN: [id number] Date of Birth: 06-06-1960  [facility] CM/SW Contact:    Sherie Don, LCSW Phone Number: 08/26/2019, 10:27 PM  Clinical Narrative: Patient is a 59 year old male who was admitted to the hospital for acute exacerbation of CHF. TOC received consult for CHF screening. CSW met with patient in ED to complete initial assessment. Per patient, he lives alone in a [place] the [place]. He currently has a cane and walker. Patient reports a history of [facility] for wound care, but was unable to remember the company.  Patient reports he does not eat a heart healthy diet or restrict salt and fluids. Patient is not currently weighing himself daily as he reported he just found out he has CHF today. TOC to follow for possible DME/HH needs.             Expected Discharge Plan: Home/Self Care Barriers to Discharge: Continued Medical Work up  Patient Goals and CMS Choice Patient states their goals for this hospitalization and ongoing recovery are:: Discharge home  Expected Discharge Plan and Services Expected Discharge Plan: Home/Self Care Living arrangements for the past 2 months: Mobile Home(Patient reported he lives in a camping trailer in the woods)  Prior Living Arrangements/Services Living arrangements for the past 2 months: Mobile Home(Patient reported he lives in a camping trailer in the woods) Lives with:: Self Patient language and need for interpreter reviewed:: Yes Do you feel safe going back to the place where you live?: Yes      Need for Family Participation in Patient Care: No (Comment) Care giver support system in place?: Yes (comment)(Eugene Parker. (father))   Criminal Activity/Legal Involvement Pertinent to Current Situation/Hospitalization: No - Comment as needed  Emotional Assessment Appearance:: Appears stated age Attitude/Demeanor/Rapport:  Engaged Affect (typically observed): Accepting Orientation: : Oriented to Self, Oriented to Place, Oriented to  Time, Oriented to Situation Alcohol / Substance Use: Tobacco Use, Alcohol Use Psych Involvement: No (comment)  Admission diagnosis:  Acute exacerbation of CHF (congestive heart failure) ([place]) [I50.9] Patient Active Problem List   Diagnosis Date Noted  . Acute exacerbation of CHF (congestive heart failure) ([place]) 08/26/2019  . PAD (peripheral artery disease) ([place]) 11/01/2017  . Cellulitis and abscess of foot 03/22/2016  . PVD (peripheral vascular disease) ([place]) 03/22/2016  . Hyperlipidemia 07/06/2014  . Elevated LFTs 07/06/2014  . ST elevation myocardial infarction (STEMI) of inferior wall ([place]) 07/04/2014  . Current smoker 07/04/2014  . Essential hypertension 07/04/2014   PCP:  Eugene Court, DO Pharmacy:   [facility], [place] [address] Phone: [phone] Fax: [phone]  Readmission Risk Interventions No flowsheet data found.

## 2019-08-26 NOTE — ED Notes (Signed)
Lab drw for second troponin

## 2019-08-26 NOTE — ED Triage Notes (Signed)
C/o shortness of breath with swelling of legs and groin

## 2019-08-26 NOTE — ED Notes (Signed)
Pt meets inpt criteria   Mother has stated that she was taking otu IVC paperwork It has not been served at this time

## 2019-08-26 NOTE — ED Notes (Signed)
Pt evidently had a fem/pop approx 1 year ago   Is on no blood thinners  No diuretic  Presents with bilateral edema to his legs 4 plus  He complains of shortness of breath   Also continues to smoke

## 2019-08-26 NOTE — ED Notes (Signed)
Pt has thus far had out put of over 1700  He reports feeling better   Has been given ice chips and has been  Seen by the admitting physician   His lights are dimmed, he has a blanket and is comfortable without complaint

## 2019-08-26 NOTE — ED Notes (Signed)
Bed ready   Call for report   Nurse will call back

## 2019-08-27 ENCOUNTER — Observation Stay (HOSPITAL_COMMUNITY): Payer: Medicare Other

## 2019-08-27 DIAGNOSIS — I110 Hypertensive heart disease with heart failure: Secondary | ICD-10-CM | POA: Diagnosis not present

## 2019-08-27 DIAGNOSIS — L97318 Non-pressure chronic ulcer of right ankle with other specified severity: Secondary | ICD-10-CM | POA: Diagnosis not present

## 2019-08-27 DIAGNOSIS — F419 Anxiety disorder, unspecified: Secondary | ICD-10-CM | POA: Diagnosis present

## 2019-08-27 DIAGNOSIS — J449 Chronic obstructive pulmonary disease, unspecified: Secondary | ICD-10-CM | POA: Diagnosis present

## 2019-08-27 DIAGNOSIS — I5021 Acute systolic (congestive) heart failure: Secondary | ICD-10-CM | POA: Diagnosis not present

## 2019-08-27 DIAGNOSIS — I25119 Atherosclerotic heart disease of native coronary artery with unspecified angina pectoris: Secondary | ICD-10-CM | POA: Diagnosis not present

## 2019-08-27 DIAGNOSIS — I4581 Long QT syndrome: Secondary | ICD-10-CM | POA: Diagnosis present

## 2019-08-27 DIAGNOSIS — E876 Hypokalemia: Secondary | ICD-10-CM | POA: Diagnosis not present

## 2019-08-27 DIAGNOSIS — I509 Heart failure, unspecified: Secondary | ICD-10-CM

## 2019-08-27 DIAGNOSIS — E785 Hyperlipidemia, unspecified: Secondary | ICD-10-CM | POA: Diagnosis present

## 2019-08-27 DIAGNOSIS — I255 Ischemic cardiomyopathy: Secondary | ICD-10-CM | POA: Diagnosis not present

## 2019-08-27 DIAGNOSIS — Z9119 Patient's noncompliance with other medical treatment and regimen: Secondary | ICD-10-CM | POA: Diagnosis not present

## 2019-08-27 DIAGNOSIS — M47817 Spondylosis without myelopathy or radiculopathy, lumbosacral region: Secondary | ICD-10-CM | POA: Diagnosis present

## 2019-08-27 DIAGNOSIS — I25118 Atherosclerotic heart disease of native coronary artery with other forms of angina pectoris: Secondary | ICD-10-CM | POA: Diagnosis not present

## 2019-08-27 DIAGNOSIS — I2510 Atherosclerotic heart disease of native coronary artery without angina pectoris: Secondary | ICD-10-CM | POA: Diagnosis not present

## 2019-08-27 DIAGNOSIS — I447 Left bundle-branch block, unspecified: Secondary | ICD-10-CM

## 2019-08-27 DIAGNOSIS — F17210 Nicotine dependence, cigarettes, uncomplicated: Secondary | ICD-10-CM | POA: Diagnosis present

## 2019-08-27 DIAGNOSIS — I480 Paroxysmal atrial fibrillation: Secondary | ICD-10-CM | POA: Diagnosis not present

## 2019-08-27 DIAGNOSIS — R0602 Shortness of breath: Secondary | ICD-10-CM | POA: Diagnosis present

## 2019-08-27 DIAGNOSIS — N5089 Other specified disorders of the male genital organs: Secondary | ICD-10-CM | POA: Diagnosis present

## 2019-08-27 DIAGNOSIS — I272 Pulmonary hypertension, unspecified: Secondary | ICD-10-CM | POA: Diagnosis not present

## 2019-08-27 DIAGNOSIS — F17200 Nicotine dependence, unspecified, uncomplicated: Secondary | ICD-10-CM

## 2019-08-27 DIAGNOSIS — I5082 Biventricular heart failure: Secondary | ICD-10-CM | POA: Diagnosis not present

## 2019-08-27 DIAGNOSIS — I5041 Acute combined systolic (congestive) and diastolic (congestive) heart failure: Secondary | ICD-10-CM

## 2019-08-27 DIAGNOSIS — Z955 Presence of coronary angioplasty implant and graft: Secondary | ICD-10-CM | POA: Diagnosis not present

## 2019-08-27 DIAGNOSIS — I252 Old myocardial infarction: Secondary | ICD-10-CM | POA: Diagnosis not present

## 2019-08-27 DIAGNOSIS — Z9114 Patient's other noncompliance with medication regimen: Secondary | ICD-10-CM | POA: Diagnosis not present

## 2019-08-27 DIAGNOSIS — Z720 Tobacco use: Secondary | ICD-10-CM | POA: Diagnosis not present

## 2019-08-27 DIAGNOSIS — M359 Systemic involvement of connective tissue, unspecified: Secondary | ICD-10-CM | POA: Diagnosis present

## 2019-08-27 DIAGNOSIS — I10 Essential (primary) hypertension: Secondary | ICD-10-CM

## 2019-08-27 DIAGNOSIS — I739 Peripheral vascular disease, unspecified: Secondary | ICD-10-CM

## 2019-08-27 DIAGNOSIS — I5043 Acute on chronic combined systolic (congestive) and diastolic (congestive) heart failure: Secondary | ICD-10-CM | POA: Diagnosis not present

## 2019-08-27 DIAGNOSIS — I5031 Acute diastolic (congestive) heart failure: Secondary | ICD-10-CM | POA: Diagnosis not present

## 2019-08-27 DIAGNOSIS — Z20822 Contact with and (suspected) exposure to covid-19: Secondary | ICD-10-CM | POA: Diagnosis not present

## 2019-08-27 DIAGNOSIS — I5023 Acute on chronic systolic (congestive) heart failure: Secondary | ICD-10-CM | POA: Diagnosis not present

## 2019-08-27 DIAGNOSIS — Z7901 Long term (current) use of anticoagulants: Secondary | ICD-10-CM | POA: Diagnosis not present

## 2019-08-27 LAB — BASIC METABOLIC PANEL
Anion gap: 11 (ref 5–15)
BUN: 15 mg/dL (ref 8–23)
CO2: 25 mmol/L (ref 22–32)
Calcium: 8.8 mg/dL — ABNORMAL LOW (ref 8.9–10.3)
Chloride: 102 mmol/L (ref 98–111)
Creatinine, Ser: 1.1 mg/dL (ref 0.61–1.24)
GFR calc Af Amer: >60 mL/min (ref >60)
GFR calc non Af Amer: >60 mL/min (ref >60)
Glucose, Bld: 136 mg/dL — ABNORMAL HIGH (ref 70–99)
Potassium: 3.6 mmol/L (ref 3.5–5.1)
Sodium: 138 mmol/L (ref 135–145)

## 2019-08-27 LAB — GLUCOSE, CAPILLARY: Glucose-Capillary: 126 mg/dL — ABNORMAL HIGH (ref 70–99)

## 2019-08-27 LAB — ECHOCARDIOGRAM COMPLETE
Height: 71 in
Weight: 3456 oz

## 2019-08-27 MED ORDER — NITROGLYCERIN 0.4 MG SL SUBL: 0.4000 mg | SUBLINGUAL_TABLET | SUBLINGUAL | Status: DC | PRN

## 2019-08-27 MED ORDER — ALBUTEROL SULFATE (2.5 MG/3ML) 0.083% IN NEBU: 2.5000 mg | INHALATION_SOLUTION | Freq: Four times a day (QID) | RESPIRATORY_TRACT | Status: DC | PRN

## 2019-08-27 MED ORDER — POTASSIUM CHLORIDE CRYS ER 20 MEQ PO TBCR
40.0000 meq | EXTENDED_RELEASE_TABLET | ORAL | Status: AC
  Administered 2019-08-27 (×2): 40 meq via ORAL
  Filled 2019-08-27 (×2): qty 2

## 2019-08-27 MED ORDER — ASPIRIN EC 81 MG PO TBEC
81.0000 mg | DELAYED_RELEASE_TABLET | Freq: Every day | ORAL | Status: DC
  Administered 2019-08-28 – 2019-08-29 (×2): 81 mg via ORAL
  Filled 2019-08-27 (×2): qty 1

## 2019-08-27 MED ORDER — ATORVASTATIN CALCIUM 40 MG PO TABS
40.0000 mg | ORAL_TABLET | Freq: Every day | ORAL | Status: DC
  Administered 2019-08-27 – 2019-08-31 (×5): 40 mg via ORAL
  Filled 2019-08-27 (×5): qty 1

## 2019-08-27 MED ORDER — FUROSEMIDE 10 MG/ML IJ SOLN
40.0000 mg | Freq: Two times a day (BID) | INTRAMUSCULAR | Status: DC
  Administered 2019-08-27 – 2019-08-31 (×8): 40 mg via INTRAVENOUS
  Filled 2019-08-27 (×8): qty 4

## 2019-08-27 MED ORDER — ALBUTEROL SULFATE HFA 108 (90 BASE) MCG/ACT IN AERS: 2.0000 | INHALATION_SPRAY | Freq: Four times a day (QID) | RESPIRATORY_TRACT | Status: DC | PRN

## 2019-08-27 NOTE — Progress Notes (Addendum)
PROGRESS NOTE    Eugene Parker  ZOX:[id number] DOB: [birth year] DOA: 08/26/2019 PCP: Hart Robinsons, DO    Brief Narrative:  Patient admitted to the hospital with a working diagnosis of acute on chronic diastolic heart failure exacerbation.  59 year old male with past medical history for hypertension, dyslipidemia, coronary artery disease, peripheral vascular disease, and tobacco dependence who presented with dyspnea and lower extremity edema.  Patient reported progressive and worsening dyspnea to the point where he became symptomatic with minimal efforts, associated with chest tightness and lower extremity edema.  On his initial physical examination his blood pressure 120/87, heart rate 93, temperature 98.1, respiratory rate 14, oxygen saturation 97% on room air.  His lungs had no wheezing or rails, heart S1-S2, present and rhythmic, his abdomen was protuberant, positive bilateral 2+ lower extremity edema. Sodium 135, potassium 3.9, chloride 106, bicarb 20, glucose 121, BUN 15, creatinine 1.0, BNP 1315, troponin I 45, white count 7.9, hemoglobin 13.0, hematocrit 40.7, platelets 192.  SARS COVID-19 negative.  His chest x-ray had bilateral interstitial infiltrates.  EKG 97 bpm, normal axis, left bundle branch block, sinus rhythm, poor R wave progression, J-point elevation V1 to V3, T wave inversions V5 V6.  Assessment & Plan:   Principal Problem:   Acute exacerbation of CHF (congestive heart failure) (HCC) Active Problems:   Current smoker   Essential hypertension   PVD (peripheral vascular disease) (HCC)   1. Acute on chronic systolic heart failure exacerbation, ischemic cardiomyopathy. Patient continue to be volume overloaded, positive lower extremity edema and dyspnea. Urine output over last 24 H is 4,250 ml and systolic blood pressure 122 mmHg. Echocardiogram with LV systolic function 20 to 25%, with global hypokinesis and with decreased RV systolic function.   Will continue  diuresis with furosemide 40 mg IV to target a negative fluid balance. Lisinopril for afterload reduction. Holding on b blockade for now.   Patient at high risk for worsening heart failure.   2. HTN. Continue blood pressure monitoring, continue lisinopril.  3. Dyslipidemia. Will continue atorvastatin.  4. Hypokalemia. Renal function with serum cr at 1,1 with K at 3,6 and serum bicarbonate at 25. Continue K correction with Kcl will give 40 meq x2 and will check on Mg. Follow renal panel in am, avoid hypotension or nephrotoxic medications.    Status is: Observation  The patient will require care spanning > 2 midnights and should be moved to inpatient because: IV treatments appropriate due to intensity of illness or inability to take PO  Dispo: The patient is from: Home              Anticipated d/c is to: Home              Anticipated d/c date is: > 3 days              Patient currently is not medically stable to d/c. Patient continue to need IV diuresis and close inpatient monitoring of hemodynamics and renal function.        DVT prophylaxis: Enoxaparin  Code Status:   full  Family Communication:  No family at the bedside      Consultants:   Cardiology      Subjective: Patient continue to have dyspnea and lower extremity edema, no nausea or vomiting, and no chest pain.   Objective: Vitals:   08/27/19 0223 08/27/19 0448 08/27/19 0921 08/27/19 1300  BP: 123/84 128/89  122/71  Pulse: 90 90  88  Resp: 20 20  20  Temp: 98.7 F (37.1 C) 98.6 F (37 C)  98.5 F (36.9 C)  TempSrc: Oral Oral  Oral  SpO2: 99% 96% 96% 99%  Weight:      Height:        Intake/Output Summary (Last 24 hours) at 08/27/2019 1614 Last data filed at 08/27/2019 1317 Gross per 24 hour  Intake 1180 ml  Output 5650 ml  Net -4470 ml   Filed Weights   08/26/19 1811  Weight: 98 kg    Examination:   General: Not in pain or dyspnea, deconditioned  Neurology: Awake and alert, non focal  E ENT:  mild pallor, no icterus, oral mucosa moist Cardiovascular: No JVD. S1-S2 present, rhythmic, no gallops, rubs, or murmurs. +++ pitting bilateral lower extremity edema. Pulmonary: positive breath sounds bilaterally, no wheezing or rhonchi positive rales at bases. Gastrointestinal. Abdomen with, no organomegaly, non tender, no rebound or guarding Skin. No rashes Musculoskeletal: no joint deformities     Data Reviewed: I have personally reviewed following labs and imaging studies  CBC: Recent Labs  Lab 08/26/19 1825  WBC 7.9  NEUTROABS 5.0  HGB 13.0  HCT 40.7  MCV 91.7  PLT 025   Basic Metabolic Panel: Recent Labs  Lab 08/26/19 1825 08/27/19 0528  NA 135 138  K 3.9 3.6  CL 106 102  CO2 20* 25  GLUCOSE 121* 136*  BUN 15 15  CREATININE 1.08 1.10  CALCIUM 8.7* 8.8*   GFR: Estimated Creatinine Clearance: 84.2 mL/min (by C-G formula based on SCr of 1.1 mg/dL). Liver Function Tests: No results for input(s): AST, ALT, ALKPHOS, BILITOT, PROT, ALBUMIN in the last 168 hours. No results for input(s): LIPASE, AMYLASE in the last 168 hours. No results for input(s): AMMONIA in the last 168 hours. Coagulation Profile: No results for input(s): INR, PROTIME in the last 168 hours. Cardiac Enzymes: No results for input(s): CKTOTAL, CKMB, CKMBINDEX, TROPONINI in the last 168 hours. BNP (last 3 results) No results for input(s): PROBNP in the last 8760 hours. HbA1C: No results for input(s): HGBA1C in the last 72 hours. CBG: Recent Labs  Lab 08/27/19 0447  GLUCAP 126*   Lipid Profile: No results for input(s): CHOL, HDL, LDLCALC, TRIG, CHOLHDL, LDLDIRECT in the last 72 hours. Thyroid Function Tests: No results for input(s): TSH, T4TOTAL, FREET4, T3FREE, THYROIDAB in the last 72 hours. Anemia Panel: No results for input(s): VITAMINB12, FOLATE, FERRITIN, TIBC, IRON, RETICCTPCT in the last 72 hours.    Radiology Studies: I have reviewed all of the imaging during this hospital  visit personally     Scheduled Meds: . [START ON 08/28/2019] aspirin EC  81 mg Oral Daily  . atorvastatin  40 mg Oral q1800  . enoxaparin (LOVENOX) injection  40 mg Subcutaneous Q24H  . furosemide  40 mg Intravenous BID  . lisinopril  10 mg Oral Daily  . sodium chloride flush  3 mL Intravenous Q12H   Continuous Infusions: . sodium chloride       LOS: 0 days        Takeya Marquis Gerome Apley, MD

## 2019-08-27 NOTE — H&P (Addendum)
History and Physical    Eugene Parker PFX:[id number] DOB: Aug 25, 1960 DOA: 08/26/2019  PCP: Hart Robinsons, DO (Confirm with patient/family/NH records and if not entered, this has to be entered at [facility] point of entry) Patient coming from: Home  I have personally briefly reviewed patient's old medical records in [facility]  Chief Complaint: Shortness of breath and leg swelling  HPI: Eugene Parker is a 59 y.o. male with medical history significant of hypertension, hyperlipidemia, coronary artery disease s/p stent placement, peripheral arterial disease s/p femoropopliteal bypass and tobacco dependence presented to ED for evaluation of worsening shortness of breath and swelling of bilateral lower extremities.  Patient states that he have noticed increasing swelling in both lower extremities and some abdominal distention with worsening of shortness of breath on exertion and the condition is worse to a point where he cannot take few steps due to worsening shortness of breath.  Patient also endorses some chest tightness on and off after the leg swelling but he feels that he does not like the cardiac chest pain he had last time when a stent was placed.  Patient otherwise denies fever, chills, sore throat, cough, nausea, vomiting, abdominal pain and urinary symptoms.  Patient also admits of smoking 1 pack of cigarettes daily.  According to the medical records patient had last echocardiogram done in 2019 that showed ejection fraction of 50 to 55% and grade 2 diastolic dysfunction.    ED Course: On arrival to the ED patient had blood pressure of 120/87, heart rate 93, temperature 98.1, respiratory rate 14 and oxygen saturation 97% on room air.  Blood work showed WBC 7.9, hemoglobin 12.0, sodium 135, potassium 3.9, BUN 15, creatinine 1.08, blood glucose 121 and BNP 1615.  Troponin 45.Chest x-ray showed mild cardiomegaly with pulmonary vascular congestion.  EKG showed sinus tachycardia with  atrial premature complexes, LVH with secondary repolarization abnormality anterior infarct,  prolonged QT interval.  Patient was given IV Lasix 40 mg and a dose of morphine in the ED.  ED physician also contacted Dr. Lenis Noon Dr. Minerva Areola at [facility] and he recommended trending cardiac enzymes and further work-up for CHF and recommended to call him back if frequently elevated troponin.   Review of Systems: As per HPI otherwise 10 point review of systems negative.  Unacceptable ROS statements: "10 systems reviewed," "Extensive" (without elaboration).  Acceptable ROS statements: "All others negative," "All others reviewed and are negative," and "All others unremarkable," with at LEAST ONE ROS documented Can't double dip - if using for HPI can't use for ROS  Past Medical History:  Diagnosis Date  . Anxiety   . CAD in native artery    a. STEMI 06/2014 s/p DES to Cx.  . Cellulitis and abscess of foot 03/22/2016  . Collagen vascular disease (HCC)   . Current smoker   . Dental caries    periodontitis  . DJD (degenerative joint disease), lumbosacral   . Essential hypertension   . Heart attack (HCC)   . Hiccups   . Hyperlipemia   . PAD (peripheral artery disease) (HCC)   . Stroke (cerebrum) [facility])     Past Surgical History:  Procedure Laterality Date  . ABDOMINAL AORTOGRAM W/LOWER EXTREMITY N/A 12/21/2016   Procedure: ABDOMINAL AORTOGRAM W/LOWER EXTREMITY;  Surgeon: Maeola Harman, MD;  Location: [facility] INVASIVE CV LAB;  Service: Cardiovascular;  Laterality: N/A;  . ABDOMINAL AORTOGRAM W/LOWER EXTREMITY N/A 11/01/2017   Procedure: ABDOMINAL AORTOGRAM W/LOWER EXTREMITY;  Surgeon: Lemar Livings  Cristal Deer, MD;  Location: [facility] INVASIVE CV LAB;  Service: Cardiovascular;  Laterality: N/A;  . ENDARTERECTOMY FEMORAL Right 11/02/2017   Procedure: ENDARTERECTOMY RIGHT FEMORAL ARTERY;  Surgeon: Maeola Harman, MD;  Location: [facility] OR;  Service: Vascular;  Laterality: Right;  .  FEMORAL-POPLITEAL BYPASS GRAFT Right 11/02/2017   Procedure: BYPASS GRAFT RIGHT FEMORAL TO BELOW KNEE POPLITEAL ARTERY USING RIGHT GREAT SAPHENOUS VEIN ;  Surgeon: Maeola Harman, MD;  Location: [facility] OR;  Service: Vascular;  Laterality: Right;  . INTRADISCAL INJECTION  Oct 2012   L5-S1  . LEFT HEART CATHETERIZATION WITH CORONARY ANGIOGRAM N/A 07/04/2014   Procedure: LEFT HEART CATHETERIZATION WITH CORONARY ANGIOGRAM;  Surgeon: Runell Gess, MD;  Location: [facility] CATH LAB;  Service: Cardiovascular;  Laterality: N/A;  . LEG SURGERY    . PERIPHERAL VASCULAR CATHETERIZATION N/A 02/03/2016   Procedure: Abdominal Aortogram;  Surgeon: Maeola Harman, MD;  Location: [facility] INVASIVE CV LAB;  Service: Cardiovascular;  Laterality: N/A;  . PERIPHERAL VASCULAR CATHETERIZATION N/A 02/03/2016   Procedure: Lower Extremity Angiography;  Surgeon: Maeola Harman, MD;  Location: [facility] INVASIVE CV LAB;  Service: Cardiovascular;  Laterality: N/A;  . PERIPHERAL VASCULAR CATHETERIZATION Right 02/03/2016   Procedure: Peripheral Vascular Intervention;  Surgeon: Maeola Harman, MD;  Location: [facility] INVASIVE CV LAB;  Service: Cardiovascular;  Laterality: Right;  SFA  . TOOTH EXTRACTION N/A 02/02/2018   Procedure: DENTAL RESTORATION/EXTRACTIONS;  Surgeon: Ocie Doyne, DDS;  Location: [facility] OR;  Service: Oral Surgery;  Laterality: N/A;  . VEIN HARVEST Right 11/02/2017   Procedure: VEIN HARVEST RIGHT GREAT SAPHENOUS VEIN;  Surgeon: Maeola Harman, MD;  Location: [facility] OR;  Service: Vascular;  Laterality: Right;     reports that he has been smoking cigarettes. He started smoking about 49 years ago. He has a 40.00 pack-year smoking history. He has never used smokeless tobacco. He reports current alcohol use. He reports previous drug use. Drug: Marijuana.  Allergies  Allergen Reactions  . Codeine Nausea Only    Family History  Problem Relation Age of Onset  . Heart disease Mother   . COPD  Mother   . Heart disease Father     Unacceptable: Noncontributory, unremarkable, or negative. Acceptable: (example)Family history negative for heart disease  Prior to Admission medications   Medication Sig Start Date End Date Taking? Authorizing Provider  nitroGLYCERIN (NITROSTAT) 0.4 MG SL tablet DISSOLVE ONE TABLET UNDER TONGUE EVERY 5 MINUTES UP TO 3 DOSES AS NEEDED FOR CHEST PAIN Patient taking differently: Place 0.4 mg under the tongue every 5 (five) minutes as needed.  02/07/18  Yes Branch, Dorothe Pea, MD  VENTOLIN HFA 108 [phone] Base) MCG/ACT inhaler INHALE BY MOUTH AS DIRECTED Patient taking differently: Inhale 2 puffs into the lungs every 6 (six) hours as needed for wheezing or shortness of breath.  07/07/17  Yes Antoine Poche, MD    Physical Exam: Vitals:   08/26/19 2145 08/26/19 2200 08/26/19 2254 08/27/19 0223  BP:  131/86 (!) 134/91 123/84  Pulse: 95 97 100 90  Resp:   20 20  Temp:   97.8 F (36.6 C) 98.7 F (37.1 C)  TempSrc:   Oral Oral  SpO2: 98% 98% 96% 99%  Weight:      Height:        Constitutional: NAD, calm, comfortable Vitals:   08/26/19 2145 08/26/19 2200 08/26/19 2254 08/27/19 0223  BP:  131/86 (!) 134/91 123/84  Pulse: 95 97 100 90  Resp:   20 20  Temp:   97.8 F (36.6 C) 98.7 F (37.1 C)  TempSrc:   Oral Oral  SpO2: 98% 98% 96% 99%  Weight:      Height:        General: 59 year old Caucasian male in no acute distress. Eyes: PERRL, lids and conjunctivae normal ENMT: Mucous membranes are moist. Posterior pharynx clear of any exudate or lesions.Normal dentition.  Neck: normal, supple, no masses, no thyromegaly Respiratory: clear to auscultation bilaterally, no wheezing, no crackles. Normal respiratory effort. No accessory muscle use.  Cardiovascular: Regular rate and rhythm, no murmurs / rubs / gallops.  2+ bilateral lower extremity edema. 2+ pedal pulses. No carotid bruits.  Abdomen: no tenderness, no masses palpated. No hepatosplenomegaly.  Bowel sounds positive.  Musculoskeletal: no clubbing / cyanosis. No joint deformity upper and lower extremities. Good ROM, no contractures. Normal muscle tone.  Skin: no rashes, lesions, 3.5 to 4 cm ulcer on the right ankle with clean edges and no signs of infection.   Neurologic: CN 2-12 grossly intact. Sensation intact, DTR normal. Strength 5/5 in all 4.  Psychiatric: Normal judgment and insight. Alert and oriented x 3. Normal mood.   (Anything < 9 systems with 2 bullets each down codes to level 1) (If patient refuses exam can't bill higher level) (Make sure to document decubitus ulcers present on admission -- if possible -- and whether patient has chronic indwelling catheter at time of admission)  Labs on Admission: I have personally reviewed following labs and imaging studies  CBC: Recent Labs  Lab 08/26/19 1825  WBC 7.9  NEUTROABS 5.0  HGB 13.0  HCT 40.7  MCV 91.7  PLT 192   Basic Metabolic Panel: Recent Labs  Lab 08/26/19 1825  NA 135  K 3.9  CL 106  CO2 20*  GLUCOSE 121*  BUN 15  CREATININE 1.08  CALCIUM 8.7*   GFR: Estimated Creatinine Clearance: 85.7 mL/min (by C-G formula based on SCr of 1.08 mg/dL). Liver Function Tests: No results for input(s): AST, ALT, ALKPHOS, BILITOT, PROT, ALBUMIN in the last 168 hours. No results for input(s): LIPASE, AMYLASE in the last 168 hours. No results for input(s): AMMONIA in the last 168 hours. Coagulation Profile: No results for input(s): INR, PROTIME in the last 168 hours. Cardiac Enzymes: No results for input(s): CKTOTAL, CKMB, CKMBINDEX, TROPONINI in the last 168 hours. BNP (last 3 results) No results for input(s): PROBNP in the last 8760 hours. HbA1C: No results for input(s): HGBA1C in the last 72 hours. CBG: No results for input(s): GLUCAP in the last 168 hours. Lipid Profile: No results for input(s): CHOL, HDL, LDLCALC, TRIG, CHOLHDL, LDLDIRECT in the last 72 hours. Thyroid Function Tests: No results for  input(s): TSH, T4TOTAL, FREET4, T3FREE, THYROIDAB in the last 72 hours. Anemia Panel: No results for input(s): VITAMINB12, FOLATE, FERRITIN, TIBC, IRON, RETICCTPCT in the last 72 hours. Urine analysis:    Component Value Date/Time   COLORURINE YELLOW 11/01/2017 1826   APPEARANCEUR CLEAR 11/01/2017 1826   LABSPEC 1.015 11/01/2017 1826   PHURINE 6.0 11/01/2017 1826   GLUCOSEU NEGATIVE 11/01/2017 1826   HGBUR SMALL (A) 11/01/2017 1826   BILIRUBINUR NEGATIVE 11/01/2017 1826   KETONESUR NEGATIVE 11/01/2017 1826   PROTEINUR NEGATIVE 11/01/2017 1826   NITRITE NEGATIVE 11/01/2017 1826   LEUKOCYTESUR NEGATIVE 11/01/2017 1826    Radiological Exams on Admission: DG Chest Portable 1 View  Result Date: 08/26/2019 CLINICAL DATA:  Shortness of breath and lower extremity swelling EXAM: PORTABLE CHEST 1 VIEW COMPARISON:  November 21, 2018 FINDINGS: There is mild cardiomegaly. Prominence of the central pulmonary vasculature is seen. There is a probable trace right pleural effusion. Aortic knob calcifications are seen. No large airspace consolidation is noted. No acute osseous abnormality. IMPRESSION: Stable cardiomegaly and pulmonary vascular congestion. Electronically Signed   By: Prudencio Pair M.D.   On: 08/26/2019 19:12      Assessment/Plan Principal Problem:   Acute exacerbation of CHF (congestive heart failure) (HCC) Continue IV Lasix 40 mg daily. Daily weight with intake and output monitoring ordered. Oxygen supplementation with nasal cannula as needed. Echocardiogram ordered. Cardiology consult for evaluation and further recommendations. Low-salt diet ordered.  Active Problems: Elevated troponin Patient had new left bundle branch block and some ST elevations anteriorly with troponin level of 45.  ED physician contacted Dr. Tacy Learn Dr. And he recommended to trend the troponins and treat for acute CHF exacerbation. Troponin are flat and patient has no chest pain.    Current  smoker Tobacco cessation counseling done    Essential hypertension Stable.  Continue home medications    PVD (peripheral vascular disease) ([place]) Patient has a history of femoropopliteal bypass. Continue Eliquis 5 mg twice daily   (please populate well all problems here in Problem List. (For example, if patient is on BP meds at home and you resume or decide to hold them, it is a problem that needs to be her. Same for CAD, COPD, HLD and so on)     DVT prophylaxis: Continue home Eliquis 5 mg twice daily Code Status: Full code Family Communication: No family present at the bedside Disposition Plan: Patient will be discharged to home Consults called: Cardiology Admission status: Observation/telemetry   Edmonia Lynch MD [facility] Pager [phone]   If 7PM-7AM, please contact night-coverage [email or web address] Password   08/27/2019, 4:47 AM   Patient is not using Eliquis at home as per pharmacy therefore we will do the DVT prophylaxis with Lovenox.

## 2019-08-27 NOTE — Progress Notes (Signed)
*  PRELIMINARY RESULTS* Echocardiogram 2D Echocardiogram has been performed.  Jeryl Columbia 08/27/2019, 10:18 AM

## 2019-08-27 NOTE — Consult Note (Addendum)
Cardiology Consult    Patient ID: Suyash Amory; [id number]; [birth year]   Admit date: 08/26/2019 Date of Consult: 08/27/2019  Primary Care Provider: Hart Robinsons, DO Primary Cardiologist: Dina Rich, MD   Patient Profile    Damar Petit II is a 59 y.o. male with past medical history of CAD (s/p STEMI in 06/2014 with DES to LCx), mild ICM (EF 45-50% by cath, at 55-60% by echo at that time, EF 50-55% by echo in 09/2017), paroxysmal atrial fibrillation (previously on Eliquis), PAD (s/p R SFA stenting, right fem-pop bypass in 10/2017), HTN, HLD, prior TIA, COPD and tobacco use who is being seen today for the evaluation of CHF at the request of Dr. Welton Flakes.   History of Present Illness    Mr. Strawser was last examined by Dr. Wyline Mood in 09/2017 and denied any recent chest pain or dyspnea on exertion. He was being considered for femoropopliteal bypass and a stress test was recommended for risk stratification. This showed findings consistent with prior infarct with mild peri-infarct ischemia. EF was read as being reduced at 41% and an echocardiogram was obtained for clarification and showed EF was at 50 to 55% with grade 2 diastolic dysfunction. He underwent fem-pop bypass in 10/2017 with no immediate complications by review of the discharge summary. Was taking ASA 81mg  daily, Eliquis 5mg  BID, Atorvastatin 80mg  daily, Lisinopril 10mg  daily, Gabapentin 300mg  TID, PRN Xanax, Lopressor 25mg  in AM/37.5mg  in PM, Protonix, Percocet and PRN Albuterol.   He presented to [facility] ED on 08/26/2019 for evaluation of worsening dyspnea and lower extremity edema. In talking with the patient today, he reports worsening abdominal distention and lower extremity edema for the past 2 to 3 weeks. Over the past 2 days, he has developed more shortness of breath with exertion and also reports orthopnea and PND. He was unable to sleep for the past few nights leading up to admission. He reports occasional  discomfort along his chest but denies any symptoms resembling his prior angina. No recent palpitations. He does report some dizziness with positional changes.  Says that he discontinued all of his medications approximately 2 years ago due to the passing of his wife. He was not taking any medications regularly prior to admission.  Reports [place] and typically consumes sandwiches on a regular basis. He also consumes fast food frequently.  Initial labs show WBC 7.9, Hgb 13.0, platelets 192, Na+ 135, K+ 3.9, glucose 121 and creatinine 1.08. BNP 1615. Initial HS Troponin 45 with repeat of 43. COVID negative. CXR shows cardiomegaly with pulmonary vascular congestion. EKG shows NSR, HR 97 with PAC's and LBBB which is new when compared to tracings from 2019.  He has been started on IV Lasix 40mg  daily with a recorded output of -3.3L thus far. Weight 225 lbs this AM (previously 201 lbs in 06/2018).    Past Medical History:  Diagnosis Date  . Anxiety   . CAD in native artery    a. STEMI 06/2014 s/p DES to Cx.  . Cellulitis and abscess of foot 03/22/2016  . Collagen vascular disease (HCC)   . Current smoker   . Dental caries    periodontitis  . DJD (degenerative joint disease), lumbosacral   . Essential hypertension   . Heart attack (HCC)   . Hiccups   . Hyperlipemia   . PAD (peripheral artery disease) (HCC)   . Stroke (cerebrum) [facility])     Past Surgical History:  Procedure Laterality Date  . ABDOMINAL AORTOGRAM W/LOWER EXTREMITY N/A 12/21/2016   Procedure: ABDOMINAL AORTOGRAM W/LOWER EXTREMITY;  Surgeon: Maeola Harman, MD;  Location: [facility] INVASIVE CV LAB;  Service: Cardiovascular;  Laterality: N/A;  . ABDOMINAL AORTOGRAM W/LOWER EXTREMITY N/A 11/01/2017   Procedure: ABDOMINAL AORTOGRAM W/LOWER EXTREMITY;  Surgeon: Maeola Harman, MD;  Location: [facility] INVASIVE CV LAB;  Service: Cardiovascular;  Laterality: N/A;  . ENDARTERECTOMY FEMORAL Right 11/02/2017     Procedure: ENDARTERECTOMY RIGHT FEMORAL ARTERY;  Surgeon: Maeola Harman, MD;  Location: [facility] OR;  Service: Vascular;  Laterality: Right;  . FEMORAL-POPLITEAL BYPASS GRAFT Right 11/02/2017   Procedure: BYPASS GRAFT RIGHT FEMORAL TO BELOW KNEE POPLITEAL ARTERY USING RIGHT GREAT SAPHENOUS VEIN ;  Surgeon: Maeola Harman, MD;  Location: [facility] OR;  Service: Vascular;  Laterality: Right;  . INTRADISCAL INJECTION  Oct 2012   L5-S1  . LEFT HEART CATHETERIZATION WITH CORONARY ANGIOGRAM N/A 07/04/2014   Procedure: LEFT HEART CATHETERIZATION WITH CORONARY ANGIOGRAM;  Surgeon: Runell Gess, MD;  Location: [facility] CATH LAB;  Service: Cardiovascular;  Laterality: N/A;  . LEG SURGERY    . PERIPHERAL VASCULAR CATHETERIZATION N/A 02/03/2016   Procedure: Abdominal Aortogram;  Surgeon: Maeola Harman, MD;  Location: [facility] INVASIVE CV LAB;  Service: Cardiovascular;  Laterality: N/A;  . PERIPHERAL VASCULAR CATHETERIZATION N/A 02/03/2016   Procedure: Lower Extremity Angiography;  Surgeon: Maeola Harman, MD;  Location: [facility] INVASIVE CV LAB;  Service: Cardiovascular;  Laterality: N/A;  . PERIPHERAL VASCULAR CATHETERIZATION Right 02/03/2016   Procedure: Peripheral Vascular Intervention;  Surgeon: Maeola Harman, MD;  Location: [facility] INVASIVE CV LAB;  Service: Cardiovascular;  Laterality: Right;  SFA  . TOOTH EXTRACTION N/A 02/02/2018   Procedure: DENTAL RESTORATION/EXTRACTIONS;  Surgeon: Ocie Doyne, DDS;  Location: [facility] OR;  Service: Oral Surgery;  Laterality: N/A;  . VEIN HARVEST Right 11/02/2017   Procedure: VEIN HARVEST RIGHT GREAT SAPHENOUS VEIN;  Surgeon: Maeola Harman, MD;  Location: [facility] OR;  Service: Vascular;  Laterality: Right;     Home Medications:  Prior to Admission medications   Medication Sig Start Date End Date Taking? Authorizing Provider  nitroGLYCERIN (NITROSTAT) 0.4 MG SL tablet DISSOLVE ONE TABLET UNDER TONGUE EVERY 5 MINUTES UP TO 3 DOSES AS  NEEDED FOR CHEST PAIN Patient taking differently: Place 0.4 mg under the tongue every 5 (five) minutes as needed.  02/07/18  Yes Branch, Dorothe Pea, MD  VENTOLIN HFA 108 [phone] Base) MCG/ACT inhaler INHALE BY MOUTH AS DIRECTED Patient taking differently: Inhale 2 puffs into the lungs every 6 (six) hours as needed for wheezing or shortness of breath.  07/07/17  Yes Branch, Dorothe Pea, MD    Inpatient Medications: Scheduled Meds: . enoxaparin (LOVENOX) injection  40 mg Subcutaneous Q24H  . furosemide  40 mg Intravenous Daily  . lisinopril  10 mg Oral Daily  . sodium chloride flush  3 mL Intravenous Q12H   Continuous Infusions: . sodium chloride     PRN Meds: sodium chloride, acetaminophen, albuterol, nitroGLYCERIN, ondansetron (ZOFRAN) IV, sodium chloride flush  Allergies:    Allergies  Allergen Reactions  . Codeine Nausea Only    Social History:   Social History   Socioeconomic History  . Marital status: Single    Spouse name: Not on file  . Number of children: Not on file  . Years of education: Not on file  . Highest education level: Not on file  Occupational History  . Not on file  Tobacco Use  .  Smoking status: Current Every Day Smoker    Packs/day: 1.00    Years: 40.00    Pack years: 40.00    Types: Cigarettes    Start date: 04/22/1970  . Smokeless tobacco: Never Used  . Tobacco comment: 1 pack per 2 days  Substance and Sexual Activity  . Alcohol use: Yes    Alcohol/week: 0.0 standard drinks    Comment: occasisional beer  . Drug use: Not Currently    Types: Marijuana  . Sexual activity: Not on file  Other Topics Concern  . Not on file  Social History Narrative  . Not on file   Social Determinants of Health   Financial Resource Strain:   . Difficulty of Paying Living Expenses:   Food Insecurity:   . Worried About Programme researcher, broadcasting/film/video in the Last Year:   . Barista in the Last Year:   Transportation Needs:   . Freight forwarder (Medical):   Marland Kitchen  Lack of Transportation (Non-Medical):   Physical Activity:   . Days of Exercise per Week:   . Minutes of Exercise per Session:   Stress:   . Feeling of Stress :   Social Connections:   . Frequency of Communication with Friends and Family:   . Frequency of Social Gatherings with Friends and Family:   . Attends Religious Services:   . Active Member of Clubs or Organizations:   . Attends Banker Meetings:   Marland Kitchen Marital Status:   Intimate Partner Violence:   . Fear of Current or Ex-Partner:   . Emotionally Abused:   Marland Kitchen Physically Abused:   . Sexually Abused:      Family History:    Family History  Problem Relation Age of Onset  . Heart disease Mother   . COPD Mother   . Heart disease Father       Review of Systems    General:  No chills, fever, night sweats or weight changes.  Cardiovascular:  No chest pain or palpitations. Positive for dyspnea on exertion, edema and orthopnea.  Dermatological: No rash, lesions/masses Respiratory: No cough, Positive for dyspnea. Urologic: No hematuria, dysuria Abdominal:   No nausea, vomiting, diarrhea, bright red blood per rectum, melena, or hematemesis Neurologic:  No visual changes, wkns, changes in mental status. All other systems reviewed and are otherwise negative except as noted above.  Physical Exam/Data    Vitals:   08/26/19 2200 08/26/19 2254 08/27/19 0223 08/27/19 0448  BP: 131/86 (!) 134/91 123/84 128/89  Pulse: 97 100 90 90  Resp:  Temp:  97.8 F (36.6 C) 98.7 F (37.1 C) 98.6 F (37 C)  TempSrc:  Oral Oral Oral  SpO2: 98% 96% 99% 96%  Weight:      Height:        Intake/Output Summary (Last 24 hours) at 08/27/2019 0853 Last data filed at 08/27/2019 0448 Gross per 24 hour  Intake 700 ml  Output 4250 ml  Net -3550 ml   Filed Weights   08/26/19 1811  Weight: 98 kg   Body mass index is 30.13 kg/m.   General: Pleasant male appearing in NAD Psych: Normal affect. Neuro: Alert and oriented X  3. Moves all extremities spontaneously. HEENT: Normal  Neck: Supple without bruits or JVD. Lungs:  Resp regular and unlabored, decreased breath sounds along bases bilaterally. Heart: RRR no s3, s4, or murmurs. Abdomen: Soft, non-tender,  BS + x 4. Appears distended.  Extremities: No clubbing  or cyanosis. 2+ pitting edema along lower extremities. DP/PT/Radials 2+ and equal bilaterally.   EKG:  The EKG was personally reviewed and demonstrates: NSR, HR 97 with PAC's and LBBB which is new when compared to tracings from 2019.  Telemetry:  Telemetry was personally reviewed and demonstrates: NSR, HR in 80's to 90's with PAC's.    Labs/Studies     Relevant CV Studies:  NST: 09/2017  Blood pressure demonstrated a normal response to exercise.  There was no ST segment deviation noted during stress.  Findings consistent with prior myocardial infarction with peri-infarct ischemia.  This is an intermediate risk study.  The left ventricular ejection fraction is moderately decreased (30-44%).   Moderate sized inferior lateral wall MI from apex to base. Distal anterior wall and apical infarct with mild peri infarct ischemia EF decreased at 41%  Echocardiogram: 09/2017 Study Conclusions   - Left ventricle: The cavity size was normal. Systolic function was  normal. The estimated ejection fraction was in the range of 50%  to 55%. Features are consistent with a pseudonormal left  ventricular filling pattern, with concomitant abnormal relaxation  and increased filling pressure (grade 2 diastolic dysfunction).  Doppler parameters are consistent with indeterminate ventricular  filling pressure. Mild focal basal septal hypertrophy.  - Regional wall motion abnormality: Hypokinesis of the mid inferior  and basal-mid inferolateral myocardium.  - Aortic valve: Moderately calcified annulus. Mildly thickened  leaflets.  - Left atrium: The atrium was mildly dilated.  - Systemic  veins: IVC is dilated with normal respiratory variation.  Estimated CVP 8 mmHg.   Laboratory Data:  Chemistry Recent Labs  Lab 08/26/19 1825 08/27/19 0528  NA 135 138  K 3.9 3.6  CL 106 102  CO2 20* 25  GLUCOSE 121* 136*  BUN 15 15  CREATININE 1.08 1.10  CALCIUM 8.7* 8.8*  GFRNONAA >60 >60  GFRAA >60 >60  ANIONGAP 9 11    No results for input(s): PROT, ALBUMIN, AST, ALT, ALKPHOS, BILITOT in the last 168 hours. Hematology Recent Labs  Lab 08/26/19 1825  WBC 7.9  RBC 4.44  HGB 13.0  HCT 40.7  MCV 91.7  MCH 29.3  MCHC 31.9  RDW 16.7*  PLT 192   Cardiac EnzymesNo results for input(s): TROPONINI in the last 168 hours. No results for input(s): TROPIPOC in the last 168 hours.  BNP Recent Labs  Lab 08/26/19 1825  BNP 1,615.0*    DDimer No results for input(s): DDIMER in the last 168 hours.  Radiology/Studies:  DG Chest Portable 1 View  Result Date: 08/26/2019 CLINICAL DATA:  Shortness of breath and lower extremity swelling EXAM: PORTABLE CHEST 1 VIEW COMPARISON:  November 21, 2018 FINDINGS: There is mild cardiomegaly. Prominence of the central pulmonary vasculature is seen. There is a probable trace right pleural effusion. Aortic knob calcifications are seen. No large airspace consolidation is noted. No acute osseous abnormality. IMPRESSION: Stable cardiomegaly and pulmonary vascular congestion. Electronically Signed   By: Jonna Clark M.D.   On: 08/26/2019 19:12     Assessment & Plan    1. Acute CHF Exacerbation (echocardiogram pending to determine systolic vs.diastolic dysfunction) - he presented with worsening edema and abdominal distension for the past 2 to 3 weeks and dyspnea on exertion and orthopnea for 2-3 days. He self-discontinued all of his medications approximately 2 years ago after his wife passed away and was consuming a high-sodium diet prior to admission.  - BNP 1615. CXR shows cardiomegaly with pulmonary vascular congestion. Echo pending.  -  he has  been started on IV Lasix 40mg  daily with a recorded output of -3.3L thus far. Was not weighing himself daily but weight was at 225 lbs this AM and previously 201 lbs in 06/2018. Given that he is significantly volume overloaded by examination, will titrate Lasix from 40mg  daily to 40mg  BID. Follow I&O's along with daily weights. I reviewed with him he will need to remain in the hospital for several days to allow for adequate diuresis as he is likely 20+ lbs above his dry weight.   2. CAD - he is s/p STEMI in 06/2014 with DES to LCx. He has experienced worsening dyspnea on exertion as outlined above.  - HS Troponin values flat at 45 and 43. EKG does show a new LBBB. Echocardiogram pending to assess EF and wall motion.  - will restart ASA 81mg  daily and Atorvastatin 40mg  daily. He will need repeat FLP and LFT's in 6-8 weeks.   3. Paroxysmal Atrial Fibrillation - he denies any recent palpitations and is in NSR this admission. Would plan to restart BB therapy once respiratory status improves.  - he was previously on Eliquis but discontinued all of his medications approximately two years ago when his wife passed away. Would plan to restart Eliquis once determined no invasive procedures will be indicated this admission.   4. PAD - s/p R SFA stenting with right fem-pop bypass in 10/2017. Will restart ASA and statin. He will need to reestablish with Vascular Surgery as an outpatient.   5. HTN - BP has been at 120/82 - 134/91 within the past 24 hours. He has been restarted on Lisinopril 10mg  daily by the admitting team. Hold BB for now until respiratory status improves.   6. Tobacco Use - currently smoking 1 ppd. Cessation advised.    For questions or updates, please contact [place] Please consult [email or web address] for contact info under Cardiology/STEMI.  Signed, Erma Heritage, PA-C 08/27/2019, 8:53 AM Pager: [phone]  The patient was seen and examined, and I agree with the history,  physical exam, assessment and plan as documented above, with modifications made above and as noted below. I have also personally reviewed all relevant documentation, old records, labs, and both radiographic and cardiovascular studies. I have also independently interpreted old and new ECG's.  Mr. Brodzinski is a very pleasant 59 year old male with a history of coronary artery disease with drug-eluting stent placement to the left circumflex coronary artery in March 2016.  He also has paroxysmal atrial fibrillation and peripheral vascular disease with a history of right SFA stenting and right femoropopliteal bypass in July 2019.  Most recent echocardiogram performed in June 2019 demonstrated LVEF 50 to 55%.  He also has a history of hypertension, hyperlipidemia, previous TIA, COPD, and tobacco use.  His wife of 28 years passed away of cirrhosis at [facility] on 05/31/2017.  While they did not have any children together, he helped raise her 2 daughters.  Since that time he quit taking all of his medications.  He lives in a trailer [place] the woods near the [place].  He is on disability.  He maintains a garden and has a dog as well as Physicist, medical.  His son and another friend visit him on occasion.  He primarily eats sandwiches and buys canned tuna and eats crackers and eats occasional hamburgers.  Over the last 3 weeks he has noticed progressive exertional dyspnea, leg swelling, abdominal distention, and scrotal swelling.  Over the last 3 days  he has not been able to lie down flat due to shortness of breath.  He denies exertional chest pain.  He denies palpitations and syncope.  He presented to the ED yesterday and was found to have a new left bundle branch block.  Labs are reviewed above with creatinine of 1.08 and BNP of 1615.  Troponins were nonspecifically elevated.  Chest x-ray showed cardiomegaly and pulmonary vascular congestion.  He was started on IV Lasix 40 mg daily with 3.3 L  output.  His weight was 225 pounds this morning and he weighed 201 pounds in March 2020.  He told me that he generally weighs anywhere between 210 and 215 pounds more recently.  Most recent nuclear stress test from June 2019 reviewed above with prior myocardial infarction and peri-infarct ischemia.  I personally reviewed the echocardiogram performed today which demonstrates severely reduced LV systolic function, LVEF 20 to 45%25%, global hypokinesis, and restrictive diastolic dysfunction with elevated LV filling pressures.  Recommendations: He is significantly volume overloaded.  We will increase IV Lasix to 40 mg twice daily.  Given new left bundle branch block with new severely reduced LV systolic function, he will require cardiac catheterization once he is no longer orthopneic ([facility] deemed cardiac [facility] is feasible). We will treat coronary disease medically with aspirin and atorvastatin.  Will treat heart failure with lisinopril 10 mg daily.  I will withhold beta-blockers given concern for low cardiac output.  I will also withhold initiation of Eliquis given need for cardiac catheterization in the near future.  He will likely require cardiac resynchronization therapy but will need to define coronary anatomy first to see if revascularization is possible.   Prentice DockerSuresh Clois Montavon, MD, [facility] Bern[facility] CenterFACC  08/27/2019 10:36 AM

## 2019-08-27 NOTE — Care Management Obs Status (Signed)
MEDICARE OBSERVATION STATUS NOTIFICATION   Patient Details  Name: Eugene Parker MRN: [id number] Date of Birth: [birth year]   Medicare Observation Status Notification Given:  Yes    Corey Harold 08/27/2019, 3:37 PM

## 2019-08-28 DIAGNOSIS — I519 Heart disease, unspecified: Secondary | ICD-10-CM

## 2019-08-28 DIAGNOSIS — I5082 Biventricular heart failure: Secondary | ICD-10-CM

## 2019-08-28 LAB — BASIC METABOLIC PANEL
Anion gap: 11 (ref 5–15)
BUN: 18 mg/dL (ref 8–23)
CO2: 24 mmol/L (ref 22–32)
Calcium: 8.4 mg/dL — ABNORMAL LOW (ref 8.9–10.3)
Chloride: 101 mmol/L (ref 98–111)
Creatinine, Ser: 1.06 mg/dL (ref 0.61–1.24)
GFR calc Af Amer: >60 mL/min (ref >60)
GFR calc non Af Amer: >60 mL/min (ref >60)
Glucose, Bld: 126 mg/dL — ABNORMAL HIGH (ref 70–99)
Potassium: 3.5 mmol/L (ref 3.5–5.1)
Sodium: 136 mmol/L (ref 135–145)

## 2019-08-28 LAB — HIV ANTIBODY (ROUTINE TESTING W REFLEX): HIV Screen 4th Generation wRfx: NONREACTIVE

## 2019-08-28 LAB — MAGNESIUM: Magnesium: 1.9 mg/dL (ref 1.7–2.4)

## 2019-08-28 MED ORDER — COLLAGENASE 250 UNIT/GM EX OINT
TOPICAL_OINTMENT | Freq: Every day | CUTANEOUS | Status: DC
  Administered 2019-08-31 – 2019-09-01 (×2): 1 via TOPICAL
  Filled 2019-08-28 (×3): qty 30

## 2019-08-28 MED ORDER — HYDROCODONE-ACETAMINOPHEN 5-325 MG PO TABS
1.0000 | ORAL_TABLET | Freq: Four times a day (QID) | ORAL | Status: DC | PRN
  Administered 2019-08-28 – 2019-09-01 (×15): 1 via ORAL
  Filled 2019-08-28 (×15): qty 1

## 2019-08-28 MED ORDER — POTASSIUM CHLORIDE CRYS ER 20 MEQ PO TBCR
40.0000 meq | EXTENDED_RELEASE_TABLET | Freq: Once | ORAL | Status: AC
  Administered 2019-08-28: 40 meq via ORAL
  Filled 2019-08-28: qty 2

## 2019-08-28 NOTE — Consult Note (Signed)
WOC Nurse Consult Note: Reason for Consult: Consult requested for right leg wound.  Performed remotely after review of photos in the the EMR and progress notes. Wound type: Right anterior foot, near inner ankle, with chronic full thickness wound Wound bed: 10% red, 90% yellow slough Drainage (amount, consistency, odor) mod amt yellow drainage, no odor Periwound: intact skin surrounding Dressing procedure/placement/frequency: Topical treatment orders provided for bedside nurses to perform as follows to provide enzymatic debridement: Apply Santyl to right foot wound Q day, then cover with moist gauze and foam dressing.  (Change foam dressing Q 3 days or PRN soiling.) Please re-consult if further assistance is needed.  Thank-you,  Cammie Mcgee MSN, RN, CWOCN, [place], CNS [phone]

## 2019-08-28 NOTE — Progress Notes (Addendum)
PROGRESS NOTE  Eugene Parker XIH:[id number] DOB: 05/16/1960 DOA: 08/26/2019 PCP: Hart Robinsons, DO  Brief History:  59 year old male with a history of coronary artery disease, peripheral vascular disease(s/p R SFA stenting, right fem-pop bypass in 10/2017), ischemic cardiomyopathy (EF 45-50% by cath, at 55-60% by echo at that time, EF 50-55% by echo in 09/2017), paroxysmal atrial fibrillation, hypertension, hyperlipidemia, COPD, tobacco abuse presenting with 2 to 3-day history of worsening shortness of breath and orthopnea type symptoms.  The patient had been complaining of increasing lower extremity edema, abdominal distention, shortness of breath for about 3 weeks.  He states that he quit taking all his medications and quit seeing his physicians approximately 2 years ago after his wife passed away.  He denies any fevers, chills, chest pain, nausea, vomiting, diarrhea, abdominal pain.  The patient was started on IV furosemide.  Cardiology was consulted to assist with management.  Repeat echocardiogram on 08/27/2019 showed EF 20-25% with grade 3 DD.  Assessment/Plan: Acute on chronic systolic and diastolic CHF -08/27/2019 echo EF 20-25%, grade 3 DD, severe decreased RV function -Continue IV furosemide -Accurate I's and O's -Appreciate cardiology follow-up -continue lisinopril  Paroxysmal atrial fibrillation -Restart apixaban when no further invasive procedures planned -Currently in sinus rhythm  Peripheral arterial disease - s/p R SFA stenting with right fem-pop bypass in 10/2017. Will restart ASA and statin. He will need to reestablish with Vascular Surgery as an outpatient.   Essential hypertension -Controlled -Continue lisinopril  Coronary artery disease -No chest pain presently -Restart aspirin and statin  Tobacco abuse -Tobacco cessation discussed  Right lower extremity wound -Wound care consult -See pictures below  Hypokalemia -replete -check  mag        Disposition Plan: Patient From: Home D/C Place: Home- 2-3  Days Barriers: Not Clinically Stable--remains fluid overloaded on IV lasix  Family Communication:  No  Family at bedside  Consultants:  cardiology  Code Status:  FULL   DVT Prophylaxis:  apixaban   Procedures: As Listed in Progress Note Above  Antibiotics: None     Subjective: Patient remains short of breath with minimal exertion.  He denies any fevers, chills, chest pain, nausea, vomiting, diarrhea, abdominal pain.  There is no dysuria or hematuria.  Objective: Vitals:   08/27/19 1300 08/28/19 0529 08/28/19 0530 08/28/19 0856  BP: 122/71  110/72 97/64  Pulse: 88  86 67  Resp: 20  19   Temp: 98.5 F (36.9 C)  97.9 F (36.6 C)   TempSrc: Oral  Oral   SpO2: 99%  99%   Weight:  99.6 kg    Height:        Intake/Output Summary (Last 24 hours) at 08/28/2019 0903 Last data filed at 08/28/2019 0800 Gross per 24 hour  Intake 600 ml  Output 3300 ml  Net -2700 ml   Weight change: 1.633 kg Exam:   General:  Pt is alert, follows commands appropriately, not in acute distress  HEENT: No icterus, No thrush, No neck mass, [place]/AT  Cardiovascular: RRR, S1/S2, no rubs, no gallops  Respiratory: diminished breath sounds.  Bibasilar crackles. No wheeze  Abdomen: Soft/+BS, non tender, non distended, no guarding  Extremities: w+LE edema, No lymphangitis, No petechiae, No rashes, no synovitis          Data Reviewed: I have personally reviewed following labs and imaging studies Basic Metabolic Panel: Recent Labs  Lab 08/26/19 1825 08/27/19 0528 08/28/19 0514  NA  135 138 136  K 3.9 3.6 3.5  CL 106 102 101  CO2 20* 25 24  GLUCOSE 121* 136* 126*  BUN 15 15 18   CREATININE 1.08 1.10 1.06  CALCIUM 8.7* 8.8* 8.4*  MG  --   --  1.9   Liver Function Tests: No results for input(s): AST, ALT, ALKPHOS, BILITOT, PROT, ALBUMIN in the last 168 hours. No results for input(s): LIPASE, AMYLASE in  the last 168 hours. No results for input(s): AMMONIA in the last 168 hours. Coagulation Profile: No results for input(s): INR, PROTIME in the last 168 hours. CBC: Recent Labs  Lab 08/26/19 1825  WBC 7.9  NEUTROABS 5.0  HGB 13.0  HCT 40.7  MCV 91.7  PLT 192   Cardiac Enzymes: No results for input(s): CKTOTAL, CKMB, CKMBINDEX, TROPONINI in the last 168 hours. BNP: Invalid input(s): POCBNP CBG: Recent Labs  Lab 08/27/19 0447  GLUCAP 126*   HbA1C: No results for input(s): HGBA1C in the last 72 hours. Urine analysis:    Component Value Date/Time   COLORURINE YELLOW 11/01/2017 1826   APPEARANCEUR CLEAR 11/01/2017 1826   LABSPEC 1.015 11/01/2017 1826   PHURINE 6.0 11/01/2017 1826   GLUCOSEU NEGATIVE 11/01/2017 1826   HGBUR SMALL (A) 11/01/2017 1826   BILIRUBINUR NEGATIVE 11/01/2017 1826   KETONESUR NEGATIVE 11/01/2017 1826   PROTEINUR NEGATIVE 11/01/2017 1826   NITRITE NEGATIVE 11/01/2017 1826   LEUKOCYTESUR NEGATIVE 11/01/2017 1826   Sepsis Labs: @LABRCNTIP (procalcitonin:4,lacticidven:4) ) Recent Results (from the past 240 hour(s))  SARS Coronavirus 2 by RT PCR (hospital order, performed in [place] hospital lab) Nasopharyngeal Nasopharyngeal Swab     Status: None   Collection Time: 08/26/19  7:40 PM   Specimen: Nasopharyngeal Swab  Result Value Ref Range Status   SARS Coronavirus 2 NEGATIVE NEGATIVE Final    Comment: (NOTE) SARS-CoV-2 target nucleic acids are NOT DETECTED. The SARS-CoV-2 RNA is generally detectable in upper and lower respiratory specimens during the acute phase of infection. The lowest concentration of SARS-CoV-2 viral copies this assay can detect is 250 copies / mL. A negative result does not preclude SARS-CoV-2 infection and should not be used as the sole basis for treatment or other patient management decisions.  A negative result may occur with improper specimen collection / handling, submission of specimen other than nasopharyngeal swab,  presence of viral mutation(s) within the areas targeted by this assay, and inadequate number of viral copies (<250 copies / mL). A negative result must be combined with clinical observations, patient history, and epidemiological information. Fact Sheet for Patients:   StrictlyIdeas.no Fact Sheet for Healthcare Providers: BankingDealers.co.za This test is not yet approved or cleared  by the [place] FDA and has been authorized for detection and/or diagnosis of SARS-CoV-2 by FDA under an Emergency Use Authorization (EUA).  This EUA will remain in effect (meaning this test can be used) for the duration of the COVID-19 declaration under Section 564(b)(1) of the Act, 21 U.S.C. section 360bbb-3(b)(1), unless the authorization is terminated or revoked sooner. Performed at [facility], [address] [place],  [address]      Scheduled Meds: . aspirin EC  81 mg Oral Daily  . atorvastatin  40 mg Oral q1800  . furosemide  40 mg Intravenous BID  . lisinopril  10 mg Oral Daily  . sodium chloride flush  3 mL Intravenous Q12H   Continuous Infusions: . sodium chloride      Procedures/Studies: DG Chest Portable 1 View  Result Date: 08/26/2019  CLINICAL DATA:  Shortness of breath and lower extremity swelling EXAM: PORTABLE CHEST 1 VIEW COMPARISON:  November 21, 2018 FINDINGS: There is mild cardiomegaly. Prominence of the central pulmonary vasculature is seen. There is a probable trace right pleural effusion. Aortic knob calcifications are seen. No large airspace consolidation is noted. No acute osseous abnormality. IMPRESSION: Stable cardiomegaly and pulmonary vascular congestion. Electronically Signed   By: Jonna Clark M.D.   On: 08/26/2019 19:12   ECHOCARDIOGRAM COMPLETE  Result Date: 08/27/2019    ECHOCARDIOGRAM REPORT   Patient Name:   Eugene Parker Date of Exam: 08/27/2019 Medical Rec #:  [id number]              Height:       71.0  in Accession #:    [id number]             Weight:       216.0 lb Date of Birth:  1960-05-24               BSA:          2.179 m Patient Age:    59 years               BP:           128/89 mmHg Patient Gender: M                      HR:           90 bpm. [facility]am [facility] Procedure: 2D Echo Indications:    Congestive Heart Failure 428.0 / I50.9  History:        Patient has prior history of Echocardiogram examinations, most                 recent 10/06/2017. CHF, Previous Myocardial Infarction; Risk                 Factors:Current Smoker, Dyslipidemia and Hypertension.  Sonographer:    Jeryl Columbia RDCS (AE) Referring Phys: [id number] [facility] KHAN IMPRESSIONS  1. Left ventricular ejection fraction, by estimation, is 20 to 25%. The left ventricle has severely decreased function. The left ventricle demonstrates global hypokinesis. There is mild left ventricular hypertrophy of the basal-septal segment. Left ventricular diastolic parameters are consistent with Grade III diastolic dysfunction (restrictive). Elevated left ventricular end-diastolic pressure.  2. Right ventricular systolic function is severely reduced. The right ventricular size is mildly enlarged.  3. Left atrial size was mild to moderately dilated.  4. Right atrial size was mildly dilated.  5. The mitral valve is grossly normal. Mild mitral valve regurgitation.  6. The aortic valve is tricuspid. Aortic valve regurgitation is not visualized.  7. The inferior vena cava is dilated in size with <50% respiratory variability, suggesting right atrial pressure of 15 mmHg. FINDINGS  Left Ventricle: Left ventricular ejection fraction, by estimation, is 20 to 25%. The left ventricle has severely decreased function. The left ventricle demonstrates global hypokinesis. The left ventricular internal cavity size was normal in size. There is mild left ventricular hypertrophy of the basal-septal segment. Abnormal (paradoxical) septal motion, consistent with left  bundle branch block. Left ventricular diastolic parameters are consistent with Grade III diastolic dysfunction (restrictive). Elevated left ventricular end-diastolic pressure.  LV Wall Scoring: The posterior wall is hypokinetic. Right Ventricle: The right ventricular size is mildly enlarged. No increase in right ventricular wall thickness. Right ventricular systolic function is severely reduced. Left Atrium: Left atrial size  was mild to moderately dilated. Right Atrium: Right atrial size was mildly dilated. Pericardium: There is no evidence of pericardial effusion. Mitral Valve: The mitral valve is grossly normal. Mild mitral annular calcification. Mild mitral valve regurgitation. Tricuspid Valve: The tricuspid valve is grossly normal. Tricuspid valve regurgitation is trivial. Aortic Valve: The aortic valve is tricuspid. . There is mild thickening of the aortic valve. Aortic valve regurgitation is not visualized. There is mild thickening of the aortic valve. Pulmonic Valve: The pulmonic valve was grossly normal. Pulmonic valve regurgitation is not visualized. Aorta: The aortic root is normal in size and structure. Venous: The inferior vena cava is dilated in size with less than 50% respiratory variability, suggesting right atrial pressure of 15 mmHg. IAS/Shunts: No atrial level shunt detected by color flow Doppler. Prentice Docker MD Electronically signed by Prentice Docker MD Signature Date/Time: 08/27/2019/10:35:05 AM    Final     Catarina Hartshorn, DO  [facility]  If 7PM-7AM, please contact night-coverage [email or web address] Password [email or web address] 08/28/2019, 9:03 AM   LOS: 1 day

## 2019-08-28 NOTE — Progress Notes (Addendum)
Progress Note  Patient Name: Eugene Parker Date of Encounter: 08/28/2019  Primary Cardiologist: Dina Rich, MD   Subjective   Breathing improved but still had to sleep with the head of his bed elevated overnight. No chest pain or palpitations.   Inpatient Medications    Scheduled Meds: . aspirin EC  81 mg Oral Daily  . atorvastatin  40 mg Oral q1800  . furosemide  40 mg Intravenous BID  . lisinopril  10 mg Oral Daily  . sodium chloride flush  3 mL Intravenous Q12H   Continuous Infusions: . sodium chloride     PRN Meds: sodium chloride, acetaminophen, albuterol, nitroGLYCERIN, ondansetron (ZOFRAN) IV, sodium chloride flush   Vital Signs    Vitals:   08/27/19 0921 08/27/19 1300 08/28/19 0529 08/28/19 0530  BP:  122/71  110/72  Pulse:  88  86  Resp:  20  19  Temp:  98.5 F (36.9 C)  97.9 F (36.6 C)  TempSrc:  Oral  Oral  SpO2: 96% 99%  99%  Weight:   99.6 kg   Height:        Intake/Output Summary (Last 24 hours) at 08/28/2019 0804 Last data filed at 08/28/2019 0424 Gross per 24 hour  Intake 720 ml  Output 3100 ml  Net -2380 ml    Last 3 Weights 08/28/2019 08/26/2019 07/06/2018  Weight (lbs) 219 lb 9.6 oz 216 lb 201 lb 12.8 oz  Weight (kg) 99.61 kg 97.977 kg 91.536 kg      Telemetry    NSR, HR in 70's to 80's with occasional PVC's.  - Personally Reviewed  ECG   No new tracings.   Physical Exam   General: Well developed, well nourished Caucasian male appearing in no acute distress. Head: Normocephalic, atraumatic.  Neck: Supple without bruits, JVD at 8 cm. Lungs:  Resp regular and unlabored, rales along bases bilaterally. Heart: RRR, S1, S2, no S3, S4, or murmur; no rub. Abdomen: Soft, non-tender, non-distended with normoactive bowel sounds. No hepatomegaly. No rebound/guarding. No obvious abdominal masses. Extremities: No clubbing or cyanosis, 2+ pitting edema bilaterally. Distal pedal pulses are 2+ bilaterally. Neuro: Alert and  oriented X 3. Moves all extremities spontaneously. Psych: Normal affect.  Labs    Chemistry Recent Labs  Lab 08/26/19 1825 08/27/19 0528 08/28/19 0514  NA 135 138 136  K 3.9 3.6 3.5  CL 106 102 101  CO2 20* 25 24  GLUCOSE 121* 136* 126*  BUN 15 15 18   CREATININE 1.08 1.10 1.06  CALCIUM 8.7* 8.8* 8.4*  GFRNONAA >60 >60 >60  GFRAA >60 >60 >60  ANIONGAP 9 11 11      Hematology Recent Labs  Lab 08/26/19 1825  WBC 7.9  RBC 4.44  HGB 13.0  HCT 40.7  MCV 91.7  MCH 29.3  MCHC 31.9  RDW 16.7*  PLT 192    Cardiac EnzymesNo results for input(s): TROPONINI in the last 168 hours. No results for input(s): TROPIPOC in the last 168 hours.   BNP Recent Labs  Lab 08/26/19 1825  BNP 1,615.0*     DDimer No results for input(s): DDIMER in the last 168 hours.   Radiology    DG Chest Portable 1 View  Result Date: 08/26/2019 CLINICAL DATA:  Shortness of breath and lower extremity swelling EXAM: PORTABLE CHEST 1 VIEW COMPARISON:  November 21, 2018 FINDINGS: There is mild cardiomegaly. Prominence of the central pulmonary vasculature is seen. There is a probable trace right pleural effusion. Aortic knob  calcifications are seen. No large airspace consolidation is noted. No acute osseous abnormality. IMPRESSION: Stable cardiomegaly and pulmonary vascular congestion. Electronically Signed   By: Prudencio Pair M.D.   On: 08/26/2019 19:12    Cardiac Studies   Echocardiogram: 08/27/2019 IMPRESSIONS    1. Left ventricular ejection fraction, by estimation, is 20 to 25%. The  left ventricle has severely decreased function. The left ventricle  demonstrates global hypokinesis. There is mild left ventricular  hypertrophy of the basal-septal segment. Left  ventricular diastolic parameters are consistent with Grade III diastolic  dysfunction (restrictive). Elevated left ventricular end-diastolic  pressure.  2. Right ventricular systolic function is severely reduced. The right  ventricular  size is mildly enlarged.  3. Left atrial size was mild to moderately dilated.  4. Right atrial size was mildly dilated.  5. The mitral valve is grossly normal. Mild mitral valve regurgitation.  6. The aortic valve is tricuspid. Aortic valve regurgitation is not  visualized.  7. The inferior vena cava is dilated in size with <50% respiratory  variability, suggesting right atrial pressure of 15 mmHg.   Patient Profile     59 y.o. male w/ PMH of CAD (s/p STEMI in 06/2014 with DES to LCx), mild ICM (EF 45-50% by cath, at 55-60% by echo at that time, EF 50-55% by echo in 09/2017), paroxysmal atrial fibrillation (previously on Eliquis), PAD (s/p R SFA stenting, right fem-pop bypass in 10/2017), HTN, HLD, prior TIA, COPD and tobacco use who is currently admitted for an acute CHF exacerbation. Off medications for 2+ years prior to admission. Found to have a new LBBB and newly reduced EF of 20-25%.   Assessment & Plan    1. 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 He self-discontinued all of his medications approximately 2 years ago after his wife passed away and was consuming a high-sodium diet prior to admission.  - BNP 1615. CXR shows cardiomegaly with pulmonary vascular congestion. Echo shows a reduced EF of 20-25% with global HK and Grade 3 DD. RV function severely reduced as well.  - currently receiving IV Lasix 40mg  BID with a recorded net output of -5.9L thus far. Weight actually recorded at 226 lbs on admission, at 219 lbs today. Unclear baseline as weight was 201 lbs in 06/2018 and he reports it was around 210 lbs on his home scales a few months prior to admission. Creatinine remains stable at 1.06. K+ at 3.5 and will order supplementation.  - we reviewed the indication for [facility] later this admission once respiratory status  improves (possibly tomorrow but likely Friday). Would ultimately plan for a repeat echo in 3 months and if EF remains reduced, would refer for CRT given LBBB.  - he is currently on Lisinopril. Will review with Dr. Bronson Ing about transitioning to Losartan so he can possibly be switched to [facility] prior to discharge or at follow-up if BP allows. Not on BB given concern for low-output. Also consider Arlyce Harman following cath if BP allows.    2. CAD - he is s/p STEMI in 06/2014 with DES to LCx. He has experienced worsening dyspnea on exertion as outlined above.  - HS Troponin values flat at 45 and 43. EKG does show a new LBBB and echo shows a newly reduced EF of 20-25% with global HK. Will require a cardiac catheterization later this  admission once respiratory status improves.  - he has been restarted on ASA 81mg  daily and Atorvastatin 40mg  daily (will need FLP and LFT's in 6-8 weeks following hospital discharge).   3. Paroxysmal Atrial Fibrillation - he denies any recent palpitations and is in NSR this admission. BB currently held given concern for low-output HF.  - he self-discontinued Eluquis approximately 2 years ago. Given his CHA2DS2-VASc Score of 5 with a 7.2 % stroke rate/year (CHF, HTN, Vascular, CVA (2)), would recommend restarting Eliquis 5mg  BID following his catheterization.   4. PAD - s/p R SFA stenting with right fem-pop bypass in 10/2017. He has been restarted on ASA 81mg  daily and Atorvastatin 40mg  daily. Will need to follow-up with Vascular Surgery as an outpatient.   5. HTN - BP has been well-controlled at 110/71 - 122/72 within the past 24 hours. Currently on Lisinopril 10mg  daily. Will consider switching to ARB as outlined above.   6. Tobacco Use - he was smoking 1 ppd prior to admission. Cessation has been advised.    For questions or updates, please contact [facility] Please consult [email or web address] for contact info under Cardiology/STEMI.    , PA-C 8:04 AM 08/28/2019 Pager: [phone]  The patient was seen and examined, and I agree with the history, physical exam, assessment and plan as documented above, with modifications made above and as noted below.  Shortness of breath has improved but he remains volume overloaded with orthopnea and bilateral leg edema. He had some episodes of chest discomfort overnight but he said "I think I just got used to these pains". They can last up to 30 seconds.  I spoke to him about echocardiogram results and plans to transfer to [facility] once he able to lie flat in order to undergo cardiac catheterization. He is in agreement with this plan.  Otherwise, continue current medical therapy with ASA, statin, ACEI, and IV Lasix.   , MD, [facility]  08/28/2019 8:57 AM

## 2019-08-29 LAB — MRSA PCR SCREENING: MRSA by PCR: POSITIVE — AB

## 2019-08-29 MED ORDER — MUPIROCIN 2 % EX OINT
1.0000 "application " | TOPICAL_OINTMENT | Freq: Two times a day (BID) | CUTANEOUS | Status: DC
  Administered 2019-08-30 – 2019-09-01 (×6): 1 via NASAL
  Filled 2019-08-29 (×3): qty 22

## 2019-08-29 MED ORDER — CHLORHEXIDINE GLUCONATE CLOTH 2 % EX PADS
6.0000 | MEDICATED_PAD | Freq: Every day | CUTANEOUS | Status: DC
  Administered 2019-08-30 – 2019-09-01 (×3): 6 via TOPICAL

## 2019-08-29 NOTE — Progress Notes (Signed)
Progress Note  Patient Name: Eugene Parker Date of Encounter: 08/29/2019  Primary Cardiologist: Dina Rich, MD   Subjective   Breathing is improving. He was almost able to lie flat last night. Denies chest pain. Primary complaints relate to bilateral leg pain and requests his meds.  Inpatient Medications    Scheduled Meds: . aspirin EC  81 mg Oral Daily  . atorvastatin  40 mg Oral q1800  . collagenase   Topical Daily  . furosemide  40 mg Intravenous BID  . lisinopril  10 mg Oral Daily  . sodium chloride flush  3 mL Intravenous Q12H   Continuous Infusions: . sodium chloride     PRN Meds: sodium chloride, acetaminophen, albuterol, HYDROcodone-acetaminophen, nitroGLYCERIN, ondansetron (ZOFRAN) IV, sodium chloride flush   Vital Signs    Vitals:   08/28/19 1016 08/28/19 2111 08/29/19 0443 08/29/19 0527  BP: 110/66 100/67  101/68  Pulse: 84 83  84  Resp:  19  19  Temp:  98 F (36.7 C)  98.5 F (36.9 C)  TempSrc:  Oral  Oral  SpO2:  99%  98%  Weight:   97.5 kg   Height:        Intake/Output Summary (Last 24 hours) at 08/29/2019 0838 Last data filed at 08/29/2019 0415 Gross per 24 hour  Intake 1080 ml  Output 2650 ml  Net -1570 ml    Last 3 Weights 08/29/2019 08/28/2019 08/26/2019  Weight (lbs) 214 lb 14.4 oz 219 lb 9.6 oz 216 lb  Weight (kg) 97.478 kg 99.61 kg 97.977 kg      Telemetry    NSR with occasional PVC's.  - Personally Reviewed  ECG   No new tracings.   Physical Exam   General: Well developed, well nourished Caucasian male appearing in no acute distress. Head: Normocephalic, atraumatic.  Neck: Supple without bruits, no JVD. Lungs:  CTAB. Heart: RRR, S1, S2, no S3, S4, or murmur; no rub. Abdomen: Soft, non-tender, non-distended. No obvious abdominal masses. Extremities: No clubbing or cyanosis, 2+ pitting edema bilaterally. Distal pedal pulses are 2+ bilaterally. Neuro: Alert and oriented X 3. Moves all extremities spontaneously.  Psych: Normal affect.  Labs    Chemistry Recent Labs  Lab 08/26/19 1825 08/27/19 0528 08/28/19 0514  NA 135 138 136  K 3.9 3.6 3.5  CL 106 102 101  CO2 20* 25 24  GLUCOSE 121* 136* 126*  BUN 15 15 18   CREATININE 1.08 1.10 1.06  CALCIUM 8.7* 8.8* 8.4*  GFRNONAA >60 >60 >60  GFRAA >60 >60 >60  ANIONGAP 9 11 11      Hematology Recent Labs  Lab 08/26/19 1825  WBC 7.9  RBC 4.44  HGB 13.0  HCT 40.7  MCV 91.7  MCH 29.3  MCHC 31.9  RDW 16.7*  PLT 192    Cardiac EnzymesNo results for input(s): TROPONINI in the last 168 hours. No results for input(s): TROPIPOC in the last 168 hours.   BNP Recent Labs  Lab 08/26/19 1825  BNP 1,615.0*     DDimer No results for input(s): DDIMER in the last 168 hours.   Radiology    DG Chest Portable 1 View  Result Date: 08/26/2019 CLINICAL DATA:  Shortness of breath and lower extremity swelling EXAM: PORTABLE CHEST 1 VIEW COMPARISON:  November 21, 2018 FINDINGS: There is mild cardiomegaly. Prominence of the central pulmonary vasculature is seen. There is a probable trace right pleural effusion. Aortic knob calcifications are seen. No large airspace consolidation is noted.  No acute osseous abnormality. IMPRESSION: Stable cardiomegaly and pulmonary vascular congestion. Electronically Signed   By: Prudencio Pair M.D.   On: 08/26/2019 19:12    Cardiac Studies   Echocardiogram: 08/27/2019 IMPRESSIONS    1. Left ventricular ejection fraction, by estimation, is 20 to 25%. The  left ventricle has severely decreased function. The left ventricle  demonstrates global hypokinesis. There is mild left ventricular  hypertrophy of the basal-septal segment. Left  ventricular diastolic parameters are consistent with Grade III diastolic  dysfunction (restrictive). Elevated left ventricular end-diastolic  pressure.  2. Right ventricular systolic function is severely reduced. The right  ventricular size is mildly enlarged.  3. Left atrial size was  mild to moderately dilated.  4. Right atrial size was mildly dilated.  5. The mitral valve is grossly normal. Mild mitral valve regurgitation.  6. The aortic valve is tricuspid. Aortic valve regurgitation is not  visualized.  7. The inferior vena cava is dilated in size with <50% respiratory  variability, suggesting right atrial pressure of 15 mmHg.   Patient Profile     59 y.o. male w/ PMH of CAD (s/p STEMI in 06/2014 with DES to LCx), mild ICM (EF 45-50% by cath, at 55-60% by echo at that time, EF 50-55% by echo in 09/2017), paroxysmal atrial fibrillation (previously on Eliquis), PAD (s/p R SFA stenting, right fem-pop bypass in 10/2017), HTN, HLD, prior TIA, COPD and tobacco use who is currently admitted for an acute CHF exacerbation. Off medications for 2+ years prior to admission. Found to have a new LBBB and newly reduced EF of 20-25%.   Assessment & Plan    1. 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 He self-discontinued all of his medications approximately 2 years ago after his wife passed away and was consuming a high-sodium diet prior to admission.  - BNP 1615. CXR shows cardiomegaly with pulmonary vascular congestion. Echo shows a reduced EF of 20-25% with global HK and Grade 3 DD. RV function severely reduced as well.  - currently receiving IV Lasix 40mg  BID with a recorded net output of -1.75 L in last 24 hrs. Weight actually recorded at 226 lbs on admission, at 215 lbs today. Unclear baseline as weight was 201 lbs in 06/2018 and he reports it was around 210 lbs on his home scales a few months prior to admission. Creatinine pending. - we reviewed the indication for [facility] later this admission once respiratory status improves (likely Friday). Would ultimately plan for a repeat echo in 3 months and if EF remains reduced,  would refer for CRT given LBBB.  - he is currently on Lisinopril. Not on BB given concern for low-output. Also consider Arlyce Harman following cath if BP allows.    2. CAD - he is s/p STEMI in 06/2014 with DES to LCx. He has experienced worsening dyspnea on exertion as outlined above.  - HS Troponin values flat at 45 and 43. EKG does show a new LBBB and echo shows a newly reduced EF of 20-25% with global HK. Will require a cardiac catheterization later this admission once no longer orthopneic. - he has been restarted on ASA 81mg  daily and Atorvastatin 40mg  daily (will need FLP and LFT's in 6-8 weeks following hospital discharge).   3. Paroxysmal Atrial Fibrillation - he denies any recent palpitations and is in NSR this  admission. BB currently held given concern for low-output HF.  - he self-discontinued Eluquis approximately 2 years ago. Given his CHA2DS2-VASc Score of 5 with a 7.2 % stroke rate/year (CHF, HTN, Vascular, CVA (2)), would recommend restarting Eliquis 5mg  BID following his catheterization.   4. PAD - s/p R SFA stenting with right fem-pop bypass in 10/2017. He has been restarted on ASA 81mg  daily and Atorvastatin 40mg  daily. Will need to follow-up with Vascular Surgery as an outpatient.   5. HTN - BP is normal. Currently on Lisinopril 10mg  daily.   6. Tobacco Use - he was smoking 1 ppd prior to admission. Cessation has been advised.    For questions or updates, please contact [facility] Please consult [email or web address] for contact info under Cardiology/STEMI.   Signed, 11/2017 , MD 8:38 AM 08/29/2019

## 2019-08-29 NOTE — Progress Notes (Signed)
PROGRESS NOTE  Lashaun Krapf II VOH:[id number] DOB: 17-Jul-1960 DOA: 08/26/2019 PCP: Hart Robinsons, DO  Brief History:  59 year old male with a history of coronary artery disease, peripheral vascular disease(s/p R SFA stenting, right fem-pop bypass in 10/2017), ischemic cardiomyopathy (EF 45-50% by cath, at 55-60% by echo at that time, EF 50-55% by echo in 09/2017), paroxysmal atrial fibrillation, hypertension, hyperlipidemia, COPD, tobacco abuse presenting with 2 to 3-day history of worsening shortness of breath and orthopnea type symptoms.  The patient had been complaining of increasing lower extremity edema, abdominal distention, shortness of breath for about 3 weeks.  He states that he quit taking all his medications and quit seeing his physicians approximately 2 years ago after his wife passed away.  He denies any fevers, chills, chest pain, nausea, vomiting, diarrhea, abdominal pain.  The patient was started on IV furosemide.  Cardiology was consulted to assist with management.  Repeat echocardiogram on 08/27/2019 showed EF 20-25% with grade 3 DD.  Assessment/Plan: Acute on chronic systolic and diastolic CHF -08/27/2019 echo EF 20-25%, grade 3 DD, severe decreased RV function -Continue IV furosemide -continue strict I's and O's -Appreciate cardiology follow-up -continue lisinopril and further titration meds by cards -plan is for [facility] transfer under cardiology service for heart cath and further eval/management -will need repeat echo in 3 months; if not improvement on his EF, will need ICD.  Paroxysmal atrial fibrillation -Restart apixaban when no further invasive procedures planned -Currently in sinus rhythm -CHADsVASC score 7  Peripheral arterial disease -s/p R SFA stenting with right fem-pop bypass in 10/2017.  -continue ASA -He will need to reestablish with Vascular Surgery as an outpatient.   Essential hypertension -Controlled and stable -Continue lisinopril and  current diuretics. -Low-sodium diet has been encouraged.  Coronary artery disease -No chest pain presently -Continue aspirin and statins -Per cardiology service plan is for heart cath evaluation.  Tobacco abuse -Tobacco cessation counseling has been provided. -Patient expressed ongoing quitting process (vaping currently).  Right lower extremity wound -Continue to follow recommendations by wound care service. -No signs of superimposed infection.  Hypokalemia -Continue repletion and supplementation. -Likely secondary to diuresis. -Magnesium within normal limits.    Disposition Plan: Patient From: Home D/C Place: Home- 2 Days or so. Barriers: Still with some fluid overload symptoms.  Requiring further evaluation and management per cardiology service including heart cath.  Family Communication:  No  Family at bedside  Consultants:  cardiology  Code Status:  FULL   DVT Prophylaxis:  apixaban   Procedures: As Listed in Progress Note Above  Antibiotics: None     Subjective: No fever, no chest pain, no nausea or vomiting.  Reports significant improvement in his breathing and expressing very mild orthopnea only.  Objective: Vitals:   08/28/19 2111 08/29/19 0443 08/29/19 0527 08/29/19 1237  BP: 100/67  101/68 107/61  Pulse: 83  84   Resp: 19  19   Temp: 98 F (36.7 C)  98.5 F (36.9 C)   TempSrc: Oral  Oral   SpO2: 99%  98%   Weight:  97.5 kg    Height:        Intake/Output Summary (Last 24 hours) at 08/29/2019 1543 Last data filed at 08/29/2019 1050 Gross per 24 hour  Intake 243 ml  Output 2900 ml  Net -2657 ml   Weight change: -2.132 kg  Exam: General exam: Alert, awake, oriented x 3; reports no chest pain currently.  Good  oxygen saturation on room air and expressing very mild orthopnea.  Good urine output reported. Respiratory system: No frank crackles, improved air movement bilaterally.  No using accessory muscles.  Normal respiratory  effort. Cardiovascular system: Rate controlled, no rubs, no gallops, no JVD. Gastrointestinal system: Abdomen is nondistended, soft and nontender. No organomegaly or masses felt. Normal bowel sounds heard. Central nervous system: Alert and oriented. No focal neurological deficits. Extremities: Trace edema bilaterally; no cyanosis or clubbing. Skin: No petechiae; right foot with chronic wound and no signs of superimposed infection (see below for pictures). Psychiatry: Judgement and insight appear normal. Mood & affect appropriate.            Data Reviewed: I have personally reviewed following labs and imaging studies  Basic Metabolic Panel: Recent Labs  Lab 08/26/19 1825 08/27/19 0528 08/28/19 0514  NA 135 138 136  K 3.9 3.6 3.5  CL 106 102 101  CO2 20* 25 24  GLUCOSE 121* 136* 126*  BUN 15 15 18   CREATININE 1.08 1.10 1.06  CALCIUM 8.7* 8.8* 8.4*  MG  --   --  1.9   CBC: Recent Labs  Lab 08/26/19 1825  WBC 7.9  NEUTROABS 5.0  HGB 13.0  HCT 40.7  MCV 91.7  PLT 192    Recent Labs  Lab 08/27/19 0447  GLUCAP 126*   Urine analysis:    Component Value Date/Time   COLORURINE YELLOW 11/01/2017 1826   APPEARANCEUR CLEAR 11/01/2017 1826   LABSPEC 1.015 11/01/2017 1826   PHURINE 6.0 11/01/2017 1826   GLUCOSEU NEGATIVE 11/01/2017 1826   HGBUR SMALL (A) 11/01/2017 1826   BILIRUBINUR NEGATIVE 11/01/2017 1826   KETONESUR NEGATIVE 11/01/2017 1826   PROTEINUR NEGATIVE 11/01/2017 1826   NITRITE NEGATIVE 11/01/2017 1826   LEUKOCYTESUR NEGATIVE 11/01/2017 1826   Sepsis Labs: @LABRCNTIP (procalcitonin:4,lacticidven:4) ) Recent Results (from the past 240 hour(s))  SARS Coronavirus 2 by RT PCR (hospital order, performed in [place] hospital lab) Nasopharyngeal Nasopharyngeal Swab     Status: None   Collection Time: 08/26/19  7:40 PM   Specimen: Nasopharyngeal Swab  Result Value Ref Range Status   SARS Coronavirus 2 NEGATIVE NEGATIVE Final    Comment:  (NOTE) SARS-CoV-2 target nucleic acids are NOT DETECTED. The SARS-CoV-2 RNA is generally detectable in upper and lower respiratory specimens during the acute phase of infection. The lowest concentration of SARS-CoV-2 viral copies this assay can detect is 250 copies / mL. A negative result does not preclude SARS-CoV-2 infection and should not be used as the sole basis for treatment or other patient management decisions.  A negative result may occur with improper specimen collection / handling, submission of specimen other than nasopharyngeal swab, presence of viral mutation(s) within the areas targeted by this assay, and inadequate number of viral copies (<250 copies / mL). A negative result must be combined with clinical observations, patient history, and epidemiological information. Fact Sheet for Patients:   StrictlyIdeas.no Fact Sheet for Healthcare Providers: BankingDealers.co.za This test is not yet approved or cleared  by the [place] FDA and has been authorized for detection and/or diagnosis of SARS-CoV-2 by FDA under an Emergency Use Authorization (EUA).  This EUA will remain in effect (meaning this test can be used) for the duration of the COVID-19 declaration under Section 564(b)(1) of the Act, 21 U.S.C. section 360bbb-3(b)(1), unless the authorization is terminated or revoked sooner. Performed at [facility], [address] [place], [place] [address]      Scheduled Meds: . aspirin  EC  81 mg Oral Daily  . atorvastatin  40 mg Oral q1800  . collagenase   Topical Daily  . furosemide  40 mg Intravenous BID  . lisinopril  10 mg Oral Daily  . sodium chloride flush  3 mL Intravenous Q12H   Continuous Infusions: . sodium chloride      Procedures/Studies: DG Chest Portable 1 View  Result Date: 08/26/2019 CLINICAL DATA:  Shortness of breath and lower extremity swelling EXAM: PORTABLE CHEST 1 VIEW COMPARISON:  November 21, 2018 FINDINGS: There is mild cardiomegaly. Prominence of the central pulmonary vasculature is seen. There is a probable trace right pleural effusion. Aortic knob calcifications are seen. No large airspace consolidation is noted. No acute osseous abnormality. IMPRESSION: Stable cardiomegaly and pulmonary vascular congestion. Electronically Signed   By: Jonna Clark M.D.   On: 08/26/2019 19:12   ECHOCARDIOGRAM COMPLETE  Result Date: 08/27/2019    ECHOCARDIOGRAM REPORT   Patient Name:   Lavaris Sexson II Date of Exam: 08/27/2019 Medical Rec #:  [id number]              Height:       71.0 in Accession #:    [id number]             Weight:       216.0 lb Date of Birth:  [birth year]               BSA:          2.179 m Patient Age:    59 years               BP:           128/89 mmHg Patient Gender: M                      HR:           90 bpm. [facility] Procedure: 2D Echo Indications:    Congestive Heart Failure 428.0 / I50.9  History:        Patient has prior history of Echocardiogram examinations, most                 recent 10/06/2017. CHF, Previous Myocardial Infarction; Risk                 Factors:Current Smoker, Dyslipidemia and Hypertension.  Sonographer:    Jeryl Columbia RDCS (AE) Referring Phys: [id number] [facility] Z KHAN IMPRESSIONS  1. Left ventricular ejection fraction, by estimation, is 20 to 25%. The left ventricle has severely decreased function. The left ventricle demonstrates global hypokinesis. There is mild left ventricular hypertrophy of the basal-septal segment. Left ventricular diastolic parameters are consistent with Grade III diastolic dysfunction (restrictive). Elevated left ventricular end-diastolic pressure.  2. Right ventricular systolic function is severely reduced. The right ventricular size is mildly enlarged.  3. Left atrial size was mild to moderately dilated.  4. Right atrial size was mildly dilated.  5. The mitral valve is grossly normal. Mild mitral valve regurgitation.   6. The aortic valve is tricuspid. Aortic valve regurgitation is not visualized.  7. The inferior vena cava is dilated in size with <50% respiratory variability, suggesting right atrial pressure of 15 mmHg. FINDINGS  Left Ventricle: Left ventricular ejection fraction, by estimation, is 20 to 25%. The left ventricle has severely decreased function. The left ventricle demonstrates global hypokinesis. The left ventricular internal cavity size was normal in size. There is mild left ventricular  hypertrophy of the basal-septal segment. Abnormal (paradoxical) septal motion, consistent with left bundle branch block. Left ventricular diastolic parameters are consistent with Grade III diastolic dysfunction (restrictive). Elevated left ventricular end-diastolic pressure.  LV Wall Scoring: The posterior wall is hypokinetic. Right Ventricle: The right ventricular size is mildly enlarged. No increase in right ventricular wall thickness. Right ventricular systolic function is severely reduced. Left Atrium: Left atrial size was mild to moderately dilated. Right Atrium: Right atrial size was mildly dilated. Pericardium: There is no evidence of pericardial effusion. Mitral Valve: The mitral valve is grossly normal. Mild mitral annular calcification. Mild mitral valve regurgitation. Tricuspid Valve: The tricuspid valve is grossly normal. Tricuspid valve regurgitation is trivial. Aortic Valve: The aortic valve is tricuspid. . There is mild thickening of the aortic valve. Aortic valve regurgitation is not visualized. There is mild thickening of the aortic valve. Pulmonic Valve: The pulmonic valve was grossly normal. Pulmonic valve regurgitation is not visualized. Aorta: The aortic root is normal in size and structure. Venous: The inferior vena cava is dilated in size with less than 50% respiratory variability, suggesting right atrial pressure of 15 mmHg. IAS/Shunts: No atrial level shunt detected by color flow Doppler. Prentice Docker  MD Electronically signed by Prentice Docker MD Signature Date/Time: 08/27/2019/10:35:05 AM    Final     Vassie Loll, MD  [facility]  If 7PM-7AM, please contact night-coverage [email or web address] Password [facility] 08/29/2019, 3:43 PM   LOS: 2 days

## 2019-08-29 NOTE — Progress Notes (Signed)
Pt assigned to bed 2C07-01 at [facility]. Attempted to call report to [phone] but no one answered the phone.

## 2019-08-30 ENCOUNTER — Encounter (HOSPITAL_COMMUNITY)
Admission: EM | Disposition: A | Discharge status: Home / Self Care | Payer: Self-pay | Source: Home / Self Care | Attending: Internal Medicine

## 2019-08-30 DIAGNOSIS — I5043 Acute on chronic combined systolic (congestive) and diastolic (congestive) heart failure: Secondary | ICD-10-CM

## 2019-08-30 DIAGNOSIS — I2510 Atherosclerotic heart disease of native coronary artery without angina pectoris: Secondary | ICD-10-CM

## 2019-08-30 DIAGNOSIS — I480 Paroxysmal atrial fibrillation: Secondary | ICD-10-CM

## 2019-08-30 DIAGNOSIS — I5021 Acute systolic (congestive) heart failure: Secondary | ICD-10-CM

## 2019-08-30 DIAGNOSIS — Z720 Tobacco use: Secondary | ICD-10-CM

## 2019-08-30 HISTORY — PX: CORONARY STENT INTERVENTION: CATH118234

## 2019-08-30 HISTORY — PX: INTRAVASCULAR ULTRASOUND/IVUS: CATH118244

## 2019-08-30 HISTORY — PX: RIGHT/LEFT HEART CATH AND CORONARY ANGIOGRAPHY: CATH118266

## 2019-08-30 LAB — POCT I-STAT EG7
Acid-Base Excess: 1 mmol/L (ref 0.0–2.0)
Bicarbonate: 26.3 mmol/L (ref 20.0–28.0)
Calcium, Ion: 1.12 mmol/L — ABNORMAL LOW (ref 1.15–1.40)
HCT: 40 % (ref 39.0–52.0)
Hemoglobin: 13.6 g/dL (ref 13.0–17.0)
O2 Saturation: 62 %
Potassium: 3.7 mmol/L (ref 3.5–5.1)
Sodium: 139 mmol/L (ref 135–145)
TCO2: 28 mmol/L (ref 22–32)
pCO2, Ven: 43.4 mmHg — ABNORMAL LOW (ref 44.0–60.0)
pH, Ven: 7.391 (ref 7.250–7.430)
pO2, Ven: 33 mmHg (ref 32.0–45.0)

## 2019-08-30 LAB — CBC
HCT: 38.2 % — ABNORMAL LOW (ref 39.0–52.0)
Hemoglobin: 12.1 g/dL — ABNORMAL LOW (ref 13.0–17.0)
MCH: 28.7 pg (ref 26.0–34.0)
MCHC: 31.7 g/dL (ref 30.0–36.0)
MCV: 90.7 fL (ref 80.0–100.0)
Platelets: 170 10*3/uL (ref 150–400)
RBC: 4.21 MIL/uL — ABNORMAL LOW (ref 4.22–5.81)
RDW: 16.4 % — ABNORMAL HIGH (ref 11.5–15.5)
WBC: 6.7 10*3/uL (ref 4.0–10.5)
nRBC: 0 % (ref 0.0–0.2)

## 2019-08-30 LAB — POCT I-STAT 7, (LYTES, BLD GAS, ICA,H+H)
Acid-Base Excess: 0 mmol/L (ref 0.0–2.0)
Bicarbonate: 23.9 mmol/L (ref 20.0–28.0)
Calcium, Ion: 1.14 mmol/L — ABNORMAL LOW (ref 1.15–1.40)
HCT: 40 % (ref 39.0–52.0)
Hemoglobin: 13.6 g/dL (ref 13.0–17.0)
O2 Saturation: 98 %
Potassium: 3.7 mmol/L (ref 3.5–5.1)
Sodium: 139 mmol/L (ref 135–145)
TCO2: 25 mmol/L (ref 22–32)
pCO2 arterial: 37 mmHg (ref 32.0–48.0)
pH, Arterial: 7.419 (ref 7.350–7.450)
pO2, Arterial: 98 mmHg (ref 83.0–108.0)

## 2019-08-30 LAB — BASIC METABOLIC PANEL
Anion gap: 11 (ref 5–15)
BUN: 17 mg/dL (ref 8–23)
CO2: 23 mmol/L (ref 22–32)
Calcium: 8.5 mg/dL — ABNORMAL LOW (ref 8.9–10.3)
Chloride: 102 mmol/L (ref 98–111)
Creatinine, Ser: 1.16 mg/dL (ref 0.61–1.24)
GFR calc Af Amer: >60 mL/min (ref >60)
GFR calc non Af Amer: >60 mL/min (ref >60)
Glucose, Bld: 111 mg/dL — ABNORMAL HIGH (ref 70–99)
Potassium: 3.6 mmol/L (ref 3.5–5.1)
Sodium: 136 mmol/L (ref 135–145)

## 2019-08-30 LAB — POCT ACTIVATED CLOTTING TIME
Activated Clotting Time: 263 seconds
Activated Clotting Time: 499 seconds

## 2019-08-30 SURGERY — RIGHT/LEFT HEART CATH AND CORONARY ANGIOGRAPHY
Anesthesia: LOCAL

## 2019-08-30 MED ORDER — MIDAZOLAM HCL 2 MG/2ML IJ SOLN
INTRAMUSCULAR | Status: AC
  Filled 2019-08-30: qty 2

## 2019-08-30 MED ORDER — TICAGRELOR 90 MG PO TABS
90.0000 mg | ORAL_TABLET | Freq: Two times a day (BID) | ORAL | Status: AC
  Administered 2019-08-31 (×2): 90 mg via ORAL
  Filled 2019-08-30 (×3): qty 1

## 2019-08-30 MED ORDER — NITROGLYCERIN 1 MG/10 ML FOR IR/CATH LAB
INTRA_ARTERIAL | Status: AC
  Filled 2019-08-30: qty 10

## 2019-08-30 MED ORDER — ACETAMINOPHEN 325 MG PO TABS
650.0000 mg | ORAL_TABLET | ORAL | Status: DC | PRN
  Administered 2019-08-30: 650 mg via ORAL
  Filled 2019-08-30: qty 2

## 2019-08-30 MED ORDER — TICAGRELOR 90 MG PO TABS
ORAL_TABLET | ORAL | Status: DC | PRN
  Administered 2019-08-30: 180 mg via ORAL

## 2019-08-30 MED ORDER — SODIUM CHLORIDE 0.9 % IV SOLN: 250.0000 mL | INTRAVENOUS | Status: DC | PRN

## 2019-08-30 MED ORDER — METOPROLOL TARTRATE 12.5 MG HALF TABLET
12.5000 mg | ORAL_TABLET | Freq: Two times a day (BID) | ORAL | Status: AC
  Administered 2019-08-30 – 2019-08-31 (×3): 12.5 mg via ORAL
  Filled 2019-08-30 (×3): qty 1

## 2019-08-30 MED ORDER — LIDOCAINE HCL (PF) 1 % IJ SOLN
INTRAMUSCULAR | Status: AC
  Filled 2019-08-30: qty 30

## 2019-08-30 MED ORDER — NITROGLYCERIN 1 MG/10 ML FOR IR/CATH LAB
INTRA_ARTERIAL | Status: DC | PRN
  Administered 2019-08-30 (×2): 200 ug via INTRACORONARY

## 2019-08-30 MED ORDER — LABETALOL HCL 5 MG/ML IV SOLN: 10.0000 mg | INTRAVENOUS | Status: DC | PRN

## 2019-08-30 MED ORDER — POTASSIUM CHLORIDE ER 10 MEQ PO TBCR
40.0000 meq | EXTENDED_RELEASE_TABLET | Freq: Every day | ORAL | Status: DC
  Administered 2019-08-31 – 2019-09-01 (×2): 40 meq via ORAL
  Filled 2019-08-30 (×3): qty 4

## 2019-08-30 MED ORDER — HEPARIN (PORCINE) IN NACL 1000-0.9 UT/500ML-% IV SOLN
INTRAVENOUS | Status: AC
  Filled 2019-08-30: qty 1000

## 2019-08-30 MED ORDER — FENTANYL CITRATE (PF) 100 MCG/2ML IJ SOLN
INTRAMUSCULAR | Status: DC | PRN
  Administered 2019-08-30 (×2): 25 ug via INTRAVENOUS

## 2019-08-30 MED ORDER — MIDAZOLAM HCL 2 MG/2ML IJ SOLN
INTRAMUSCULAR | Status: DC | PRN
  Administered 2019-08-30: 1 mg via INTRAVENOUS

## 2019-08-30 MED ORDER — SODIUM CHLORIDE 0.9 % IV SOLN
INTRAVENOUS | Status: DC
Start: 2019-08-31 — End: 2019-08-30

## 2019-08-30 MED ORDER — ASPIRIN 81 MG PO CHEW
81.0000 mg | CHEWABLE_TABLET | Freq: Every day | ORAL | Status: DC
  Administered 2019-08-30 – 2019-09-01 (×3): 81 mg via ORAL
  Filled 2019-08-30 (×3): qty 1

## 2019-08-30 MED ORDER — ONDANSETRON HCL 4 MG/2ML IJ SOLN: 4.0000 mg | Freq: Four times a day (QID) | INTRAMUSCULAR | Status: DC | PRN

## 2019-08-30 MED ORDER — LIDOCAINE HCL (PF) 1 % IJ SOLN
INTRAMUSCULAR | Status: DC | PRN
  Administered 2019-08-30 (×2): 2 mL

## 2019-08-30 MED ORDER — HEPARIN SODIUM (PORCINE) 1000 UNIT/ML IJ SOLN
INTRAMUSCULAR | Status: DC | PRN
  Administered 2019-08-30: 4000 [IU] via INTRAVENOUS
  Administered 2019-08-30 (×2): 5000 [IU] via INTRAVENOUS

## 2019-08-30 MED ORDER — SODIUM CHLORIDE 0.9% FLUSH
3.0000 mL | Freq: Two times a day (BID) | INTRAVENOUS | Status: DC
  Administered 2019-08-31 – 2019-09-01 (×3): 3 mL via INTRAVENOUS

## 2019-08-30 MED ORDER — IOHEXOL 350 MG/ML SOLN
INTRAVENOUS | Status: DC | PRN
  Administered 2019-08-30: 130 mL

## 2019-08-30 MED ORDER — SODIUM CHLORIDE 0.9% FLUSH: 3.0000 mL | INTRAVENOUS | Status: DC | PRN

## 2019-08-30 MED ORDER — VERAPAMIL HCL 2.5 MG/ML IV SOLN
INTRAVENOUS | Status: AC
  Filled 2019-08-30: qty 2

## 2019-08-30 MED ORDER — FUROSEMIDE 10 MG/ML IJ SOLN
40.0000 mg | Freq: Once | INTRAMUSCULAR | Status: AC
  Administered 2019-08-30: 40 mg via INTRAVENOUS
  Filled 2019-08-30: qty 4

## 2019-08-30 MED ORDER — HEPARIN SODIUM (PORCINE) 1000 UNIT/ML IJ SOLN
INTRAMUSCULAR | Status: AC
  Filled 2019-08-30: qty 1

## 2019-08-30 MED ORDER — SODIUM CHLORIDE 0.9 % IV SOLN: INTRAVENOUS | Status: DC

## 2019-08-30 MED ORDER — FENTANYL CITRATE (PF) 100 MCG/2ML IJ SOLN
INTRAMUSCULAR | Status: AC
  Filled 2019-08-30: qty 2

## 2019-08-30 MED ORDER — HYDRALAZINE HCL 20 MG/ML IJ SOLN: 10.0000 mg | INTRAMUSCULAR | Status: DC | PRN

## 2019-08-30 MED ORDER — ASPIRIN 81 MG PO CHEW
81.0000 mg | CHEWABLE_TABLET | ORAL | Status: AC
  Administered 2019-08-30: 81 mg via ORAL
  Filled 2019-08-30: qty 1

## 2019-08-30 MED ORDER — SODIUM CHLORIDE 0.9% FLUSH
3.0000 mL | Freq: Two times a day (BID) | INTRAVENOUS | Status: DC
  Administered 2019-08-30: 3 mL via INTRAVENOUS

## 2019-08-30 MED ORDER — HEPARIN (PORCINE) IN NACL 1000-0.9 UT/500ML-% IV SOLN
INTRAVENOUS | Status: DC | PRN
  Administered 2019-08-30 (×2): 500 mL

## 2019-08-30 MED ORDER — ASPIRIN 81 MG PO CHEW
81.0000 mg | CHEWABLE_TABLET | ORAL | Status: DC
Start: 2019-08-31 — End: 2019-08-30

## 2019-08-30 MED ORDER — VERAPAMIL HCL 2.5 MG/ML IV SOLN
INTRAVENOUS | Status: DC | PRN
  Administered 2019-08-30: 10 mL via INTRA_ARTERIAL

## 2019-08-30 SURGICAL SUPPLY — 26 items
BAG SNAP BAND KOVER 36X36 (MISCELLANEOUS) ×1 IMPLANT
BALLN SAPPHIRE 2.0X12 (BALLOONS) ×2
BALLN SAPPHIRE 2.5X20 (BALLOONS) ×2
BALLN SAPPHIRE ~~LOC~~ 3.5X15 (BALLOONS) ×1 IMPLANT
BALLOON SAPPHIRE 2.0X12 (BALLOONS) IMPLANT
BALLOON SAPPHIRE 2.5X20 (BALLOONS) IMPLANT
CATH 5FR JL3.5 JR4 ANG PIG MP (CATHETERS) ×1 IMPLANT
CATH BALLN WEDGE 5F 110CM (CATHETERS) ×1 IMPLANT
CATH LAUNCHER 6FR EBU3.5 (CATHETERS) ×1 IMPLANT
CATH OPTICROSS HD (CATHETERS) ×1 IMPLANT
DEVICE RAD COMP TR BAND LRG (VASCULAR PRODUCTS) ×1 IMPLANT
GLIDESHEATH SLEND SS 6F .021 (SHEATH) ×1 IMPLANT
GUIDEWIRE INQWIRE 1.5J.035X260 (WIRE) IMPLANT
INQWIRE 1.5J .035X260CM (WIRE) ×2
KIT ENCORE 26 ADVANTAGE (KITS) ×1 IMPLANT
KIT HEART LEFT (KITS) ×2 IMPLANT
KIT HEMO VALVE WATCHDOG (MISCELLANEOUS) ×1 IMPLANT
PACK CARDIAC CATHETERIZATION (CUSTOM PROCEDURE TRAY) ×2 IMPLANT
SHEATH GLIDE SLENDER 4/5FR (SHEATH) ×1 IMPLANT
SLED PULL BACK IVUS (MISCELLANEOUS) ×1 IMPLANT
STENT SYNERGY XD 3.0X28 (Permanent Stent) IMPLANT
SYNERGY XD 3.0X28 (Permanent Stent) ×2 IMPLANT
TRANSDUCER W/STOPCOCK (MISCELLANEOUS) ×2 IMPLANT
TUBING CIL FLEX 10 FLL-RA (TUBING) ×2 IMPLANT
WIRE ASAHI PROWATER 180CM (WIRE) ×1 IMPLANT
WIRE FIGHTER CROSSING 190CM (WIRE) ×1 IMPLANT

## 2019-08-30 NOTE — Progress Notes (Signed)
[facility] here to [facility]. Report given to [facility]. Patient leaving floor at current time.

## 2019-08-30 NOTE — Progress Notes (Signed)
Notified by primary nurse of AF w/ RVR postprocedurally from [facility] today.  Underwent mLAD DES, D1 CTO balloon angioplasty, RHC w/ CI 2.25 & RA 14, PCWP 24, mean PA 31.  Feeling well on bedside assessment without complain and wondering if he can go home tomorrow.  Unaware of AF.  BP 114/90 (BP Location: Left Arm)   Pulse 95   Temp (!) 97.5 F (36.4 C) (Oral)   Resp (!) 21   Ht 5\' 11"  (1.803 m)   Wt 98.6 kg   SpO2 99%   BMI 30.32 kg/m   Irregularly irregular w/ o murmur Trace peripheral edema JVP elevated 12 cm H20. Lungs clear bilaterally.  EKG known LBBB w/ irregularly irregular tachycardia.  Favor course AF.  Telemetry reviewed.  AF with variable rates, occasional RVR to 140s.  - Start metoprolol tartrate 12.5 mg BID and titrate as able for rate control.  - Avoiding rhythm control strategy w/ recent apixaban discontinuation. - Resume apixaban 5 mg BID per Dr. ; he is amenable. Day team can review dropping aspirin with additional DOAC and ticagrelor. - Furosemide 40 mg x1 IV w/ KCL 40 mEq  C. Jacques Navy MD Cardiology Fellow

## 2019-08-30 NOTE — Progress Notes (Signed)
Patient went into Afib RVR, HR 108. On call MD was notified. MD said he will come see the patient. Patient's EKG strip in patient's chart. Will continue to monitor.

## 2019-08-30 NOTE — Interval H&P Note (Signed)
Cath Lab Visit (complete for each Cath Lab visit)  Clinical Evaluation Leading to the Procedure:   ACS: Yes.    Non-ACS:    Anginal Classification: CCS IV  Anti-ischemic medical therapy: Minimal Therapy (1 class of medications)  Non-Invasive Test Results: High-risk stress test findings: cardiac mortality >3%/year  Prior CABG: No previous CABG  Low EF, change from 2019    History and Physical Interval Note:  08/30/2019 3:38 PM  Eugene Parker  has presented today for surgery, with the diagnosis of cardiomyopathy.  The various methods of treatment have been discussed with the patient and family. After consideration of risks, benefits and other options for treatment, the patient has consented to  Procedure(s): RIGHT/LEFT HEART CATH AND CORONARY ANGIOGRAPHY (N/A) as a surgical intervention.  The patient's history has been reviewed, patient examined, no change in status, stable for surgery.  I have reviewed the patient's chart and labs.  Questions were answered to the patient's satisfaction.     Lance Muss

## 2019-08-30 NOTE — Progress Notes (Signed)
TR band removed. No swelling, bleeding or hematoma. Gauze and Tegaderm in place.Patient advised not to use the arm tonight.

## 2019-08-30 NOTE — H&P (View-Only) (Signed)
 Progress Note  Patient Name: Eugene Parker Date of Encounter: 08/30/2019  Primary Cardiologist: Branch, Jonathan, MD   Subjective   Feeling much better with diuresis. Presenting complaint was SOB.   Inpatient Medications    Scheduled Meds: . aspirin EC  81 mg Oral Daily  . atorvastatin  40 mg Oral q1800  . Chlorhexidine Gluconate Cloth  6 each Topical Q0600  . collagenase   Topical Daily  . furosemide  40 mg Intravenous BID  . lisinopril  10 mg Oral Daily  . mupirocin ointment  1 application Nasal BID  . sodium chloride flush  3 mL Intravenous Q12H  . sodium chloride flush  3 mL Intravenous Q12H   Continuous Infusions: . sodium chloride    . sodium chloride    . sodium chloride 10 mL/hr at 08/30/19 0618   PRN Meds: sodium chloride, sodium chloride, acetaminophen, albuterol, HYDROcodone-acetaminophen, nitroGLYCERIN, ondansetron (ZOFRAN) IV, sodium chloride flush, sodium chloride flush   Vital Signs    Vitals:   08/29/19 2224 08/30/19 0332 08/30/19 0440 08/30/19 0710  BP: 101/68 113/65 113/78 111/70  Pulse: 83 86  81  Resp: 16 16 19 14  Temp: 99.1 F (37.3 C) 98.3 F (36.8 C) 97.9 F (36.6 C) 97.9 F (36.6 C)  TempSrc: Oral Oral Oral Oral  SpO2: 99% 100% 100% 97%  Weight:   98.6 kg   Height:   5' 11" (1.803 m)     Intake/Output Summary (Last 24 hours) at 08/30/2019 0859 Last data filed at 08/30/2019 0700 Gross per 24 hour  Intake 3 ml  Output 3400 ml  Net -3397 ml   Last 3 Weights 08/30/2019 08/29/2019 08/28/2019  Weight (lbs) 217 lb 6 oz 214 lb 14.4 oz 219 lb 9.6 oz  Weight (kg) 98.6 kg 97.478 kg 99.61 kg      Telemetry    SR, IVCD- Personally Reviewed  ECG    SR IVCD - Personally Reviewed  Physical Exam   GEN: No acute distress.   Neck: No JVD Cardiac: RRR, no murmurs, rubs, or gallops.  Respiratory: Clear to auscultation bilaterally. GI: Soft, nontender, non-distended  MS: 1+ edema; No deformity. bialteral excoriations. Neuro:   Nonfocal  Psych: Normal affect   Labs    High Sensitivity Troponin:   Recent Labs  Lab 08/26/19 1825 08/26/19 2034  TROPONINIHS 45* 43*      Chemistry Recent Labs  Lab 08/27/19 0528 08/28/19 0514 08/30/19 0505  NA 138 136 136  K 3.6 3.5 3.6  CL 102 101 102  CO2 25 24 23  GLUCOSE 136* 126* 111*  BUN 15 18 17  CREATININE 1.10 1.06 1.16  CALCIUM 8.8* 8.4* 8.5*  GFRNONAA >60 >60 >60  GFRAA >60 >60 >60  ANIONGAP 11 11 11     Hematology Recent Labs  Lab 08/26/19 1825 08/30/19 0505  WBC 7.9 6.7  RBC 4.44 4.21*  HGB 13.0 12.1*  HCT 40.7 38.2*  MCV 91.7 90.7  MCH 29.3 28.7  MCHC 31.9 31.7  RDW 16.7* 16.4*  PLT 192 170    BNP Recent Labs  Lab 08/26/19 1825  BNP 1,615.0*     DDimer No results for input(s): DDIMER in the last 168 hours.   Radiology    No results found.  Cardiac Studies   1. Left ventricular ejection fraction, by estimation, is 20 to 25%. The  left ventricle has severely decreased function. The left ventricle  demonstrates global hypokinesis. There is mild left ventricular    hypertrophy of the basal-septal segment. Left  ventricular diastolic parameters are consistent with Grade III diastolic  dysfunction (restrictive). Elevated left ventricular end-diastolic  pressure.  2. Right ventricular systolic function is severely reduced. The right  ventricular size is mildly enlarged.  3. Left atrial size was mild to moderately dilated.  4. Right atrial size was mildly dilated.  5. The mitral valve is grossly normal. Mild mitral valve regurgitation.  6. The aortic valve is tricuspid. Aortic valve regurgitation is not  visualized.  7. The inferior vena cava is dilated in size with <50% respiratory  variability, suggesting right atrial pressure of 15 mmHg.   Patient Profile     59 y.o. male w/ PMH of CAD (s/p STEMI in 03/2016with DES to LCx), mild ICM (EF 45-50% by cath, at55-60% by echo at that time, EF 50-55% by echo in 09/2017),  paroxysmal atrial fibrillation (previously on Eliquis),PAD (s/p R SFA stenting, right fem-pop bypass in 10/2017), HTN, HLD, prior TIA, COPD and tobacco use who is currently admitted for an acute CHF exacerbation. Off medications for 2+ years prior to admission. Found to have a new LBBB and newly reduced EF of 20-25%.   Assessment & Plan    1. 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 INFORMED CONSENT: I have reviewed the risks, indications, and alternatives to cardiac catheterization, possible angioplasty, and stenting with the patient. Risks include but are not limited to bleeding, infection, vascular injury, stroke, myocardial infection, arrhythmia, kidney injury, radiation-related injury in the case of prolonged fluoroscopy use, emergency cardiac surgery, and death. The patient understands the risks of serious complication is 1-2 in 1000 with diagnostic cardiac cath and 1-2% or less with angioplasty/stenting.    2. CAD - he is s/p STEMI in 03/2016with DES to LCx. He has experienced worsening dyspnea on exertion as outlined above.  - HS Troponin values flat at 45 and 43. EKG does show a new LBBB and echo shows a newly reduced EF of 20-25% with global HK. Will require a cardiac catheterization later this admission once no longer orthopneic. - he has been restarted on ASA 81mg  daily and Atorvastatin 40mg  daily (will need FLP and LFT's in 6-8 weeks following hospital discharge).   3. Paroxysmal Atrial Fibrillation - he denies any recent palpitations and is in NSR this admission. BB currently held given concern for low-output HF.  - he self-discontinued Eluquis approximately 2 years ago. Given his CHA2DS2-VASc Score of 5 with a 7.2 % stroke rate/year (CHF, HTN, Vascular, CVA (2)), would recommend restarting Eliquis 5mg  BID following his catheterization.   4.PAD -s/p R SFA stentingwithright fem-pop  bypass in 10/2017. He has been restarted on ASA 81mg  daily and Atorvastatin 40mg  daily. Will need to follow-up with Vascular Surgery as an outpatient.   5. HTN - BP is normal. Currently on Lisinopril 10mg  daily.   6. Tobacco Use - he was smoking 1 ppd prior to admission. Cessation has been advised.    For questions or updates, please contact [facility] Please consult [email or web address] for contact info under        Signed, , MD  08/30/2019, 8:59 AM

## 2019-08-30 NOTE — Progress Notes (Signed)
Progress Note  Patient Name: Eugene Parker Date of Encounter: 08/30/2019  Primary Cardiologist: Carlyle Dolly, MD   Subjective   Feeling much better with diuresis. Presenting complaint was SOB.   Inpatient Medications    Scheduled Meds: . aspirin EC  81 mg Oral Daily  . atorvastatin  40 mg Oral q1800  . Chlorhexidine Gluconate Cloth  6 each Topical Q0600  . collagenase   Topical Daily  . furosemide  40 mg Intravenous BID  . lisinopril  10 mg Oral Daily  . mupirocin ointment  1 application Nasal BID  . sodium chloride flush  3 mL Intravenous Q12H  . sodium chloride flush  3 mL Intravenous Q12H   Continuous Infusions: . sodium chloride    . sodium chloride    . sodium chloride 10 mL/hr at 08/30/19 0618   PRN Meds: sodium chloride, sodium chloride, acetaminophen, albuterol, HYDROcodone-acetaminophen, nitroGLYCERIN, ondansetron (ZOFRAN) IV, sodium chloride flush, sodium chloride flush   Vital Signs    Vitals:   08/29/19 2224 08/30/19 0332 08/30/19 0440 08/30/19 0710  BP: 101/68 113/65 113/78 111/70  Pulse: 83 86  81  Resp: 16 16 19 14   Temp: 99.1 F (37.3 C) 98.3 F (36.8 C) 97.9 F (36.6 C) 97.9 F (36.6 C)  TempSrc: Oral Oral Oral Oral  SpO2: 99% 100% 100% 97%  Weight:   98.6 kg   Height:   5\' 11"  (1.803 m)     Intake/Output Summary (Last 24 hours) at 08/30/2019 0859 Last data filed at 08/30/2019 0700 Gross per 24 hour  Intake 3 ml  Output 3400 ml  Net -3397 ml   Last 3 Weights 08/30/2019 08/29/2019 08/28/2019  Weight (lbs) 217 lb 6 oz 214 lb 14.4 oz 219 lb 9.6 oz  Weight (kg) 98.6 kg 97.478 kg 99.61 kg      Telemetry    SR, IVCD- Personally Reviewed  ECG    SR IVCD - Personally Reviewed  Physical Exam   GEN: No acute distress.   Neck: No JVD Cardiac: RRR, no murmurs, rubs, or gallops.  Respiratory: Clear to auscultation bilaterally. GI: Soft, nontender, non-distended  MS: 1+ edema; No deformity. bialteral excoriations. Neuro:   Nonfocal  Psych: Normal affect   Labs    High Sensitivity Troponin:   Recent Labs  Lab 08/26/19 1825 08/26/19 2034  TROPONINIHS 45* 43*      Chemistry Recent Labs  Lab 08/27/19 0528 08/28/19 0514 08/30/19 0505  NA 138 136 136  K 3.6 3.5 3.6  CL 102 101 102  CO2 25 24 23   GLUCOSE 136* 126* 111*  BUN 15 18 17   CREATININE 1.10 1.06 1.16  CALCIUM 8.8* 8.4* 8.5*  GFRNONAA >60 >60 >60  GFRAA >60 >60 >60  ANIONGAP 11 11 11      Hematology Recent Labs  Lab 08/26/19 1825 08/30/19 0505  WBC 7.9 6.7  RBC 4.44 4.21*  HGB 13.0 12.1*  HCT 40.7 38.2*  MCV 91.7 90.7  MCH 29.3 28.7  MCHC 31.9 31.7  RDW 16.7* 16.4*  PLT 192 170    BNP Recent Labs  Lab 08/26/19 1825  BNP 1,615.0*     DDimer No results for input(s): DDIMER in the last 168 hours.   Radiology    No results found.  Cardiac Studies   1. Left ventricular ejection fraction, by estimation, is 20 to 25%. The  left ventricle has severely decreased function. The left ventricle  demonstrates global hypokinesis. There is mild left ventricular  hypertrophy of the basal-septal segment. Left  ventricular diastolic parameters are consistent with Grade III diastolic  dysfunction (restrictive). Elevated left ventricular end-diastolic  pressure.  2. Right ventricular systolic function is severely reduced. The right  ventricular size is mildly enlarged.  3. Left atrial size was mild to moderately dilated.  4. Right atrial size was mildly dilated.  5. The mitral valve is grossly normal. Mild mitral valve regurgitation.  6. The aortic valve is tricuspid. Aortic valve regurgitation is not  visualized.  7. The inferior vena cava is dilated in size with <50% respiratory  variability, suggesting right atrial pressure of 15 mmHg.   Patient Profile     59 y.o. male w/ PMH of CAD (s/p STEMI in 03/2016with DES to LCx), mild ICM (EF 45-50% by cath, at55-60% by echo at that time, EF 50-55% by echo in 09/2017),  paroxysmal atrial fibrillation (previously on Eliquis),PAD (s/p R SFA stenting, right fem-pop bypass in 10/2017), HTN, HLD, prior TIA, COPD and tobacco use who is currently admitted for an acute CHF exacerbation. Off medications for 2+ years prior to admission. Found to have a new LBBB and newly reduced EF of 20-25%.   Assessment & Plan    1. 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 INFORMED CONSENT: I have reviewed the risks, indications, and alternatives to cardiac catheterization, possible angioplasty, and stenting with the patient. Risks include but are not limited to bleeding, infection, vascular injury, stroke, myocardial infection, arrhythmia, kidney injury, radiation-related injury in the case of prolonged fluoroscopy use, emergency cardiac surgery, and death. The patient understands the risks of serious complication is 1-2 in 1000 with diagnostic cardiac cath and 1-2% or less with angioplasty/stenting.    2. CAD - he is s/p STEMI in 03/2016with DES to LCx. He has experienced worsening dyspnea on exertion as outlined above.  - HS Troponin values flat at 45 and 43. EKG does show a new LBBB and echo shows a newly reduced EF of 20-25% with global HK. Will require a cardiac catheterization later this admission once no longer orthopneic. - he has been restarted on ASA 81mg  daily and Atorvastatin 40mg  daily (will need FLP and LFT's in 6-8 weeks following hospital discharge).   3. Paroxysmal Atrial Fibrillation - he denies any recent palpitations and is in NSR this admission. BB currently held given concern for low-output HF.  - he self-discontinued Eluquis approximately 2 years ago. Given his CHA2DS2-VASc Score of 5 with a 7.2 % stroke rate/year (CHF, HTN, Vascular, CVA (2)), would recommend restarting Eliquis 5mg  BID following his catheterization.   4.PAD -s/p R SFA stentingwithright fem-pop  bypass in 10/2017. He has been restarted on ASA 81mg  daily and Atorvastatin 40mg  daily. Will need to follow-up with Vascular Surgery as an outpatient.   5. HTN - BP is normal. Currently on Lisinopril 10mg  daily.   6. Tobacco Use - he was smoking 1 ppd prior to admission. Cessation has been advised.    For questions or updates, please contact [facility] Please consult [email or web address] for contact info under        Signed, , MD  08/30/2019, 8:59 AM

## 2019-08-30 NOTE — Plan of Care (Signed)
  Problem: Health Behavior/Discharge Planning: Goal: Ability to manage health-related needs will improve Outcome: Progressing   Problem: Clinical Measurements: Goal: Ability to maintain clinical measurements within normal limits will improve Outcome: Progressing Goal: Will remain free from infection Outcome: Progressing Goal: Diagnostic test results will improve Outcome: Progressing Goal: Respiratory complications will improve Outcome: Progressing Goal: Cardiovascular complication will be avoided Outcome: Progressing   Problem: Activity: Goal: Risk for activity intolerance will decrease Outcome: Progressing   Problem: Nutrition: Goal: Adequate nutrition will be maintained Outcome: Progressing   Problem: Coping: Goal: Level of anxiety will decrease Outcome: Progressing   Problem: Elimination: Goal: Will not experience complications related to bowel motility Outcome: Progressing Goal: Will not experience complications related to urinary retention Outcome: Progressing   Problem: Pain Managment: Goal: General experience of comfort will improve Outcome: Progressing   Problem: Safety: Goal: Ability to remain free from injury will improve Outcome: Progressing   Problem: Skin Integrity: Goal: Risk for impaired skin integrity will decrease Outcome: Progressing   Problem: Education: Goal: Ability to demonstrate management of disease process will improve Outcome: Progressing Goal: Ability to verbalize understanding of medication therapies will improve Outcome: Progressing Goal: Individualized Educational Video(s) Outcome: Progressing   Problem: Activity: Goal: Capacity to carry out activities will improve Outcome: Progressing   Problem: Cardiac: Goal: Ability to achieve and maintain adequate cardiopulmonary perfusion will improve Outcome: Progressing   Problem: Education: Goal: Individualized Educational Video(s) Outcome: Progressing   Problem: Activity: Goal:  Ability to return to baseline activity level will improve Outcome: Progressing   Problem: Cardiovascular: Goal: Ability to achieve and maintain adequate cardiovascular perfusion will improve Outcome: Progressing Goal: Vascular access site(s) Level 0-1 will be maintained Outcome: Progressing   Problem: Health Behavior/Discharge Planning: Goal: Ability to safely manage health-related needs after discharge will improve Outcome: Progressing   Problem: Education: Goal: Knowledge of General Education information will improve Description: Including pain rating scale, medication(s)/side effects and non-pharmacologic comfort measures Outcome: Completed/Met   Problem: Education: Goal: Understanding of CV disease, CV risk reduction, and recovery process will improve Outcome: Completed/Met

## 2019-08-31 DIAGNOSIS — I5023 Acute on chronic systolic (congestive) heart failure: Secondary | ICD-10-CM

## 2019-08-31 DIAGNOSIS — I25119 Atherosclerotic heart disease of native coronary artery with unspecified angina pectoris: Secondary | ICD-10-CM

## 2019-08-31 LAB — BASIC METABOLIC PANEL
Anion gap: 10 (ref 5–15)
BUN: 17 mg/dL (ref 8–23)
CO2: 25 mmol/L (ref 22–32)
Calcium: 8.4 mg/dL — ABNORMAL LOW (ref 8.9–10.3)
Chloride: 99 mmol/L (ref 98–111)
Creatinine, Ser: 1.13 mg/dL (ref 0.61–1.24)
GFR calc Af Amer: >60 mL/min (ref >60)
GFR calc non Af Amer: >60 mL/min (ref >60)
Glucose, Bld: 119 mg/dL — ABNORMAL HIGH (ref 70–99)
Potassium: 3.3 mmol/L — ABNORMAL LOW (ref 3.5–5.1)
Sodium: 134 mmol/L — ABNORMAL LOW (ref 135–145)

## 2019-08-31 LAB — CBC
HCT: 39.1 % (ref 39.0–52.0)
Hemoglobin: 12.8 g/dL — ABNORMAL LOW (ref 13.0–17.0)
MCH: 29.3 pg (ref 26.0–34.0)
MCHC: 32.7 g/dL (ref 30.0–36.0)
MCV: 89.5 fL (ref 80.0–100.0)
Platelets: 178 10*3/uL (ref 150–400)
RBC: 4.37 MIL/uL (ref 4.22–5.81)
RDW: 16.4 % — ABNORMAL HIGH (ref 11.5–15.5)
WBC: 6.3 10*3/uL (ref 4.0–10.5)
nRBC: 0 % (ref 0.0–0.2)

## 2019-08-31 MED ORDER — CLOPIDOGREL BISULFATE 300 MG PO TABS: 300.0000 mg | ORAL_TABLET | Freq: Once | ORAL | Status: DC

## 2019-08-31 MED ORDER — APIXABAN 5 MG PO TABS
5.0000 mg | ORAL_TABLET | Freq: Two times a day (BID) | ORAL | Status: DC
  Administered 2019-08-31 – 2019-09-01 (×3): 5 mg via ORAL
  Filled 2019-08-31 (×3): qty 1

## 2019-08-31 MED ORDER — FUROSEMIDE 40 MG PO TABS
40.0000 mg | ORAL_TABLET | Freq: Every day | ORAL | Status: DC
  Administered 2019-08-31 – 2019-09-01 (×2): 40 mg via ORAL
  Filled 2019-08-31 (×2): qty 1

## 2019-08-31 MED ORDER — CLOPIDOGREL BISULFATE 75 MG PO TABS
75.0000 mg | ORAL_TABLET | Freq: Every day | ORAL | Status: DC
  Administered 2019-09-01: 75 mg via ORAL
  Filled 2019-08-31: qty 1

## 2019-08-31 MED ORDER — CLOPIDOGREL BISULFATE 300 MG PO TABS
600.0000 mg | ORAL_TABLET | Freq: Once | ORAL | Status: AC
  Administered 2019-08-31: 600 mg via ORAL
  Filled 2019-08-31: qty 2

## 2019-08-31 MED ORDER — SPIRONOLACTONE 12.5 MG HALF TABLET
12.5000 mg | ORAL_TABLET | Freq: Every day | ORAL | Status: DC
  Administered 2019-08-31 – 2019-09-01 (×2): 12.5 mg via ORAL
  Filled 2019-08-31 (×2): qty 1

## 2019-08-31 MED ORDER — BISOPROLOL FUMARATE 5 MG PO TABS
5.0000 mg | ORAL_TABLET | Freq: Every day | ORAL | Status: DC
  Administered 2019-09-01: 5 mg via ORAL
  Filled 2019-08-31: qty 1

## 2019-08-31 NOTE — Plan of Care (Signed)
  Problem: Clinical Measurements: Goal: Will remain free from infection Outcome: Progressing Goal: Diagnostic test results will improve Outcome: Progressing Goal: Cardiovascular complication will be avoided Outcome: Progressing   Problem: Activity: Goal: Risk for activity intolerance will decrease Outcome: Progressing   Problem: Coping: Goal: Level of anxiety will decrease Outcome: Progressing   Problem: Pain Managment: Goal: General experience of comfort will improve Outcome: Progressing   Problem: Safety: Goal: Ability to remain free from injury will improve Outcome: Progressing   Problem: Skin Integrity: Goal: Risk for impaired skin integrity will decrease Outcome: Progressing   Problem: Education: Goal: Ability to demonstrate management of disease process will improve Outcome: Progressing Goal: Ability to verbalize understanding of medication therapies will improve Outcome: Progressing Goal: Individualized Educational Video(s) Outcome: Progressing   Problem: Activity: Goal: Capacity to carry out activities will improve Outcome: Progressing   Problem: Cardiac: Goal: Ability to achieve and maintain adequate cardiopulmonary perfusion will improve Outcome: Progressing   Problem: Health Behavior/Discharge Planning: Goal: Ability to safely manage health-related needs after discharge will improve Outcome: Progressing   Problem: Health Behavior/Discharge Planning: Goal: Ability to manage health-related needs will improve Outcome: Completed/Met   Problem: Clinical Measurements: Goal: Ability to maintain clinical measurements within normal limits will improve Outcome: Completed/Met Goal: Respiratory complications will improve Outcome: Completed/Met   Problem: Nutrition: Goal: Adequate nutrition will be maintained Outcome: Completed/Met   Problem: Elimination: Goal: Will not experience complications related to bowel motility Outcome: Completed/Met Goal: Will  not experience complications related to urinary retention Outcome: Completed/Met   Problem: Education: Goal: Individualized Educational Video(s) Outcome: Completed/Met   Problem: Activity: Goal: Ability to return to baseline activity level will improve Outcome: Completed/Met   Problem: Cardiovascular: Goal: Ability to achieve and maintain adequate cardiovascular perfusion will improve Outcome: Completed/Met Goal: Vascular access site(s) Level 0-1 will be maintained Outcome: Completed/Met

## 2019-08-31 NOTE — Progress Notes (Signed)
CARDIAC REHAB PHASE I   PRE:  Rate/Rhythm: 81 SR   BP:  Sitting: 103/72      SaO2: 99% RA  MODE:  Ambulation: 280 ft   POST:  Rate/Rhythm: 90 SR  BP:  Sitting: 116/80      SaO2: 99% RA  Pt in bed willing to walk, very pleasant. Pt ambulated independently for 264ft. Gait was slow and steady, with limp. Pt did not want to use cane. Pt returned back to bed sitting up. Began education. Reviewed importance of taking medications, smoking cessation, stent card, and NTG use. Also reviewed heart failure booklet. Pt has been reading book and was able to teach back what he read. Reviewed daily weights, low sodium diet and phase II cardiac rehab. Will send referral to [facility], per pt's request. Brochure  Given. Pt verbalized understanding of education. Pt states he will do his best to make some lifestyle changes.   York Cerise MS, ACSM CEP 08/31/2019  [phone]    York Cerise MS, ACSM CEP  8:46 AM 08/31/2019

## 2019-08-31 NOTE — Progress Notes (Signed)
Progress Note  Patient Name: Eugene Parker Date of Encounter: 08/31/2019  Primary Cardiologist: Dina Rich, MD  Subjective   No chest pain or palpitations.  Has walked around some without unusual breathlessness.  Did not necessarily feel the atrial fibrillation that he experienced overnight.  Inpatient Medications    Scheduled Meds: . aspirin  81 mg Oral Daily  . atorvastatin  40 mg Oral q1800  . Chlorhexidine Gluconate Cloth  6 each Topical Q0600  . collagenase   Topical Daily  . furosemide  40 mg Intravenous BID  . lisinopril  10 mg Oral Daily  . metoprolol tartrate  12.5 mg Oral BID  . mupirocin ointment  1 application Nasal BID  . potassium chloride  40 mEq Oral Daily  . sodium chloride flush  3 mL Intravenous Q12H  . sodium chloride flush  3 mL Intravenous Q12H  . sodium chloride flush  3 mL Intravenous Q12H  . ticagrelor  90 mg Oral BID   Continuous Infusions: . sodium chloride    . sodium chloride     PRN Meds: sodium chloride, sodium chloride, acetaminophen, albuterol, HYDROcodone-acetaminophen, nitroGLYCERIN, ondansetron (ZOFRAN) IV, sodium chloride flush, sodium chloride flush   Vital Signs    Vitals:   08/30/19 2330 08/31/19 0322 08/31/19 0400 08/31/19 0703  BP: 118/76 93/64 97/65  103/72  Pulse: 82 82 77 78  Resp: 18 14 17 15   Temp: 98.3 F (36.8 C)  98.6 F (37 C) 98.4 F (36.9 C)  TempSrc: Oral  Oral Oral  SpO2: 96%  98% 98%  Weight:   92.4 kg   Height:        Intake/Output Summary (Last 24 hours) at 08/31/2019 0953 Last data filed at 08/31/2019 0900 Gross per 24 hour  Intake 1770.36 ml  Output 4150 ml  Net -2379.64 ml   Filed Weights   08/29/19 0443 08/30/19 0440 08/31/19 0400  Weight: 97.5 kg 98.6 kg 92.4 kg    Telemetry    Sinus rhythm.  Personally reviewed.  ECG    An ECG dated 08/31/2019 was personally reviewed today and demonstrated:  Sinus rhythm with left bundle branch block.  Physical Exam   GEN: No acute  distress.   Neck: No JVD. Cardiac: RRR, no murmur, rub, or gallop.  Respiratory: Nonlabored. Clear to auscultation bilaterally. GI: Soft, nontender, bowel sounds present. MS: No edema; No deformity. Neuro:  Nonfocal. Psych: Alert and oriented x 3. Normal affect.  Labs    Chemistry Recent Labs  Lab 08/28/19 0514 08/28/19 0514 08/30/19 0505 08/30/19 0505 08/30/19 1557 08/30/19 1600 08/31/19 0245  NA 136   < > 136   < > 139 139 134*  K 3.5   < > 3.6   < > 3.7 3.7 3.3*  CL 101  --  102  --   --   --  99  CO2 24  --  23  --   --   --  25  GLUCOSE 126*  --  111*  --   --   --  119*  BUN 18  --  17  --   --   --  17  CREATININE 1.06  --  1.16  --   --   --  1.13  CALCIUM 8.4*  --  8.5*  --   --   --  8.4*  GFRNONAA >60  --  >60  --   --   --  >60  GFRAA >60  --  >  60  --   --   --  >60  ANIONGAP 11  --  11  --   --   --  10   < > = values in this interval not displayed.     Hematology Recent Labs  Lab 08/26/19 1825 08/26/19 1825 08/30/19 0505 08/30/19 0505 08/30/19 1557 08/30/19 1600 08/31/19 0245  WBC 7.9  --  6.7  --   --   --  6.3  RBC 4.44  --  4.21*  --   --   --  4.37  HGB 13.0   < > 12.1*   < > 13.6 13.6 12.8*  HCT 40.7   < > 38.2*   < > 40.0 40.0 39.1  MCV 91.7  --  90.7  --   --   --  89.5  MCH 29.3  --  28.7  --   --   --  29.3  MCHC 31.9  --  31.7  --   --   --  32.7  RDW 16.7*  --  16.4*  --   --   --  16.4*  PLT 192  --  170  --   --   --  178   < > = values in this interval not displayed.    Cardiac Enzymes Recent Labs  Lab 08/26/19 1825 08/26/19 2034  TROPONINIHS 45* 43*    BNP Recent Labs  Lab 08/26/19 1825  BNP 1,615.0*     Radiology    CARDIAC CATHETERIZATION  Result Date: 08/30/2019  Prox RCA lesion is 100% stenosed. Left to right collaterals.  Mid Cx lesion is 25% stenosed.  Mid LAD lesion is 75% stenosed. A drug-eluting stent was successfully placed using a SYNERGY XD 3.0X28, postdilated to 3.5 mm proximally and optimized  with IVUS.  Post intervention, there is a 0% residual stenosis.  1st Diag lesion is 100% stenosed. This was a chronic total occlusion. Balloon angioplasty was performed using a BALLOON SAPPHIRE 2.0X12.  Post intervention, there is a 40% residual stenosis. Vessel appears chronically underfilled compared to prior films. Hopefully, this will improve over time.  LV end diastolic pressure is mildly elevated.  There is no aortic valve stenosis.  Hemodynamic findings consistent with mild pulmonary hypertension.  Ao sat 98%, PA sat 62%, mean PA pressure 31 mm Hg; mean PCWP 24 mm Hg; CO 4.9 L/min; CI 2.25  Balloon angioplasty was performed using a BALLOON SAPPHIRE 2.0X12.     Cardiac Studies   Echocardiogram 08/27/2019: 1. Left ventricular ejection fraction, by estimation, is 20 to 25%. The  left ventricle has severely decreased function. The left ventricle  demonstrates global hypokinesis. There is mild left ventricular  hypertrophy of the basal-septal segment. Left  ventricular diastolic parameters are consistent with Grade III diastolic  dysfunction (restrictive). Elevated left ventricular end-diastolic  pressure.  2. Right ventricular systolic function is severely reduced. The right  ventricular size is mildly enlarged.  3. Left atrial size was mild to moderately dilated.  4. Right atrial size was mildly dilated.  5. The mitral valve is grossly normal. Mild mitral valve regurgitation.  6. The aortic valve is tricuspid. Aortic valve regurgitation is not  visualized.  7. The inferior vena cava is dilated in size with <50% respiratory  variability, suggesting right atrial pressure of 15 mmHg.   Patient Profile     59 y.o. male with a history of CAD status post DES to the circumflex in the setting of STEMI in  2016, ischemic cardiomyopathy, paroxysmal atrial fibrillation, PAD, hypertension, hyperlipidemia, previous TIA, COPD, and tobacco abuse.  He is currently admitted with acute on  chronic systolic heart failure with worsening LVEF.  Assessment & Plan    1.  CAD status post previous DES to the circumflex in 2016.  Now status post cardiac catheterization on May 14 demonstrating occluded proximal RCA with collaterals, 75% mid LAD stenosis treated with DES, and occluded first diagonal treated with angioplasty.  Currently on aspirin and Brilinta.  2.  Ischemic cardiomyopathy, most recent LVEF 20 to 25% range with restrictive diastolic filling pattern.  Right heart catheterization showed cardiac output and index of 4.9 and 2.25 respectively.  Receiving on IV Lasix with consistent improvement in fluid status.  On lisinopril, Lopressor, and IV Lasix with potassium supplement.  3.  Paroxysmal atrial fibrillation with CHA2DS2-VASc score of 5.  Not on anticoagulation recently, was previously on Eliquis.  Had a recurrent episode of rapid atrial fibrillation overnight.  Continue aspirin and switch from Brilinta to Plavix with plan to initiate Eliquis as well in light of PAF and high thromboembolic risk score.  Switching from Lopressor to bisoprolol, continue lisinopril (can possibly change to [facility] as an outpatient later), convert to oral Lasix, and start low-dose Aldactone.  Not ready for discharge as yet, would observe today given substantial modification in his medications.  Signed, Nona Dell, MD  08/31/2019, 9:53 AM

## 2019-08-31 NOTE — Care Management (Signed)
Patient provided with 30 day Brilinta Card and taxi voucher to get to [facility] where his car is.

## 2019-09-01 DIAGNOSIS — I2510 Atherosclerotic heart disease of native coronary artery without angina pectoris: Secondary | ICD-10-CM

## 2019-09-01 DIAGNOSIS — I5021 Acute systolic (congestive) heart failure: Secondary | ICD-10-CM

## 2019-09-01 LAB — CBC
HCT: 42.1 % (ref 39.0–52.0)
Hemoglobin: 13.5 g/dL (ref 13.0–17.0)
MCH: 28.7 pg (ref 26.0–34.0)
MCHC: 32.1 g/dL (ref 30.0–36.0)
MCV: 89.6 fL (ref 80.0–100.0)
Platelets: 179 10*3/uL (ref 150–400)
RBC: 4.7 MIL/uL (ref 4.22–5.81)
RDW: 16.3 % — ABNORMAL HIGH (ref 11.5–15.5)
WBC: 7 10*3/uL (ref 4.0–10.5)
nRBC: 0 % (ref 0.0–0.2)

## 2019-09-01 LAB — BASIC METABOLIC PANEL
Anion gap: 9 (ref 5–15)
BUN: 18 mg/dL (ref 8–23)
CO2: 25 mmol/L (ref 22–32)
Calcium: 8.7 mg/dL — ABNORMAL LOW (ref 8.9–10.3)
Chloride: 101 mmol/L (ref 98–111)
Creatinine, Ser: 1.25 mg/dL — ABNORMAL HIGH (ref 0.61–1.24)
GFR calc Af Amer: >60 mL/min (ref >60)
GFR calc non Af Amer: >60 mL/min (ref >60)
Glucose, Bld: 124 mg/dL — ABNORMAL HIGH (ref 70–99)
Potassium: 3.9 mmol/L (ref 3.5–5.1)
Sodium: 135 mmol/L (ref 135–145)

## 2019-09-01 MED ORDER — LISINOPRIL 10 MG PO TABS: 10.0000 mg | ORAL_TABLET | Freq: Every day | ORAL | 2 refills | Status: DC

## 2019-09-01 MED ORDER — CLOPIDOGREL BISULFATE 75 MG PO TABS: 75.0000 mg | ORAL_TABLET | Freq: Every day | ORAL | 2 refills | Status: DC

## 2019-09-01 MED ORDER — ATORVASTATIN CALCIUM 40 MG PO TABS: 40.0000 mg | ORAL_TABLET | Freq: Every day | ORAL | 2 refills | Status: AC

## 2019-09-01 MED ORDER — SPIRONOLACTONE 25 MG PO TABS: 12.5000 mg | ORAL_TABLET | Freq: Every day | ORAL | 2 refills | Status: DC

## 2019-09-01 MED ORDER — POTASSIUM CHLORIDE ER 20 MEQ PO TBCR: 20.0000 meq | EXTENDED_RELEASE_TABLET | Freq: Every day | ORAL | 2 refills | Status: DC

## 2019-09-01 MED ORDER — APIXABAN 5 MG PO TABS: 5.0000 mg | ORAL_TABLET | Freq: Two times a day (BID) | ORAL | 2 refills | Status: AC

## 2019-09-01 MED ORDER — BISOPROLOL FUMARATE 5 MG PO TABS: 5.0000 mg | ORAL_TABLET | Freq: Every day | ORAL | 2 refills | Status: DC

## 2019-09-01 MED ORDER — FUROSEMIDE 40 MG PO TABS: 40.0000 mg | ORAL_TABLET | Freq: Every day | ORAL | 2 refills | Status: DC

## 2019-09-01 MED ORDER — ASPIRIN 81 MG PO CHEW: 81.0000 mg | CHEWABLE_TABLET | Freq: Every day | ORAL | Status: DC

## 2019-09-01 NOTE — Discharge Instructions (Signed)
Heart Failure, Diagnosis  Heart failure means that your heart is not able to pump blood in the right way. This makes it hard for your body to work well. Heart failure is usually a long-term (chronic) condition. You must take good care of yourself and follow your treatment plan from your doctor. What are the causes? This condition may be caused by:  High blood pressure.  Build up of cholesterol and fat in the arteries.  Heart attack. This injures the heart muscle.  Heart valves that do not open and close properly.  Damage of the heart muscle. This is also called cardiomyopathy.  Lung disease.  Abnormal heart rhythms. What increases the risk? The risk of heart failure goes up as a person ages. This condition is also more likely to develop in people who:  Are overweight.  Are male.  Smoke or chew tobacco.  Abuse alcohol or illegal drugs.  Have taken medicines that can damage the heart.  Have diabetes.  Have abnormal heart rhythms.  Have thyroid problems.  Have low blood counts (anemia). What are the signs or symptoms? Symptoms of this condition include:  Shortness of breath.  Coughing.  Swelling of the feet, ankles, legs, or belly.  Losing weight for no reason.  Trouble breathing.  Waking from sleep because of the need to sit up and get more air.  Rapid heartbeat.  Being very tired.  Feeling dizzy, or feeling like you may pass out (faint).  Having no desire to eat.  Feeling like you may vomit (nauseous).  Peeing (urinating) more at night.  Feeling confused. How is this treated?     This condition may be treated with:  Medicines. These can be given to treat blood pressure and to make the heart muscles stronger.  Changes in your daily life. These may include eating a healthy diet, staying at a healthy body weight, quitting tobacco and illegal drug use, or doing exercises.  Surgery. Surgery can be done to open blocked valves, or to put devices in  the heart, such as pacemakers.  A donor heart (heart transplant). You will receive a healthy heart from a donor. Follow these instructions at home:  Treat other conditions as told by your doctor. These may include high blood pressure, diabetes, thyroid disease, or abnormal heart rhythms.  Learn as much as you can about heart failure.  Get support as you need it.  Keep all follow-up visits as told by your doctor. This is important. Summary  Heart failure means that your heart is not able to pump blood in the right way.  This condition is caused by high blood pressure, heart attack, or damage of the heart muscle.  Symptoms of this condition include shortness of breath and swelling of the feet, ankles, legs, or belly. You may also feel very tired or feel like you may vomit.  You may be treated with medicines, surgery, or changes in your daily life.  Treat other health conditions as told by your doctor. This information is not intended to replace advice given to you by your health care provider. Make sure you discuss any questions you have with your health care provider. Document Revised: 06/22/2018 Document Reviewed: 06/22/2018 Elsevier Patient Education  [place].   Heart Failure, Self Care Heart failure is a serious condition. This document explains the things you need to do to take care of yourself after a heart failure diagnosis. You may be asked to change your diet, take certain medicines, and make  other lifestyle changes in order to stay as healthy as possible. Your health care provider may also give you more specific instructions. If you have problems or questions, contact your health care provider. What are the risks? Having heart failure puts you at higher risk for certain problems. These problems can get worse if you do not take good care of yourself. Problems may include:  Blood clotting problems. This may cause a stroke.  Damage to the kidneys, liver, or  lungs.  Abnormal heart rhythms. Supplies needed:  Scale for monitoring weight.  Blood pressure monitor.  Notebook.  Medicines. How to care for yourself when you have heart failure Medicines Take over-the-counter and prescription medicines only as told by your health care provider. Medicines reduce the workload of your heart, slow the progression of heart failure, and improve symptoms. Take your medicines every day.  Do not stop taking your medicine unless your health care provider tells you to do so.  Do not skip any dose of medicine.  Refill your prescriptions before you run out of medicine. Eating and drinking   Eat heart-healthy foods. Talk with a dietitian to make an eating plan that is right for you. ? Choose foods that contain no trans fat and are low in saturated fat and cholesterol. Healthy choices include fresh or frozen fruits and vegetables, fish, lean meats, legumes, fat-free or low-fat dairy products, and whole-grain or high-fiber foods. ? Limit salt (sodium) if told by your health care provider. Sodium restriction may reduce symptoms of heart failure. Ask a dietitian to recommend heart-healthy seasonings. ? Use healthy cooking methods instead of frying. Healthy methods include roasting, grilling, broiling, baking, poaching, steaming, and stir-frying.  Limit your fluid intake, if directed by your health care provider. Fluid restriction may reduce symptoms of heart failure. Alcohol use  Do not drink alcohol if: ? Your health care provider tells you not to drink. ? Your heart was damaged by alcohol, or you have severe heart failure. ? You are pregnant, may be pregnant, or are planning to become pregnant.  If you drink alcohol: ? Limit how much you use to:  0-1 drink a day for women.  0-2 drinks a day for men. ? Be aware of how much alcohol is in your drink. In the [place] one drink equals one 12 oz bottle of beer (355 mL), one 5 oz glass of wine (148 mL), or one 1  oz glass of hard liquor (44 mL). Lifestyle   Do not use any products that contain nicotine or tobacco, such as cigarettes, e-cigarettes, and chewing tobacco. If you need help quitting, ask your health care provider. ? Do not use nicotine gum or patches before talking to your health care provider.  Do not use illegal drugs.  Work with your health care provider to safely reach the right body weight.  Do physical activity if told by your health care provider. Talk to your health care provider before you begin an exercise if: ? You are an older adult. ? You have severe heart failure.  Learn to manage stress. If you need help to do this, ask your health care provider.  Participate in or seek rehabilitation as needed to keep or improve your independence and quality of life.  Plan rest periods when you get tired. Monitoring important information   Weigh yourself every day. This will help you to notice if too much fluid is building up in your body. ? Weigh yourself every morning after you urinate and before  you eat breakfast. ? Wear the same amount of clothing each time you weigh yourself. ? Record your daily weight. Provide your health care provider with your weight record.  Monitor and record your pulse and blood pressure as told by your health care provider. Dealing with extreme temperatures  If the weather is extremely hot: ? Avoid vigorous physical activity. ? Use air conditioning or fans, or find a cooler location. ? Avoid caffeine and alcohol. ? Wear loose-fitting, lightweight, and light-colored clothing.  If the weather is extremely cold: ? Avoid vigorous activity. ? Layer your clothes. ? Wear mittens or gloves, a hat, and a scarf when you go outside. ? Avoid alcohol. Follow these instructions at home:  Stay up to date with vaccines. Pneumococcal and flu (influenza) vaccines are especially important in preventing infections of the airways.  Keep all follow-up visits as  told by your health care provider. This is important. Contact a health care provider if you:  Have a rapid weight gain.  Have increasing shortness of breath.  Are unable to participate in your usual physical activities.  Get tired easily.  Cough more than normal, especially with physical activity.  Lose your appetite or feel nauseous.  Have any swelling or more swelling in areas such as your hands, feet, ankles, or abdomen.  Are unable to sleep because it is hard to breathe.  Feel like your heart is beating quickly (palpitations).  Become dizzy or light-headed when you stand up. Get help right away if you:  Have trouble breathing.  Notice or your family notices a change in your awareness, such as having trouble staying awake or concentrating.  Have pain or discomfort in your chest.  Have an episode of fainting (syncope). These symptoms may represent a serious problem that is an emergency. Do not wait to see if the symptoms will go away. Get medical help right away. Call your local emergency services (911 in the [place] Do not drive yourself to the hospital. Summary  Heart failure is a serious condition. To care for yourself, you may be asked to change your diet, take certain medicines, and make other lifestyle changes.  Take your medicines every day. Do not stop taking them unless your health care provider tells you to do so.  Eat heart-healthy foods, such as fresh or frozen fruits and vegetables, fish, lean meats, legumes, fat-free or low-fat dairy products, and whole-grain or high-fiber foods.  Ask your health care provider if you have any alcohol restrictions. You may have to stop drinking alcohol if you have severe heart failure.  Contact your health care provider if you notice problems, such as rapid weight gain or a fast heartbeat. Get help right away if you faint, or have chest pain or trouble breathing. This information is not intended to replace advice given to you  by your health care provider. Make sure you discuss any questions you have with your health care provider. Document Revised: 07/17/2018 Document Reviewed: 07/18/2018 Elsevier Patient Education  2020 [employer]   Heart Failure, Diagnosis  Heart failure is a condition in which the heart has trouble pumping blood because it has become weak or stiff. This means that the heart does not pump blood well enough for the body to stay healthy. For some people with heart failure, fluid may back up into the lungs. There may also be swelling (edema) in the lower legs. Heart failure is usually a long-term (chronic) condition. It is important for you to take good care of  yourself and follow the treatment plan from your health care provider. What are the causes? This condition may be caused by:  High blood pressure (hypertension). Hypertension causes the heart muscle to work harder than normal. This makes the heart stiff or weak.  Coronary artery disease, or CAD. CAD is the buildup of cholesterol and fat (plaque) in the arteries of the heart.  Heart attack, also called myocardial infarction. This injures the heart muscle, making it hard for the heart to pump blood.  Abnormal heart valves. The valves do not open and close properly, forcing the heart to pump harder to keep the blood flowing.  Heart muscle disease (cardiomyopathy or myocarditis). This is damage to the heart muscle. It can increase the risk of heart failure.  Lung disease. The heart works harder when the lungs are not healthy.  Abnormal heart rhythms. These can lead to heart failure. What increases the risk? The risk of heart failure increases as a person ages. This condition is also more likely to develop in people who:  Are overweight.  Are male.  Smoke or chew tobacco.  Abuse alcohol or illegal drugs.  Have taken medicines that can damage the heart, such as chemotherapy drugs.  Have diabetes.  Have abnormal heart  rhythms.  Have thyroid problems.  Have low blood counts (anemia). What are the signs or symptoms? Symptoms of this condition include:  Shortness of breath with activity, such as when climbing stairs.  A cough that does not go away.  Swelling of the feet, ankles, legs, or abdomen.  Losing weight for no reason.  Trouble breathing when lying flat (orthopnea).  Waking from sleep because of the need to sit up and get more air.  Rapid heartbeat.  Tiredness (fatigue) and loss of energy.  Feeling light-headed, dizzy, or close to fainting.  Loss of appetite.  Nausea.  Waking up more often during the night to urinate (nocturia).  Confusion. How is this diagnosed? This condition is diagnosed based on:  Your medical history, symptoms, and a physical exam.  Diagnostic tests, which may include: ? Echocardiogram. ? Electrocardiogram (ECG). ? Chest X-ray. ? Blood tests. ? Exercise stress test. ? Radionuclide scans. ? Cardiac catheterization and angiogram. How is this treated? Treatment for this condition is aimed at managing the symptoms of heart failure. Medicines Treatment may include medicines that:  Help lower blood pressure by relaxing (dilating) the blood vessels. These medicines are called ACE inhibitors (angiotensin-converting enzyme) and ARBs (angiotensin receptor blockers).  Cause the kidneys to remove salt and water from the blood through urination (diuretics).  Improve heart muscle strength and prevent the heart from beating too fast (beta blockers).  Increase the force of the heartbeat (digoxin). Healthy behavior changes     Treatment may also include making healthy lifestyle changes, such as:  Reaching and staying at a healthy weight.  Quitting smoking or chewing tobacco.  Eating heart-healthy foods.  Limiting or avoiding alcohol.  Stopping the use of illegal drugs.  Being physically active.  Other treatments Other treatments may  include:  Procedures to open blocked arteries or repair damaged valves.  Placing a pacemaker to improve heart function (cardiac resynchronization therapy).  Placing a device to treat serious abnormal heart rhythms (implantable cardioverter defibrillator, or ICD).  Placing a device to improve the pumping ability of the heart (left ventricular assist device, or LVAD).  Receiving a healthy heart from a donor (heart transplant). This is done when other treatments have not helped. Follow these instructions at  home:  Manage other health conditions as told by your health care provider. These may include hypertension, diabetes, thyroid disease, or abnormal heart rhythms.  Get ongoing education and support as needed. Learn as much as you can about heart failure.  Keep all follow-up visits as told by your health care provider. This is important. Summary  Heart failure is a condition in which the heart has trouble pumping blood because it has become weak or stiff.  This condition is caused by high blood pressure and other diseases of the heart and lungs.  Symptoms of this condition include shortness of breath, tiredness (fatigue), nausea, and swelling of the feet, ankles, legs, or abdomen.  Treatments for this condition may include medicines, lifestyle changes, and surgery.  Manage other health conditions as told by your health care provider. This information is not intended to replace advice given to you by your health care provider. Make sure you discuss any questions you have with your health care provider. Document Revised: 06/22/2018 Document Reviewed: 06/22/2018 Elsevier Patient Education  [place] on my medicine - ELIQUIS (apixaban)  This medication education was reviewed with me or my healthcare representative as part of my discharge preparation.  The pharmacist that spoke with me during my hospital stay was:  Onnie Boer, RPH-CPP  Why was Eliquis prescribed  for you? Eliquis was prescribed for you to reduce the risk of a blood clot forming that can cause a stroke if you have a medical condition called atrial fibrillation (a type of irregular heartbeat).  What do You need to know about Eliquis ? Take your Eliquis TWICE DAILY - one tablet in the morning and one tablet in the evening with or without food. If you have difficulty swallowing the tablet whole please discuss with your pharmacist how to take the medication safely.  Take Eliquis exactly as prescribed by your doctor and DO NOT stop taking Eliquis without talking to the doctor who prescribed the medication.  Stopping may increase your risk of developing a stroke.  Refill your prescription before you run out.  After discharge, you should have regular check-up appointments with your healthcare provider that is prescribing your Eliquis.  In the future your dose may need to be changed if your kidney function or weight changes by a significant amount or as you get older.  What do you do if you miss a dose? If you miss a dose, take it as soon as you remember on the same day and resume taking twice daily.  Do not take more than one dose of ELIQUIS at the same time to make up a missed dose.  Important Safety Information A possible side effect of Eliquis is bleeding. You should call your healthcare provider right away if you experience any of the following: ? Bleeding from an injury or your nose that does not stop. ? Unusual colored urine (red or dark brown) or unusual colored stools (red or black). ? Unusual bruising for unknown reasons. ? A serious fall or if you hit your head (even if there is no bleeding).  Some medicines may interact with Eliquis and might increase your risk of bleeding or clotting while on Eliquis. To help avoid this, consult your healthcare provider or pharmacist prior to using any new prescription or non-prescription medications, including herbals, vitamins,  non-steroidal anti-inflammatory drugs (NSAIDs) and supplements.  This website has more information on Eliquis (apixaban): http://[email or web address].com/eliquis/home

## 2019-09-01 NOTE — Plan of Care (Signed)
  Problem: Clinical Measurements: Goal: Will remain free from infection Outcome: Completed/Met Goal: Diagnostic test results will improve Outcome: Completed/Met Goal: Cardiovascular complication will be avoided Outcome: Completed/Met   Problem: Activity: Goal: Risk for activity intolerance will decrease Outcome: Completed/Met   Problem: Coping: Goal: Level of anxiety will decrease Outcome: Completed/Met   Problem: Pain Managment: Goal: General experience of comfort will improve Outcome: Completed/Met   Problem: Safety: Goal: Ability to remain free from injury will improve Outcome: Completed/Met   Problem: Skin Integrity: Goal: Risk for impaired skin integrity will decrease Outcome: Completed/Met   Problem: Education: Goal: Ability to demonstrate management of disease process will improve Outcome: Completed/Met Goal: Ability to verbalize understanding of medication therapies will improve Outcome: Completed/Met Goal: Individualized Educational Video(s) Outcome: Completed/Met   Problem: Activity: Goal: Capacity to carry out activities will improve Outcome: Completed/Met   Problem: Cardiac: Goal: Ability to achieve and maintain adequate cardiopulmonary perfusion will improve Outcome: Completed/Met   Problem: Health Behavior/Discharge Planning: Goal: Ability to safely manage health-related needs after discharge will improve Outcome: Completed/Met

## 2019-09-01 NOTE — Progress Notes (Signed)
Progress Note  Patient Name: Eugene Parker Date of Encounter: 09/01/2019  Primary Cardiologist: Dina Rich, MD  Subjective   No chest pain or palpitations, eating breakfast.  No shortness of breath at rest.  Inpatient Medications    Scheduled Meds: . apixaban  5 mg Oral BID  . aspirin  81 mg Oral Daily  . atorvastatin  40 mg Oral q1800  . bisoprolol  5 mg Oral Daily  . Chlorhexidine Gluconate Cloth  6 each Topical Q0600  . clopidogrel  75 mg Oral Daily  . collagenase   Topical Daily  . furosemide  40 mg Oral Daily  . lisinopril  10 mg Oral Daily  . mupirocin ointment  1 application Nasal BID  . potassium chloride  40 mEq Oral Daily  . sodium chloride flush  3 mL Intravenous Q12H  . sodium chloride flush  3 mL Intravenous Q12H  . sodium chloride flush  3 mL Intravenous Q12H  . spironolactone  12.5 mg Oral Daily   Continuous Infusions: . sodium chloride    . sodium chloride     PRN Meds: sodium chloride, sodium chloride, acetaminophen, albuterol, HYDROcodone-acetaminophen, nitroGLYCERIN, ondansetron (ZOFRAN) IV, sodium chloride flush, sodium chloride flush   Vital Signs    Vitals:   08/31/19 2335 08/31/19 2336 09/01/19 0332 09/01/19 0746  BP: 112/66  100/72 101/67  Pulse: 86  87 84  Resp: 18 18 17 19   Temp: 98.6 F (37 C)  97.9 F (36.6 C) 97.9 F (36.6 C)  TempSrc: Oral  Oral Oral  SpO2: 98%  98% 99%  Weight:   92.2 kg   Height:        Intake/Output Summary (Last 24 hours) at 09/01/2019 0848 Last data filed at 09/01/2019 0746 Gross per 24 hour  Intake 1680 ml  Output 2300 ml  Net -620 ml   Filed Weights   08/30/19 0440 08/31/19 0400 09/01/19 0332  Weight: 98.6 kg 92.4 kg 92.2 kg    Telemetry    Sinus rhythm.  Personally reviewed.  ECG    I personally reviewed his tracing from 09/01/2019 which shows a sinus rhythm with left bundle branch block.  Physical Exam   GEN: No acute distress.   Neck: No JVD. Cardiac: RRR, no murmur,  rub, or gallop.  Respiratory: Nonlabored. Clear to auscultation bilaterally. GI: Soft, nontender, bowel sounds present. MS: No edema; No deformity. Neuro:  Nonfocal. Psych: Alert and oriented x 3. Normal affect.  Labs    Chemistry Recent Labs  Lab 08/30/19 0505 08/30/19 1557 08/30/19 1600 08/31/19 0245 09/01/19 0214  NA 136   < > 139 134* 135  K 3.6   < > 3.7 3.3* 3.9  CL 102  --   --  99 101  CO2 23  --   --  25 25  GLUCOSE 111*  --   --  119* 124*  BUN 17  --   --  17 18  CREATININE 1.16  --   --  1.13 1.25*  CALCIUM 8.5*  --   --  8.4* 8.7*  GFRNONAA >60  --   --  >60 >60  GFRAA >60  --   --  >60 >60  ANIONGAP 11  --   --  10 9   < > = values in this interval not displayed.     Hematology Recent Labs  Lab 08/30/19 0505 08/30/19 1557 08/30/19 1600 08/31/19 0245 09/01/19 0214  WBC 6.7  --   --  6.3 7.0  RBC 4.21*  --   --  4.37 4.70  HGB 12.1*   < > 13.6 12.8* 13.5  HCT 38.2*   < > 40.0 39.1 42.1  MCV 90.7  --   --  89.5 89.6  MCH 28.7  --   --  29.3 28.7  MCHC 31.7  --   --  32.7 32.1  RDW 16.4*  --   --  16.4* 16.3*  PLT 170  --   --  178 179   < > = values in this interval not displayed.    Cardiac Enzymes Recent Labs  Lab 08/26/19 1825 08/26/19 2034  TROPONINIHS 45* 43*    BNP Recent Labs  Lab 08/26/19 1825  BNP 1,615.0*     Radiology    CARDIAC CATHETERIZATION  Result Date: 08/30/2019  Prox RCA lesion is 100% stenosed. Left to right collaterals.  Mid Cx lesion is 25% stenosed.  Mid LAD lesion is 75% stenosed. A drug-eluting stent was successfully placed using a SYNERGY XD 3.0X28, postdilated to 3.5 mm proximally and optimized with IVUS.  Post intervention, there is a 0% residual stenosis.  1st Diag lesion is 100% stenosed. This was a chronic total occlusion. Balloon angioplasty was performed using a BALLOON SAPPHIRE 2.0X12.  Post intervention, there is a 40% residual stenosis. Vessel appears chronically underfilled compared to prior  films. Hopefully, this will improve over time.  LV end diastolic pressure is mildly elevated.  There is no aortic valve stenosis.  Hemodynamic findings consistent with mild pulmonary hypertension.  Ao sat 98%, PA sat 62%, mean PA pressure 31 mm Hg; mean PCWP 24 mm Hg; CO 4.9 L/min; CI 2.25  Balloon angioplasty was performed using a BALLOON SAPPHIRE 2.0X12.     Cardiac Studies   Echocardiogram 08/27/2019: 1. Left ventricular ejection fraction, by estimation, is 20 to 25%. The  left ventricle has severely decreased function. The left ventricle  demonstrates global hypokinesis. There is mild left ventricular  hypertrophy of the basal-septal segment. Left  ventricular diastolic parameters are consistent with Grade III diastolic  dysfunction (restrictive). Elevated left ventricular end-diastolic  pressure.  2. Right ventricular systolic function is severely reduced. The right  ventricular size is mildly enlarged.  3. Left atrial size was mild to moderately dilated.  4. Right atrial size was mildly dilated.  5. The mitral valve is grossly normal. Mild mitral valve regurgitation.  6. The aortic valve is tricuspid. Aortic valve regurgitation is not  visualized.  7. The inferior vena cava is dilated in size with <50% respiratory  variability, suggesting right atrial pressure of 15 mmHg.   Patient Profile     59 y.o. male with a history of CAD status post DES to the circumflex in the setting of STEMI in 2016, ischemic cardiomyopathy, paroxysmal atrial fibrillation, PAD, hypertension, hyperlipidemia, previous TIA, COPD, and tobacco abuse.  He is currently admitted with acute on chronic systolic heart failure with worsening LVEF.  Assessment & Plan    1.  CAD status post previous DES to the circumflex in 2016.  Now status post cardiac catheterization on May 14 demonstrating occluded proximal RCA with collaterals, 75% mid LAD stenosis treated with DES, and occluded first diagonal treated  with angioplasty.  Now on aspirin and Plavix (switched from Brilinta due to concurrent use of Eliquis at this point).  2.  Ischemic cardiomyopathy, most recent LVEF 20 to 25% range with restrictive diastolic filling pattern.  Right heart catheterization showed cardiac output and index  of 4.9 and 2.25 respectively.  Now on lisinopril, bisoprolol, Lasix and Aldactone.  3.  Paroxysmal atrial fibrillation with CHA2DS2-VASc score of 5.  Recurrent episode noted during this hospital stay but presently in sinus rhythm.  He has been started on Eliquis for stroke prophylaxis.  Discharge home today.  Recommend follow-up with Dr. Harl Bowie or APP in either [place] or [place] office in the next 7 days.  Continue aspirin, Plavix, Eliquis (anticipate one month course of all three and then stop aspirin), bisoprolol, lisinopril, Lasix, and Aldactone. Reduce KCl to 20 mEq daily at discharge.  He should get a BMET for his follow-up visit.  May ultimately be able to switch from lisinopril to [facility] as an outpatient.  Signed, Rozann Lesches, MD  09/01/2019, 8:48 AM

## 2019-09-01 NOTE — Progress Notes (Signed)
Pt being discharged, explained and discussed discharge instructions,prescriptions sent to pharmacy in [place]. Follow up appt with cardiologist to be made. Office will call him. Son unable to come and get him. Cab voucher given. Pt going home via cab.

## 2019-09-01 NOTE — Discharge Summary (Signed)
Discharge Summary    Patient ID: Eugene PingJoseph Marion Riemann Parker MRN: [id number]; DOB: 03/27/1961  Admit date: 08/26/2019 Discharge date: 09/01/2019  Primary Care Provider: Hart RobinsonsButler, Cynthia P, DO  Primary Cardiologist: Dina RichBranch, Jonathan, MD   Discharge Diagnoses    Principal Problem:   Acute exacerbation of CHF (congestive heart failure) [facility](HCC) Active Problems:   Current smoker   Essential hypertension   PVD (peripheral vascular disease) (HCC)   Acute systolic heart failure (HCC)   CAD S/P percutaneous coronary angioplasty  Diagnostic Studies/Procedures    Echocardiogram: 08/27/2019  1. Left ventricular ejection fraction, by estimation, is 20 to 25%. The  left ventricle has severely decreased function. The left ventricle  demonstrates global hypokinesis. There is mild left ventricular  hypertrophy of the basal-septal segment. Left  ventricular diastolic parameters are consistent with Grade III diastolic  dysfunction (restrictive). Elevated left ventricular end-diastolic  pressure.  2. Right ventricular systolic function is severely reduced. The right  ventricular size is mildly enlarged.  3. Left atrial size was mild to moderately dilated.  4. Right atrial size was mildly dilated.  5. The mitral valve is grossly normal. Mild mitral valve regurgitation.  6. The aortic valve is tricuspid. Aortic valve regurgitation is not  visualized.  7. The inferior vena cava is dilated in size with <50% respiratory  variability, suggesting right atrial pressure of 15 mmHg.  _____________   LHC 08/30/19:   Prox RCA lesion is 100% stenosed. Left to right collaterals.  Mid Cx lesion is 25% stenosed.  Mid LAD lesion is 75% stenosed. A drug-eluting stent was successfully placed using a SYNERGY XD 3.0X28, postdilated to 3.5 mm proximally and optimized with IVUS.  Post intervention, there is a 0% residual stenosis.  1st Diag lesion is 100% stenosed. This was a chronic total occlusion. Balloon  angioplasty was performed using a BALLOON SAPPHIRE 2.0X12.  Post intervention, there is a 40% residual stenosis. Vessel appears chronically underfilled compared to prior films. Hopefully, this will improve over time.  LV end diastolic pressure is mildly elevated.  There is no aortic valve stenosis.  Hemodynamic findings consistent with mild pulmonary hypertension.  Ao sat 98%, PA sat 62%, mean PA pressure 31 mm Hg; mean PCWP 24 mm Hg; CO 4.9 L/min; CI 2.25  Balloon angioplasty was performed using a BALLOON SAPPHIRE 2.0X12.  Diagnostic Dominance: Right  Intervention      History of Present Illness     Eugene Parker is a 59 y.o. male with past medical history of CAD (s/p STEMI in 06/2014 with DES to LCx), ICM with EF at 20-25% per echocardiogram 08/27/19, paroxysmal atrial fibrillation (previously on Eliquis), PAD (s/p R SFA stenting, right fem-pop bypass in 10/2017), HTN, HLD, prior TIA, COPD and tobacco use who was followed by cardiology for CHF, CAD with new DES/PCI placement.   Eugene Parker was last examined by Dr. Wyline MoodBranch in 09/2017 and denied any recent chest pain or dyspnea on exertion. He was being considered for femoropopliteal bypass and a stress test was recommended for risk stratification. This showed findings consistent with prior infarct with mild peri-infarct ischemia. EF was read as being reduced at 41% and an echocardiogram was obtained for clarification and showed EF was at 50 to 55% with grade 2 diastolic dysfunction. He underwent fem-pop bypass in 10/2017 with no immediate complications by review of the discharge summary. Was taking ASA 81mg  daily, Eliquis 5mg  BID, Atorvastatin 80mg  daily, Lisinopril 10mg  daily, Gabapentin 300mg  TID, PRN Xanax, Lopressor 25mg  in AM/37.5mg   in PM, Protonix, Percocet and PRN Albuterol.   He presented to [facility] ED on 08/26/2019 for evaluation of worsening dyspnea and lower extremity edema for the past 2 to 3 weeks. Two days prior to  hospital presentation, he developed more shortness of breath with exertion along with orthopnea and PND. He also reported discontinuing all of his medications approximately 2 years ago after grieving the loss of his wife.   Hospital Course     CXR showed cardiomegaly with pulmonary vascular congestion with a BNP of 1615. EKG showed NSR, HR 97 with PAC's and LBBB which is new when compared to tracings from 2019. Echocardiogram performed showed a reduced EF of 20-25% with global HK and grade 3 DD. RV function severely reduced as well. He was started on IV Lasix  BID. Plan was for transfer to [facility] for [facility]. His lisinopril was transitioned to Losartan for possible consideration for Entresto prior to discharge or at follow-up if BP allows. Not on BB given concern for low-output. Also consider Cleda Daub following cath if BP allows.    He was transferred to [facility] 08/30/19 and underwent LHC on 08/30/19 which showed 100% pRCA occlusion with left to right collaterals, mLCX at 25%, mLAD at 75% with DES/PCI placement. Also with 100% 1st Diag noted to be a chronic occlusion with balloon angioplasty performed. Right heart numbers:Ao sat 98%, PA sat 62%, mean PA pressure 31 mm Hg; mean PCWP 24 mm Hg; CO 4.9 L/min; CI 2.25. Initial plan was for DAPT with ASA and Brilinta  PO BID for a minimum of 1 year however Brilinta was ultimately transitioned to Plavix secondary to concurrent use of Eliquis. His Lopressor was also transitioned to Bisoprolol. IV Lasix was transitioned to PO on 08/31/19 in anticipation of discharge. Low dose Spironolactone was also initiated. See full plan below:  Recommend follow-up with Dr. Wyline Mood or APP in either [place] or [place] office in the next 7 days.  Continue aspirin, Plavix, Eliquis (anticipate one month course of all three and then stop aspirin), bisoprolol, lisinopril, Lasix, and Aldactone. Reduce KCl to 20 mEq daily at discharge.  He should get a BMET for his follow-up visit.  May  ultimately be able to switch from lisinopril to [facility] as an outpatient.  Hospital issues include:  -CAD status post previous DES to the circumflex in 2016.  Now status post cardiac catheterization on May 14 demonstrating occluded proximal RCA with collaterals, 75% mid LAD stenosis treated with DES, and occluded first diagonal treated with angioplasty.  Now on aspirin and Plavix (switched from Brilinta due to concurrent use of Eliquis at this point).  -Ischemic cardiomyopathy, most recent LVEF 20 to 25% range with restrictive diastolic filling pattern.  Right heart catheterization showed cardiac output and index of 4.9 and 2.25 respectively.  Now on lisinopril, bisoprolol, Lasix and Aldactone.  -Paroxysmal atrial fibrillation with CHA2DS2-VASc score of 5.  Recurrent episode noted during this hospital stay but presently in sinus rhythm.  He has been started on Eliquis for stroke prophylaxis.  -PAD-s/p R SFA stentingwithright fem-pop bypass in 10/2017. He has been restarted on ASA  daily and Atorvastatin  daily. Will need to follow-up with Vascular Surgery as an outpatient.   -Tobacco Use- he was smoking 1 ppd prior to admission. Cessation has been advised.    Consultants: None   The patient was seen and examined by Dr.McDowell who feels that he is stable and ready for discharge today, 09/01/19.   Did the patient have an acute  coronary syndrome (MI, NSTEMI, STEMI, etc) this admission?:  Yes                               AHA/ACC Clinical Performance & Quality Measures: 1. Aspirin prescribed? - Yes 2. ADP Receptor Inhibitor (Plavix/Clopidogrel, Brilinta/Ticagrelor or Effient/Prasugrel) prescribed (includes medically managed patients)? - Yes 3. Beta Blocker prescribed? - Yes 4. High Intensity Statin (Lipitor 40-80mg  or Crestor 20-40mg ) prescribed? - Yes 5. EF assessed during THIS hospitalization? - Yes 6. For EF <40%, was ACEI/ARB prescribed? - Yes 7. For EF <40%, Aldosterone  Antagonist (Spironolactone or Eplerenone) prescribed? - Yes 8. Cardiac Rehab Phase Parker ordered (Included Medically managed Patients)? - Yes  ____________  Discharge Vitals Blood pressure 101/67, pulse 84, temperature 97.9 F (36.6 C), temperature source Oral, resp. rate 19, height 5\' 11"  (1.803 m), weight 92.2 kg, SpO2 99 %.  Filed Weights   08/30/19 0440 08/31/19 0400 09/01/19 0332  Weight: 98.6 kg 92.4 kg 92.2 kg   Labs & Radiologic Studies    CBC Recent Labs    08/31/19 0245 09/01/19 0214  WBC 6.3 7.0  HGB 12.8* 13.5  HCT 39.1 42.1  MCV 89.5 89.6  PLT 178 179   Basic Metabolic Panel Recent Labs    09/03/19 0245 09/01/19 0214  NA 134* 135  K 3.3* 3.9  CL 99 101  CO2 25 25  GLUCOSE 119* 124*  BUN 17 18  CREATININE 1.13 1.25*  CALCIUM 8.4* 8.7*   High Sensitivity Troponin:   Recent Labs  Lab 08/26/19 1825 08/26/19 2034  TROPONINIHS 45* 43*    _____________  CARDIAC CATHETERIZATION  Result Date: 08/30/2019  Prox RCA lesion is 100% stenosed. Left to right collaterals.  Mid Cx lesion is 25% stenosed.  Mid LAD lesion is 75% stenosed. A drug-eluting stent was successfully placed using a SYNERGY XD 3.0X28, postdilated to 3.5 mm proximally and optimized with IVUS.  Post intervention, there is a 0% residual stenosis.  1st Diag lesion is 100% stenosed. This was a chronic total occlusion. Balloon angioplasty was performed using a BALLOON SAPPHIRE 2.0X12.  Post intervention, there is a 40% residual stenosis. Vessel appears chronically underfilled compared to prior films. Hopefully, this will improve over time.  LV end diastolic pressure is mildly elevated.  There is no aortic valve stenosis.  Hemodynamic findings consistent with mild pulmonary hypertension.  Ao sat 98%, PA sat 62%, mean PA pressure 31 mm Hg; mean PCWP 24 mm Hg; CO 4.9 L/min; CI 2.25  Balloon angioplasty was performed using a BALLOON SAPPHIRE 2.0X12.    DG Chest Portable 1 View  Result Date:  08/26/2019 CLINICAL DATA:  Shortness of breath and lower extremity swelling EXAM: PORTABLE CHEST 1 VIEW COMPARISON:  November 21, 2018 FINDINGS: There is mild cardiomegaly. Prominence of the central pulmonary vasculature is seen. There is a probable trace right pleural effusion. Aortic knob calcifications are seen. No large airspace consolidation is noted. No acute osseous abnormality. IMPRESSION: Stable cardiomegaly and pulmonary vascular congestion. Electronically Signed   By: November 23, 2018 M.D.   On: 08/26/2019 19:12   ECHOCARDIOGRAM COMPLETE  Result Date: 08/27/2019    ECHOCARDIOGRAM REPORT   Patient Name:   Eugene Parker Date of Exam: 08/27/2019 Medical Rec #:  10/27/2019              Height:       71.0 in Accession #:    [id number]  Weight:       216.0 lb Date of Birth:  Mar 25, 1961               BSA:          2.179 m Patient Age:    59 years               BP:           128/89 mmHg Patient Gender: M                      HR:           90 bpm. [facility] Procedure: 2D Echo Indications:    Congestive Heart Failure 428.0 / I50.9  History:        Patient has prior history of Echocardiogram examinations, most                 recent 10/06/2017. CHF, Previous Myocardial Infarction; Risk                 Factors:Current Smoker, Dyslipidemia and Hypertension.  Sonographer:    Jeryl Columbia RDCS (AE) Referring Phys: [id number] [facility] KHAN IMPRESSIONS  1. Left ventricular ejection fraction, by estimation, is 20 to 25%. The left ventricle has severely decreased function. The left ventricle demonstrates global hypokinesis. There is mild left ventricular hypertrophy of the basal-septal segment. Left ventricular diastolic parameters are consistent with Grade III diastolic dysfunction (restrictive). Elevated left ventricular end-diastolic pressure.  2. Right ventricular systolic function is severely reduced. The right ventricular size is mildly enlarged.  3. Left atrial size was mild to  moderately dilated.  4. Right atrial size was mildly dilated.  5. The mitral valve is grossly normal. Mild mitral valve regurgitation.  6. The aortic valve is tricuspid. Aortic valve regurgitation is not visualized.  7. The inferior vena cava is dilated in size with <50% respiratory variability, suggesting right atrial pressure of 15 mmHg. FINDINGS  Left Ventricle: Left ventricular ejection fraction, by estimation, is 20 to 25%. The left ventricle has severely decreased function. The left ventricle demonstrates global hypokinesis. The left ventricular internal cavity size was normal in size. There is mild left ventricular hypertrophy of the basal-septal segment. Abnormal (paradoxical) septal motion, consistent with left bundle branch block. Left ventricular diastolic parameters are consistent with Grade III diastolic dysfunction (restrictive). Elevated left ventricular end-diastolic pressure.  LV Wall Scoring: The posterior wall is hypokinetic. Right Ventricle: The right ventricular size is mildly enlarged. No increase in right ventricular wall thickness. Right ventricular systolic function is severely reduced. Left Atrium: Left atrial size was mild to moderately dilated. Right Atrium: Right atrial size was mildly dilated. Pericardium: There is no evidence of pericardial effusion. Mitral Valve: The mitral valve is grossly normal. Mild mitral annular calcification. Mild mitral valve regurgitation. Tricuspid Valve: The tricuspid valve is grossly normal. Tricuspid valve regurgitation is trivial. Aortic Valve: The aortic valve is tricuspid. . There is mild thickening of the aortic valve. Aortic valve regurgitation is not visualized. There is mild thickening of the aortic valve. Pulmonic Valve: The pulmonic valve was grossly normal. Pulmonic valve regurgitation is not visualized. Aorta: The aortic root is normal in size and structure. Venous: The inferior vena cava is dilated in size with less than 50% respiratory  variability, suggesting right atrial pressure of 15 mmHg. IAS/Shunts: No atrial level shunt detected by color flow Doppler. Prentice Docker MD Electronically signed by Prentice Docker MD Signature Date/Time: 08/27/2019/10:35:05 AM  Final    Disposition   Pt is being discharged home today in good condition.  Follow-up Plans & Appointments   Follow-up Information    Branch, Dorothe Pea, MD Follow up.   Specialty: Cardiology Why: the office will call with date and time of your appointment  Contact information: [address] [place] [address] [phone]          Discharge Instructions    AMB Referral to Cardiac Rehabilitation - Phase Parker   Complete by: As directed    Diagnosis: Coronary Stents   After initial evaluation and assessments completed: Virtual Based Care may be provided alone or in conjunction with Phase 2 Cardiac Rehab based on patient barriers.: Yes   Amb Referral to Cardiac Rehabilitation   Complete by: As directed    Diagnosis:  Coronary Stents Heart Failure (see criteria below if ordering Phase Parker)     Heart Failure Type: Chronic Systolic   After initial evaluation and assessments completed: Virtual Based Care may be provided alone or in conjunction with Phase 2 Cardiac Rehab based on patient barriers.: Yes   Call MD for:  difficulty breathing, headache or visual disturbances   Complete by: As directed    Call MD for:  extreme fatigue   Complete by: As directed    Call MD for:  hives   Complete by: As directed    Call MD for:  persistant dizziness or light-headedness   Complete by: As directed    Call MD for:  persistant nausea and vomiting   Complete by: As directed    Call MD for:  redness, tenderness, or signs of infection (pain, swelling, redness, odor or green/yellow discharge around incision site)   Complete by: As directed    Call MD for:  severe uncontrolled pain   Complete by: As directed    Call MD for:  temperature >100.4   Complete  by: As directed    Diet - low sodium heart healthy   Complete by: As directed    Discharge instructions   Complete by: As directed    PLEASE DO NOT MISS ANY DOSES OF YOUR PLAVIX!!!!! Also keep a log of you blood pressures and bring back to your follow up appt. Please call the office with any questions.   Patients taking blood thinners should generally stay away from medicines like ibuprofen, Advil, Motrin, naproxen, and Aleve due to risk of stomach bleeding. You may take Tylenol as directed or talk to your primary doctor about alternatives.  No driving for 6-5YYTK. No lifting over 5 lbs for 1 week. No sexual activity for 1 week. Keep procedure site clean & dry. If you notice increased pain, swelling, bleeding or pus, call/return!  You may shower, but no soaking baths/hot tubs/pools for 1 week.   Increase activity slowly   Complete by: As directed       Discharge Medications   Allergies as of 09/01/2019      Reactions   Codeine Nausea Only      Medication List    TAKE these medications   apixaban 5 MG Tabs tablet Commonly known as: ELIQUIS Take 1 tablet (5 mg total) by mouth 2 (two) times daily.   aspirin 81 MG chewable tablet Chew 1 tablet (81 mg total) by mouth daily. Start taking on: Sep 02, 2019   atorvastatin 40 MG tablet Commonly known as: LIPITOR Take 1 tablet (40 mg total) by mouth daily at 6 PM.   bisoprolol 5 MG tablet  Commonly known as: ZEBETA Take 1 tablet (5 mg total) by mouth daily. Start taking on: Sep 02, 2019   clopidogrel 75 MG tablet Commonly known as: PLAVIX Take 1 tablet (75 mg total) by mouth daily. Start taking on: Sep 02, 2019   furosemide 40 MG tablet Commonly known as: LASIX Take 1 tablet (40 mg total) by mouth daily. Start taking on: Sep 02, 2019   lisinopril 10 MG tablet Commonly known as: ZESTRIL Take 1 tablet (10 mg total) by mouth daily. Start taking on: Sep 02, 2019   nitroGLYCERIN 0.4 MG SL tablet Commonly known as:  NITROSTAT DISSOLVE ONE TABLET UNDER TONGUE EVERY 5 MINUTES UP TO 3 DOSES AS NEEDED FOR CHEST PAIN What changed: See the new instructions.   Potassium Chloride ER 20 MEQ Tbcr Take 20 mEq by mouth daily. Start taking on: Sep 02, 2019   spironolactone 25 MG tablet Commonly known as: ALDACTONE Take 0.5 tablets (12.5 mg total) by mouth daily. Start taking on: Sep 02, 2019   Ventolin HFA 108 (90 Base) MCG/ACT inhaler Generic drug: albuterol INHALE BY MOUTH AS DIRECTED What changed: See the new instructions.       Outstanding Labs/Studies   BMET at follow up   Duration of Discharge Encounter   Greater than 30 minutes including physician time.  Signed, Kathyrn Drown, NP 09/01/2019, 10:03 AM

## 2019-09-04 ENCOUNTER — Ambulatory Visit: Payer: Medicare Other | Admitting: Cardiology

## 2019-09-04 NOTE — Progress Notes (Deleted)
Clinical Summary Mr. Tews is a 59 y.o.male  1. CAD/ICM/Chronic systolic HF - hx of inferior STEMI 06/2014, received DES to LCX.  - echo 11/2014 LVEF 55-60%   08/2019 echo: LVEF 20-25%, grade III DDX, severe RV dysfunction.  08/2019 cath: mid LAD 75%, D1 100%, LCX 25%, RCA prox 100% with left to right collaterals - s/p DES to mid LAD lesion. PTCA to D1. PCWP 24, mean PA 31, CI 2.25, LVEDP 20 - discharged with plans for 1 month triple therapy, then stop ASA.    ?labs??  2. COPD - abnormal PFTs 03/2015,. Started on albuterol prn with improved symptoms. No recent symptoms.  3. PAD - followed by vascular - history of right footwound, had prior right SFA stenting that became occluded.    4. Afib - seen 02/06/17 note by PA Dunn. Patient presented for lexiscan, found to be in afib with RVR. Test cancelled CHADS2Vasc score is (CAD/PAD, HTN, CVA??)  - no recent palpitations.   5. Preoperative evaluation - being considered forright femoropopliteal bypass. Also request a request for clearance for a dental surgery -no recent chest pain, no SOB - limited by leg and hip pain, cannot walk a block or flight of stairs.    SH: wife passed 15 months Past Medical History:  Diagnosis Date  . Anxiety   . CAD in native artery    a. STEMI 06/2014 s/p DES to Cx.  . Cellulitis and abscess of foot 03/22/2016  . Collagen vascular disease ([place])   . Current smoker   . Dental caries    periodontitis  . DJD (degenerative joint disease), lumbosacral   . Essential hypertension   . Heart attack ([place])   . Hiccups   . Hyperlipemia   . PAD (peripheral artery disease) ([place])   . Stroke (cerebrum) ([facility])      Allergies  Allergen Reactions  . Codeine Nausea Only     Current Outpatient Medications  Medication Sig Dispense Refill  . apixaban (ELIQUIS) 5 MG TABS tablet Take 1 tablet (5 mg total) by mouth 2 (two) times daily. 120 tablet 2  . aspirin 81 MG chewable tablet Chew  1 tablet (81 mg total) by mouth daily.    Marland Kitchen atorvastatin (LIPITOR) 40 MG tablet Take 1 tablet (40 mg total) by mouth daily at 6 PM. 60 tablet 2  . bisoprolol (ZEBETA) 5 MG tablet Take 1 tablet (5 mg total) by mouth daily. 60 tablet 2  . clopidogrel (PLAVIX) 75 MG tablet Take 1 tablet (75 mg total) by mouth daily. 60 tablet 2  . furosemide (LASIX) 40 MG tablet Take 1 tablet (40 mg total) by mouth daily. 60 tablet 2  . lisinopril (ZESTRIL) 10 MG tablet Take 1 tablet (10 mg total) by mouth daily. 60 tablet 2  . nitroGLYCERIN (NITROSTAT) 0.4 MG SL tablet DISSOLVE ONE TABLET UNDER TONGUE EVERY 5 MINUTES UP TO 3 DOSES AS NEEDED FOR CHEST PAIN (Patient taking differently: Place 0.4 mg under the tongue every 5 (five) minutes as needed. ) 25 tablet 2  . potassium chloride 20 MEQ TBCR Take 20 mEq by mouth daily. 60 tablet 2  . spironolactone (ALDACTONE) 25 MG tablet Take 0.5 tablets (12.5 mg total) by mouth daily. 60 tablet 2  . VENTOLIN HFA 108 (90 Base) MCG/ACT inhaler INHALE BY MOUTH AS DIRECTED (Patient taking differently: Inhale 2 puffs into the lungs every 6 (six) hours as needed for wheezing or shortness of breath. ) 18 g 1  No current facility-administered medications for this visit.     Past Surgical History:  Procedure Laterality Date  . ABDOMINAL AORTOGRAM W/LOWER EXTREMITY N/A 12/21/2016   Procedure: ABDOMINAL AORTOGRAM W/LOWER EXTREMITY;  Surgeon: Waynetta Sandy, MD;  Location: [facility];  Service: Cardiovascular;  Laterality: N/A;  . ABDOMINAL AORTOGRAM W/LOWER EXTREMITY N/A 11/01/2017   Procedure: ABDOMINAL AORTOGRAM W/LOWER EXTREMITY;  Surgeon: Waynetta Sandy, MD;  Location: [facility];  Service: Cardiovascular;  Laterality: N/A;  . CORONARY STENT INTERVENTION N/A 08/30/2019   Procedure: CORONARY STENT INTERVENTION;  Surgeon: Jettie Booze, MD;  Location: [facility];  Service: Cardiovascular;  Laterality: N/A;  . ENDARTERECTOMY FEMORAL  Right 11/02/2017   Procedure: ENDARTERECTOMY RIGHT FEMORAL ARTERY;  Surgeon: Waynetta Sandy, MD;  Location: [facility];  Service: Vascular;  Laterality: Right;  . FEMORAL-POPLITEAL BYPASS GRAFT Right 11/02/2017   Procedure: BYPASS GRAFT RIGHT FEMORAL TO BELOW KNEE POPLITEAL ARTERY USING RIGHT GREAT SAPHENOUS VEIN ;  Surgeon: Waynetta Sandy, MD;  Location: [facility];  Service: Vascular;  Laterality: Right;  . INTRADISCAL INJECTION  Oct 2012   L5-S1  . INTRAVASCULAR ULTRASOUND/IVUS N/A 08/30/2019   Procedure: Intravascular Ultrasound/IVUS;  Surgeon: Jettie Booze, MD;  Location: [facility];  Service: Cardiovascular;  Laterality: N/A;  . LEFT HEART CATHETERIZATION WITH CORONARY ANGIOGRAM N/A 07/04/2014   Procedure: LEFT HEART CATHETERIZATION WITH CORONARY ANGIOGRAM;  Surgeon: Lorretta Harp, MD;  Location: [facility];  Service: Cardiovascular;  Laterality: N/A;  . LEG SURGERY    . PERIPHERAL VASCULAR CATHETERIZATION N/A 02/03/2016   Procedure: Abdominal Aortogram;  Surgeon: Waynetta Sandy, MD;  Location: [facility];  Service: Cardiovascular;  Laterality: N/A;  . PERIPHERAL VASCULAR CATHETERIZATION N/A 02/03/2016   Procedure: Lower Extremity Angiography;  Surgeon: Waynetta Sandy, MD;  Location: [facility];  Service: Cardiovascular;  Laterality: N/A;  . PERIPHERAL VASCULAR CATHETERIZATION Right 02/03/2016   Procedure: Peripheral Vascular Intervention;  Surgeon: Waynetta Sandy, MD;  Location: [facility] [facility];  Service: Cardiovascular;  Laterality: Right;  SFA  . RIGHT/LEFT HEART CATH AND CORONARY ANGIOGRAPHY N/A 08/30/2019   Procedure: RIGHT/LEFT HEART CATH AND CORONARY ANGIOGRAPHY;  Surgeon: Jettie Booze, MD;  Location: [facility];  Service: Cardiovascular;  Laterality: N/A;  . TOOTH EXTRACTION N/A 02/02/2018   Procedure: DENTAL RESTORATION/EXTRACTIONS;  Surgeon: Diona Browner, DDS;  Location: [facility];  Service:  Oral Surgery;  Laterality: N/A;  . VEIN HARVEST Right 11/02/2017   Procedure: VEIN HARVEST RIGHT GREAT SAPHENOUS VEIN;  Surgeon: Waynetta Sandy, MD;  Location: [facility];  Service: Vascular;  Laterality: Right;     Allergies  Allergen Reactions  . Codeine Nausea Only      Family History  Problem Relation Age of Onset  . Heart disease Mother   . COPD Mother   . Heart disease Father      Social History Mr. Kirn reports that he has been smoking cigarettes. He started smoking about 49 years ago. He has a 40.00 pack-year smoking history. He has never used smokeless tobacco. Mr. Hawes reports current alcohol use.   Review of Systems CONSTITUTIONAL: No weight loss, fever, chills, weakness or fatigue.  HEENT: Eyes: No visual loss, blurred vision, double vision or yellow sclerae.No hearing loss, sneezing, congestion, runny nose or sore throat.  SKIN: No rash or itching.  CARDIOVASCULAR:  RESPIRATORY: No shortness of breath, cough or sputum.  GASTROINTESTINAL: No anorexia, nausea, vomiting  or diarrhea. No abdominal pain or blood.  GENITOURINARY: No burning on urination, no polyuria NEUROLOGICAL: No headache, dizziness, syncope, paralysis, ataxia, numbness or tingling in the extremities. No change in bowel or bladder control.  MUSCULOSKELETAL: No muscle, back pain, joint pain or stiffness.  LYMPHATICS: No enlarged nodes. No history of splenectomy.  PSYCHIATRIC: No history of depression or anxiety.  ENDOCRINOLOGIC: No reports of sweating, cold or heat intolerance. No polyuria or polydipsia.  Marland Kitchen   Physical Examination There were no vitals filed for this visit. There were no vitals filed for this visit.  Gen: resting comfortably, no acute distress HEENT: no scleral icterus, pupils equal round and reactive, no palptable cervical adenopathy,  CV Resp: Clear to auscultation bilaterally GI: abdomen is soft, non-tender, non-distended, normal bowel sounds, no  hepatosplenomegaly MSK: extremities are warm, no edema.  Skin: warm, no rash Neuro:  no focal deficits Psych: appropriate affect   Diagnostic Studies 06/2014 Cath HEMODYNAMICS:   AO SYSTOLIC/AO DIASTOLIC: 462/70  LV SYSTOLIC/LV DIASTOLIC: 350/09  ANGIOGRAPHIC RESULTS:   1. Left main; normal  2. LAD; minor irregularities 3. Left circumflex; codominant with occluded mid AV groove after OM 3.  4. Right coronary artery; codominant with 40-50% segmental mid 7. Left ventriculography; RAO left ventriculogram was performed using  25 mL of Visipaque dye at 12 mL/second. The overall LVEF estimated  45-50 % With wall motion abnormalities moderate inferoapical and posterolateral hypokinesia  IMPRESSION:Mr. Essman has been occluded codominant circumflex with otherwise noncritical CAD. We will proceed with direct intervention using Angiomax, Brilenta, and drug eluting stent.  Procedure description: The patient received Angiomax bolus and infusion with an ACT of 435. A total of 185 mL of contrast was administered to the patient. Using a 6 [place] XB 3.5 similar guide catheter along with a 1/190 cm long pro-water guidewire and a 2 mm x 12 mm Euphora balloon the lesion was crossed and angioplasty performed. The door to balloon time was 26 minutes. Following this A 2.75 mm x 23 mm long Xience drug-eluting stent was then carefully positioned and deployed at 12 atm and postdilated with a 3 mm x 20 mm long [place] Euphora balloon at 15 atm (3.1 mm) resulting in reduction with total occlusion to 0% residual with TIMI-3 flow and no dissection. The patient tolerated the procedure well. There are no hemodynamic or electrocardiographic sequela. The guidewire and catheter were removed and the sheath was secured.   Final impression: successful PCI and stenting of a occluded mid codominant circumflex in the setting of an acute inferolateral myocardial infarction with a door to balloon time 26 minutes using  Angiomax, Proventil and drug-eluting stent. The patient had noncritical disease otherwise with preserved LV function. He'll be treated with usual medications including beta blocker, statin drug and ACE inhibitor. His sheath will be removed in several hours and pressure held. He will be fast tracked with anticipated discharge in 48-72 hours. He left the lab in stable condition.   11/2014 echo Study Conclusions  - Left ventricle: The cavity size was normal. Wall thickness was increased in a pattern of mild LVH. Systolic function was normal. The estimated ejection fraction was in the range of 55% to 60%. Images were inadequate for LV wall motion assessment. However, no gross regional variation on available images. Diastolic dysfunction, grade indeterminate. - Aortic valve: Mildly to moderately calcified annulus. Mildly calcified leaflets. - Mitral valve: There was trivial regurgitation.  03/2015 PFTs: technically difficult due to poor patient cooperation. Was some evidence of obstruction.  Assessment and Plan   1. CAD - no recent symptoms - will require stress testing prior to upcoming surgery for risk stratification.   2. Preoperative evaluation - being considered for vascular surgery -poor exercise tolerance, primarily due to leg pains - we will reorder lexiscsn stress for him.   3. PAD/Preoperative evaluation - possible lower extremity bypass, await nuclear stress results regarding cardiac clearance.  4. Afib - new diagnosis, noted at time of prior stress test. Test was cancelled as patient was in afib with RVR, new finding for him - EKG today shows sinus rhythm - stop aspirin, start eliquis. Would need to hold 3 days prior to vascular surgery.   Clearance pending results of nuclear stress test      Arnoldo Lenis, M.D., F.A.C.C.
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
## 2019-09-11 ENCOUNTER — Other Ambulatory Visit: Payer: Self-pay

## 2019-09-11 ENCOUNTER — Encounter: Payer: Self-pay | Admitting: Family Medicine

## 2019-09-11 ENCOUNTER — Ambulatory Visit (INDEPENDENT_AMBULATORY_CARE_PROVIDER_SITE_OTHER): Payer: Medicare Other | Admitting: Family Medicine

## 2019-09-11 VITALS — BP 130/80 | HR 74 | Ht 71.0 in | Wt 215.6 lb

## 2019-09-11 DIAGNOSIS — I10 Essential (primary) hypertension: Secondary | ICD-10-CM

## 2019-09-11 DIAGNOSIS — I5021 Acute systolic (congestive) heart failure: Secondary | ICD-10-CM | POA: Diagnosis not present

## 2019-09-11 DIAGNOSIS — I255 Ischemic cardiomyopathy: Secondary | ICD-10-CM

## 2019-09-11 DIAGNOSIS — I2510 Atherosclerotic heart disease of native coronary artery without angina pectoris: Secondary | ICD-10-CM | POA: Diagnosis not present

## 2019-09-11 DIAGNOSIS — I779 Disorder of arteries and arterioles, unspecified: Secondary | ICD-10-CM

## 2019-09-11 DIAGNOSIS — F17200 Nicotine dependence, unspecified, uncomplicated: Secondary | ICD-10-CM

## 2019-09-11 DIAGNOSIS — I480 Paroxysmal atrial fibrillation: Secondary | ICD-10-CM | POA: Diagnosis not present

## 2019-09-11 MED ORDER — ISOSORBIDE MONONITRATE ER 30 MG PO TB24
30.0000 mg | ORAL_TABLET | Freq: Every day | ORAL | 3 refills | Status: DC
Start: 2019-09-11 — End: 2020-07-24

## 2019-09-11 NOTE — Addendum Note (Signed)
Addended by: Audry Pili on: 09/11/2019 10:37 AM   Modules accepted: Level of Service

## 2019-09-11 NOTE — Patient Instructions (Signed)
Medication Instructions:  Your physician has recommended you make the following change in your medication:  Start Imdur 30 mg Daily   *If you need a refill on your cardiac medications before your next appointment, please call your pharmacy*   Lab Work: NONE   If you have labs (blood work) drawn today and your tests are completely normal, you will receive your results only by: Marland Kitchen MyChart Message (if you have MyChart) OR . A paper copy in the mail If you have any lab test that is abnormal or we need to change your treatment, we will call you to review the results.   Testing/Procedures: NONE   Follow-Up: At [facility], you and your health needs are our priority.  As part of our continuing mission to provide you with exceptional heart care, we have created designated Provider Care Teams.  These Care Teams include your primary Cardiologist (physician) and Advanced Practice Providers (APPs -  Physician Assistants and Nurse Practitioners) who all work together to provide you with the care you need, when you need it.  We recommend signing up for the patient portal called "MyChart".  Sign up information is provided on this After Visit Summary.  MyChart is used to connect with patients for Virtual Visits (Telemedicine).  Patients are able to view lab/test results, encounter notes, upcoming appointments, etc.  Non-urgent messages can be sent to your provider as well.   To learn more about what you can do with MyChart, go to [email or web address].    Your next appointment:   3 month(s)  The format for your next appointment:   In Person  Provider:   Dina Rich, MD   Other Instructions Thank you for choosing [facility]! '

## 2019-10-17 ENCOUNTER — Emergency Department (HOSPITAL_COMMUNITY): Payer: Medicare Other

## 2019-10-17 ENCOUNTER — Encounter (HOSPITAL_COMMUNITY): Payer: Self-pay

## 2019-10-17 ENCOUNTER — Observation Stay (HOSPITAL_COMMUNITY)
Admission: EM | Admit: 2019-10-17 | Discharge: 2019-10-18 | Disposition: A | Discharge status: Home / Self Care | Payer: Medicare Other | Attending: Internal Medicine | Admitting: Internal Medicine

## 2019-10-17 ENCOUNTER — Other Ambulatory Visit: Payer: Self-pay

## 2019-10-17 DIAGNOSIS — I5020 Unspecified systolic (congestive) heart failure: Secondary | ICD-10-CM | POA: Diagnosis not present

## 2019-10-17 DIAGNOSIS — Z79899 Other long term (current) drug therapy: Secondary | ICD-10-CM | POA: Insufficient documentation

## 2019-10-17 DIAGNOSIS — Z20822 Contact with and (suspected) exposure to covid-19: Secondary | ICD-10-CM | POA: Insufficient documentation

## 2019-10-17 DIAGNOSIS — I110 Hypertensive heart disease with heart failure: Secondary | ICD-10-CM | POA: Insufficient documentation

## 2019-10-17 DIAGNOSIS — I200 Unstable angina: Secondary | ICD-10-CM | POA: Diagnosis not present

## 2019-10-17 DIAGNOSIS — Z7902 Long term (current) use of antithrombotics/antiplatelets: Secondary | ICD-10-CM | POA: Diagnosis not present

## 2019-10-17 DIAGNOSIS — E876 Hypokalemia: Secondary | ICD-10-CM | POA: Diagnosis not present

## 2019-10-17 DIAGNOSIS — Z7982 Long term (current) use of aspirin: Secondary | ICD-10-CM | POA: Diagnosis not present

## 2019-10-17 DIAGNOSIS — L97519 Non-pressure chronic ulcer of other part of right foot with unspecified severity: Secondary | ICD-10-CM | POA: Diagnosis not present

## 2019-10-17 DIAGNOSIS — Z8673 Personal history of transient ischemic attack (TIA), and cerebral infarction without residual deficits: Secondary | ICD-10-CM | POA: Insufficient documentation

## 2019-10-17 DIAGNOSIS — E871 Hypo-osmolality and hyponatremia: Secondary | ICD-10-CM | POA: Diagnosis not present

## 2019-10-17 DIAGNOSIS — R066 Hiccough: Secondary | ICD-10-CM | POA: Diagnosis not present

## 2019-10-17 DIAGNOSIS — F17210 Nicotine dependence, cigarettes, uncomplicated: Secondary | ICD-10-CM | POA: Insufficient documentation

## 2019-10-17 DIAGNOSIS — R072 Precordial pain: Secondary | ICD-10-CM | POA: Diagnosis present

## 2019-10-17 DIAGNOSIS — I739 Peripheral vascular disease, unspecified: Secondary | ICD-10-CM | POA: Insufficient documentation

## 2019-10-17 LAB — SARS CORONAVIRUS 2 BY RT PCR (HOSPITAL ORDER, PERFORMED IN ~~LOC~~ HOSPITAL LAB): SARS Coronavirus 2: NEGATIVE

## 2019-10-17 LAB — HEPATIC FUNCTION PANEL
ALT: 15 U/L (ref 0–44)
AST: 22 U/L (ref 15–41)
Albumin: 3 g/dL — ABNORMAL LOW (ref 3.5–5.0)
Alkaline Phosphatase: 91 U/L (ref 38–126)
Bilirubin, Direct: 0.6 mg/dL — ABNORMAL HIGH (ref 0.0–0.2)
Indirect Bilirubin: 0.6 mg/dL (ref 0.3–0.9)
Total Bilirubin: 1.2 mg/dL (ref 0.3–1.2)
Total Protein: 7.4 g/dL (ref 6.5–8.1)

## 2019-10-17 LAB — CBC
HCT: 38.9 % — ABNORMAL LOW (ref 39.0–52.0)
Hemoglobin: 12.2 g/dL — ABNORMAL LOW (ref 13.0–17.0)
MCH: 28.5 pg (ref 26.0–34.0)
MCHC: 31.4 g/dL (ref 30.0–36.0)
MCV: 90.9 fL (ref 80.0–100.0)
Platelets: 222 10*3/uL (ref 150–400)
RBC: 4.28 MIL/uL (ref 4.22–5.81)
RDW: 17.7 % — ABNORMAL HIGH (ref 11.5–15.5)
WBC: 8.7 10*3/uL (ref 4.0–10.5)
nRBC: 0 % (ref 0.0–0.2)

## 2019-10-17 LAB — TROPONIN I (HIGH SENSITIVITY)
Troponin I (High Sensitivity): 23 ng/L — ABNORMAL HIGH (ref <18)
Troponin I (High Sensitivity): 26 ng/L — ABNORMAL HIGH (ref <18)

## 2019-10-17 LAB — BASIC METABOLIC PANEL
Anion gap: 10 (ref 5–15)
BUN: 9 mg/dL (ref 8–23)
CO2: 21 mmol/L — ABNORMAL LOW (ref 22–32)
Calcium: 8.2 mg/dL — ABNORMAL LOW (ref 8.9–10.3)
Chloride: 100 mmol/L (ref 98–111)
Creatinine, Ser: 1.02 mg/dL (ref 0.61–1.24)
GFR calc Af Amer: >60 mL/min (ref >60)
GFR calc non Af Amer: >60 mL/min (ref >60)
Glucose, Bld: 138 mg/dL — ABNORMAL HIGH (ref 70–99)
Potassium: 2.9 mmol/L — ABNORMAL LOW (ref 3.5–5.1)
Sodium: 131 mmol/L — ABNORMAL LOW (ref 135–145)

## 2019-10-17 MED ORDER — POTASSIUM CHLORIDE 10 MEQ/100ML IV SOLN
INTRAVENOUS | Status: AC
  Filled 2019-10-17: qty 100

## 2019-10-17 MED ORDER — NITROGLYCERIN 0.4 MG SL SUBL
0.4000 mg | SUBLINGUAL_TABLET | SUBLINGUAL | Status: DC | PRN
  Administered 2019-10-17: 0.4 mg via SUBLINGUAL
  Filled 2019-10-17: qty 1

## 2019-10-17 MED ORDER — POTASSIUM CHLORIDE 10 MEQ/100ML IV SOLN
10.0000 meq | Freq: Once | INTRAVENOUS | Status: AC
  Administered 2019-10-17: 10 meq via INTRAVENOUS

## 2019-10-17 MED ORDER — PANTOPRAZOLE SODIUM 40 MG IV SOLR
40.0000 mg | Freq: Once | INTRAVENOUS | Status: AC
  Administered 2019-10-17: 40 mg via INTRAVENOUS
  Filled 2019-10-17: qty 40

## 2019-10-17 MED ORDER — SODIUM CHLORIDE 0.9% FLUSH
3.0000 mL | Freq: Once | INTRAVENOUS | Status: AC
  Administered 2019-10-17: 3 mL via INTRAVENOUS

## 2019-10-17 MED ORDER — SODIUM CHLORIDE 0.9 % IV BOLUS
500.0000 mL | Freq: Once | INTRAVENOUS | Status: AC
  Administered 2019-10-17: 500 mL via INTRAVENOUS

## 2019-10-17 MED ORDER — POTASSIUM CHLORIDE 10 MEQ/100ML IV SOLN
10.0000 meq | Freq: Once | INTRAVENOUS | Status: AC
  Administered 2019-10-17: 10 meq via INTRAVENOUS
  Filled 2019-10-17: qty 100

## 2019-10-17 NOTE — ED Triage Notes (Signed)
Pt presents to ED with complaints of mid chest pain and SOB started today accompanied with hiccups.

## 2019-10-17 NOTE — ED Notes (Signed)
Placed pt. On 2 L of O2 to help with the RR.

## 2019-10-17 NOTE — ED Provider Notes (Signed)
[facility] EMERGENCY DEPARTMENT Provider Note   CSN: [id number] Arrival date & time: 10/17/19  1554     History Chief Complaint  Patient presents with  . Chest Pain    Eugene Parker is a 59 y.o. male.  Patient complains of chest pain and hiccups.  Patient had a stent placed in his heart couple months ago when he had the same symptoms.  Patient had a stent placed a few years ago also  The history is provided by the patient and medical records. No language interpreter was used.  Chest Pain Pain location:  Substernal area Pain quality: aching   Pain radiates to:  Does not radiate Pain severity:  Moderate Onset quality:  Sudden Timing:  Constant Progression:  Worsening Chronicity:  New Context: not breathing   Relieved by:  Nothing Worsened by:  Nothing Ineffective treatments:  None tried Associated symptoms: no abdominal pain, no back pain, no cough, no fatigue and no headache        Past Medical History:  Diagnosis Date  . Anxiety   . CAD in native artery    a. STEMI 06/2014 s/p DES to Cx.  . Cellulitis and abscess of foot 03/22/2016  . Collagen vascular disease (HCC)   . Current smoker   . Dental caries    periodontitis  . DJD (degenerative joint disease), lumbosacral   . Essential hypertension   . Heart attack (HCC)   . Hiccups   . Hyperlipemia   . PAD (peripheral artery disease) (HCC)   . Stroke (cerebrum) [facility])     Patient Active Problem List   Diagnosis Date Noted  . CAD S/P percutaneous coronary angioplasty 09/01/2019  . Acute systolic heart failure (HCC)   . Acute exacerbation of CHF (congestive heart failure) (HCC) 08/26/2019  . PAD (peripheral artery disease) (HCC) 11/01/2017  . Cellulitis and abscess of foot 03/22/2016  . PVD (peripheral vascular disease) (HCC) 03/22/2016  . Hyperlipidemia 07/06/2014  . Elevated LFTs 07/06/2014  . ST elevation myocardial infarction (STEMI) of inferior wall (HCC) 07/04/2014  . Current smoker 07/04/2014   . Essential hypertension 07/04/2014    Past Surgical History:  Procedure Laterality Date  . ABDOMINAL AORTOGRAM W/LOWER EXTREMITY N/A 12/21/2016   Procedure: ABDOMINAL AORTOGRAM W/LOWER EXTREMITY;  Surgeon: Maeola Harman, MD;  Location: [facility] INVASIVE CV LAB;  Service: Cardiovascular;  Laterality: N/A;  . ABDOMINAL AORTOGRAM W/LOWER EXTREMITY N/A 11/01/2017   Procedure: ABDOMINAL AORTOGRAM W/LOWER EXTREMITY;  Surgeon: Maeola Harman, MD;  Location: [facility] INVASIVE CV LAB;  Service: Cardiovascular;  Laterality: N/A;  . CORONARY STENT INTERVENTION N/A 08/30/2019   Procedure: CORONARY STENT INTERVENTION;  Surgeon: Corky Crafts, MD;  Location: [facility] INVASIVE CV LAB;  Service: Cardiovascular;  Laterality: N/A;  . ENDARTERECTOMY FEMORAL Right 11/02/2017   Procedure: ENDARTERECTOMY RIGHT FEMORAL ARTERY;  Surgeon: Maeola Harman, MD;  Location: [facility] OR;  Service: Vascular;  Laterality: Right;  . FEMORAL-POPLITEAL BYPASS GRAFT Right 11/02/2017   Procedure: BYPASS GRAFT RIGHT FEMORAL TO BELOW KNEE POPLITEAL ARTERY USING RIGHT GREAT SAPHENOUS VEIN ;  Surgeon: Maeola Harman, MD;  Location: [facility] OR;  Service: Vascular;  Laterality: Right;  . INTRADISCAL INJECTION  Oct 2012   L5-S1  . INTRAVASCULAR ULTRASOUND/IVUS N/A 08/30/2019   Procedure: Intravascular Ultrasound/IVUS;  Surgeon: Corky Crafts, MD;  Location: [facility] INVASIVE CV LAB;  Service: Cardiovascular;  Laterality: N/A;  . LEFT HEART CATHETERIZATION WITH CORONARY ANGIOGRAM N/A 07/04/2014   Procedure: LEFT HEART CATHETERIZATION WITH CORONARY ANGIOGRAM;  Surgeon: Runell Gess, MD;  Location: [facility] CATH LAB;  Service: Cardiovascular;  Laterality: N/A;  . LEG SURGERY    . PERIPHERAL VASCULAR CATHETERIZATION N/A 02/03/2016   Procedure: Abdominal Aortogram;  Surgeon: Maeola Harman, MD;  Location: [facility] INVASIVE CV LAB;  Service: Cardiovascular;  Laterality: N/A;  . PERIPHERAL VASCULAR CATHETERIZATION N/A  02/03/2016   Procedure: Lower Extremity Angiography;  Surgeon: Maeola Harman, MD;  Location: [facility] INVASIVE CV LAB;  Service: Cardiovascular;  Laterality: N/A;  . PERIPHERAL VASCULAR CATHETERIZATION Right 02/03/2016   Procedure: Peripheral Vascular Intervention;  Surgeon: Maeola Harman, MD;  Location: [facility] INVASIVE CV LAB;  Service: Cardiovascular;  Laterality: Right;  SFA  . RIGHT/LEFT HEART CATH AND CORONARY ANGIOGRAPHY N/A 08/30/2019   Procedure: RIGHT/LEFT HEART CATH AND CORONARY ANGIOGRAPHY;  Surgeon: Corky Crafts, MD;  Location: [facility] INVASIVE CV LAB;  Service: Cardiovascular;  Laterality: N/A;  . TOOTH EXTRACTION N/A 02/02/2018   Procedure: DENTAL RESTORATION/EXTRACTIONS;  Surgeon: Ocie Doyne, DDS;  Location: [facility] OR;  Service: Oral Surgery;  Laterality: N/A;  . VEIN HARVEST Right 11/02/2017   Procedure: VEIN HARVEST RIGHT GREAT SAPHENOUS VEIN;  Surgeon: Maeola Harman, MD;  Location: [facility] OR;  Service: Vascular;  Laterality: Right;       Family History  Problem Relation Age of Onset  . Heart disease Mother   . COPD Mother   . Heart disease Father     Social History   Tobacco Use  . Smoking status: Current Every Day Smoker    Packs/day: 0.50    Years: 40.00    Pack years: 20.00    Types: Cigarettes    Start date: 04/22/1970  . Smokeless tobacco: Never Used  . Tobacco comment: 1 pack per 2 days  Vaping Use  . Vaping Use: Never used  Substance Use Topics  . Alcohol use: Yes    Alcohol/week: 0.0 standard drinks    Comment: occasisional beer  . Drug use: Not Currently    Types: Marijuana    Home Medications Prior to Admission medications   Medication Sig Start Date End Date Taking? Authorizing Provider  apixaban (ELIQUIS) 5 MG TABS tablet Take 1 tablet (5 mg total) by mouth 2 (two) times daily. 09/01/19   Georgie Chard D, NP  aspirin 81 MG chewable tablet Chew 1 tablet (81 mg total) by mouth daily. 09/02/19   Filbert Schilder, NP    atorvastatin (LIPITOR) 40 MG tablet Take 1 tablet (40 mg total) by mouth daily at 6 PM. 09/01/19   Filbert Schilder, NP  bisoprolol (ZEBETA) 5 MG tablet Take 1 tablet (5 mg total) by mouth daily. 09/02/19   Georgie Chard D, NP  clopidogrel (PLAVIX) 75 MG tablet Take 1 tablet (75 mg total) by mouth daily. 09/02/19   Filbert Schilder, NP  furosemide (LASIX) 40 MG tablet Take 1 tablet (40 mg total) by mouth daily. 09/02/19   Georgie Chard D, NP  isosorbide mononitrate (IMDUR) 30 MG 24 hr tablet Take 1 tablet (30 mg total) by mouth daily. 09/11/19 12/10/19  Netta Neat., NP  lisinopril (ZESTRIL) 10 MG tablet Take 1 tablet (10 mg total) by mouth daily. 09/02/19   Georgie Chard D, NP  nitroGLYCERIN (NITROSTAT) 0.4 MG SL tablet DISSOLVE ONE TABLET UNDER TONGUE EVERY 5 MINUTES UP TO 3 DOSES AS NEEDED FOR CHEST PAIN Patient taking differently: Place 0.4 mg under the tongue every 5 (five) minutes as needed.  02/07/18   Antoine Poche, MD  potassium chloride 20 MEQ TBCR Take 20 mEq by mouth daily. 09/02/19   Georgie Chard D, NP  spironolactone (ALDACTONE) 25 MG tablet Take 0.5 tablets (12.5 mg total) by mouth daily. 09/02/19   Filbert Schilder, NP  VENTOLIN HFA 108 [phone] Base) MCG/ACT inhaler INHALE BY MOUTH AS DIRECTED Patient not taking: No sig reported 07/07/17   Antoine Poche, MD    Allergies    Codeine  Review of Systems   Review of Systems  Constitutional: Negative for appetite change and fatigue.  HENT: Negative for congestion, ear discharge and sinus pressure.   Eyes: Negative for discharge.  Respiratory: Negative for cough.   Cardiovascular: Positive for chest pain.  Gastrointestinal: Negative for abdominal pain and diarrhea.  Genitourinary: Negative for frequency and hematuria.  Musculoskeletal: Negative for back pain.  Skin: Negative for rash.  Neurological: Negative for seizures and headaches.  Psychiatric/Behavioral: Negative for hallucinations.    Physical Exam Updated  Vital Signs BP 110/78   Pulse 89   Temp 98.2 F (36.8 C) (Oral)   Resp (!) 30   Ht  (1.803 m)   Wt 95.3 kg   SpO2 (!) 61%   BMI 29.29 kg/m   Physical Exam Vitals and nursing note reviewed.  Constitutional:      Appearance: He is well-developed.  HENT:     Head: Normocephalic.     Nose: Nose normal.  Eyes:     General: No scleral icterus.    Conjunctiva/sclera: Conjunctivae normal.  Neck:     Thyroid: No thyromegaly.  Cardiovascular:     Rate and Rhythm: Normal rate and regular rhythm.     Heart sounds: No murmur heard.  No friction rub. No gallop.   Pulmonary:     Breath sounds: No stridor. No wheezing or rales.  Chest:     Chest wall: No tenderness.  Abdominal:     General: There is no distension.     Tenderness: There is no abdominal tenderness. There is no rebound.  Musculoskeletal:        General: Normal range of motion.     Cervical back: Neck supple.  Lymphadenopathy:     Cervical: No cervical adenopathy.  Skin:    Findings: No erythema or rash.  Neurological:     Mental Status: He is oriented to person, place, and time.     Motor: No abnormal muscle tone.     Coordination: Coordination normal.  Psychiatric:        Behavior: Behavior normal.     ED Results / Procedures / Treatments   Labs (all labs ordered are listed, but only abnormal results are displayed) Labs Reviewed  BASIC METABOLIC PANEL - Abnormal; Notable for the following components:      Result Value   Sodium 131 (*)    Potassium 2.9 (*)    CO2 21 (*)    Glucose, Bld 138 (*)    Calcium 8.2 (*)    All other components within normal limits  CBC - Abnormal; Notable for the following components:   Hemoglobin 12.2 (*)    HCT 38.9 (*)    RDW 17.7 (*)    All other components within normal limits  HEPATIC FUNCTION PANEL - Abnormal; Notable for the following components:   Albumin 3.0 (*)    Bilirubin, Direct 0.6 (*)    All other components within normal limits  TROPONIN I (HIGH  SENSITIVITY) - Abnormal; Notable for the following components:   Troponin I (High  Sensitivity) 23 (*)    All other components within normal limits  TROPONIN I (HIGH SENSITIVITY) - Abnormal; Notable for the following components:   Troponin I (High Sensitivity) 26 (*)    All other components within normal limits  SARS CORONAVIRUS 2 BY RT PCR (HOSPITAL ORDER, PERFORMED IN [facility] LAB)    EKG None  Radiology DG Chest 2 View  Result Date: 10/17/2019 CLINICAL DATA:  Chest pain EXAM: CHEST - 2 VIEW COMPARISON:  08/26/2019 FINDINGS: The heart size and mediastinal contours are within normal limits. Patchy atelectasis or scarring at the right lung base. No pleural effusion or pneumothorax. No acute osseous abnormality. IMPRESSION: Patchy atelectasis or scarring at the right lung base. Electronically Signed   By: Guadlupe Spanish M.D.   On: 10/17/2019 16:51    Procedures Procedures (including critical care time)  Medications Ordered in ED Medications  nitroGLYCERIN (NITROSTAT) SL tablet 0.4 mg (0.4 mg Sublingual Given 10/17/19 1721)  potassium chloride 10 mEq in 100 mL IVPB (10 mEq Intravenous New Bag/Given 10/17/19 2002)  potassium chloride 10 MEQ/100ML IVPB (  Not Given 10/17/19 2003)  pantoprazole (PROTONIX) injection 40 mg (has no administration in time range)  sodium chloride flush (NS) 0.9 % injection 3 mL (3 mLs Intravenous Given 10/17/19 1726)  sodium chloride 0.9 % bolus 500 mL (0 mLs Intravenous Stopped 10/17/19 1917)  potassium chloride 10 mEq in 100 mL IVPB (0 mEq Intravenous Stopped 10/17/19 1941)    ED Course  I have reviewed the triage vital signs and the nursing notes.  Pertinent labs & imaging results that were available during my care of the patient were reviewed by me and considered in my medical decision making (see chart for details). CRITICAL CARE Performed by: Bethann Berkshire Total critical care time: 45 minutes Critical care time was exclusive of separately billable  procedures and treating other patients. Critical care was necessary to treat or prevent imminent or life-threatening deterioration. Critical care was time spent personally by me on the following activities: development of treatment plan with patient and/or surrogate as well as nursing, discussions with consultants, evaluation of patient's response to treatment, examination of patient, obtaining history from patient or surrogate, ordering and performing treatments and interventions, ordering and review of laboratory studies, ordering and review of radiographic studies, pulse oximetry and re-evaluation of patient's condition.    MDM Rules/Calculators/A&P                          Patient with chest pain and coronary artery disease with 2 stents.  I spoke with cardiology and we will admit the patient to the hospitalist with cardiology consult in the morning      This patient presents to the ED for concern of chest pain this involves an extensive number of treatment options, and is a complaint that carries with it a high risk of complications and morbidity.  The differential diagnosis includes MI  Lab Tests:   I Ordered, reviewed, and interpreted labs, which included CBC chemistries troponin patient has hypokalemia anemia and mildly elevated troponin  Medicines ordered:  I ordered medication potassium for hypokalemia and nitro for chest discomfort Imaging Studies ordered:   I ordered imaging studies which included chest x-ray and  I independently visualized and interpreted imaging which showed unremarkable  Additional history obtained:   Additional history obtained from records  Previous records obtained and reviewed.  Consultations Obtained:   I consulted hospitalist and cardiologist and discussed  lab and imaging findings  Reevaluation:  After the interventions stated above, I reevaluated the patient and found minimal improvement  Critical Interventions:  .   Final  Clinical Impression(s) / ED Diagnoses Final diagnoses:  Unstable angina pectoris [facility](HCC)    Rx / DC Orders ED Discharge Orders    None       Bethann BerkshireZammit, Arless, MD 10/17/19 2105

## 2019-10-18 DIAGNOSIS — R066 Hiccough: Secondary | ICD-10-CM | POA: Diagnosis not present

## 2019-10-18 DIAGNOSIS — E876 Hypokalemia: Secondary | ICD-10-CM | POA: Diagnosis present

## 2019-10-18 LAB — COMPREHENSIVE METABOLIC PANEL
ALT: 13 U/L (ref 0–44)
AST: 20 U/L (ref 15–41)
Albumin: 3 g/dL — ABNORMAL LOW (ref 3.5–5.0)
Alkaline Phosphatase: 86 U/L (ref 38–126)
Anion gap: 11 (ref 5–15)
BUN: 11 mg/dL (ref 8–23)
CO2: 20 mmol/L — ABNORMAL LOW (ref 22–32)
Calcium: 8.1 mg/dL — ABNORMAL LOW (ref 8.9–10.3)
Chloride: 102 mmol/L (ref 98–111)
Creatinine, Ser: 1.05 mg/dL (ref 0.61–1.24)
GFR calc Af Amer: >60 mL/min (ref >60)
GFR calc non Af Amer: >60 mL/min (ref >60)
Glucose, Bld: 135 mg/dL — ABNORMAL HIGH (ref 70–99)
Potassium: 4 mmol/L (ref 3.5–5.1)
Sodium: 133 mmol/L — ABNORMAL LOW (ref 135–145)
Total Bilirubin: 1.5 mg/dL — ABNORMAL HIGH (ref 0.3–1.2)
Total Protein: 7.2 g/dL (ref 6.5–8.1)

## 2019-10-18 LAB — CBC WITH DIFFERENTIAL/PLATELET
Abs Immature Granulocytes: 0.05 10*3/uL (ref 0.00–0.07)
Basophils Absolute: 0.1 10*3/uL (ref 0.0–0.1)
Basophils Relative: 1 %
Eosinophils Absolute: 0.1 10*3/uL (ref 0.0–0.5)
Eosinophils Relative: 1 %
HCT: 38.9 % — ABNORMAL LOW (ref 39.0–52.0)
Hemoglobin: 12.1 g/dL — ABNORMAL LOW (ref 13.0–17.0)
Immature Granulocytes: 1 %
Lymphocytes Relative: 19 %
Lymphs Abs: 1.5 10*3/uL (ref 0.7–4.0)
MCH: 28.9 pg (ref 26.0–34.0)
MCHC: 31.1 g/dL (ref 30.0–36.0)
MCV: 92.8 fL (ref 80.0–100.0)
Monocytes Absolute: 0.9 10*3/uL (ref 0.1–1.0)
Monocytes Relative: 12 %
Neutro Abs: 5.1 10*3/uL (ref 1.7–7.7)
Neutrophils Relative %: 66 %
Platelets: 214 10*3/uL (ref 150–400)
RBC: 4.19 MIL/uL — ABNORMAL LOW (ref 4.22–5.81)
RDW: 18.1 % — ABNORMAL HIGH (ref 11.5–15.5)
WBC: 7.7 10*3/uL (ref 4.0–10.5)
nRBC: 0 % (ref 0.0–0.2)

## 2019-10-18 LAB — TROPONIN I (HIGH SENSITIVITY): Troponin I (High Sensitivity): 26 ng/L — ABNORMAL HIGH (ref <18)

## 2019-10-18 LAB — TSH: TSH: 0.84 u[IU]/mL (ref 0.350–4.500)

## 2019-10-18 LAB — MAGNESIUM: Magnesium: 1.7 mg/dL (ref 1.7–2.4)

## 2019-10-18 MED ORDER — ASPIRIN 81 MG PO CHEW
81.0000 mg | CHEWABLE_TABLET | Freq: Every day | ORAL | Status: DC
  Filled 2019-10-18: qty 1

## 2019-10-18 MED ORDER — DIAZEPAM 5 MG PO TABS
5.0000 mg | ORAL_TABLET | Freq: Two times a day (BID) | ORAL | Status: DC | PRN
  Administered 2019-10-18: 5 mg via ORAL
  Filled 2019-10-18: qty 1

## 2019-10-18 MED ORDER — POTASSIUM CHLORIDE 20 MEQ PO PACK
40.0000 meq | PACK | Freq: Once | ORAL | Status: AC
  Administered 2019-10-18: 40 meq via ORAL
  Filled 2019-10-18: qty 2

## 2019-10-18 MED ORDER — POLYETHYLENE GLYCOL 3350 17 G PO PACK: 17.0000 g | PACK | Freq: Every day | ORAL | Status: DC | PRN

## 2019-10-18 MED ORDER — ATORVASTATIN CALCIUM 40 MG PO TABS
40.0000 mg | ORAL_TABLET | Freq: Every day | ORAL | Status: DC
  Administered 2019-10-18: 40 mg via ORAL
  Filled 2019-10-18: qty 1

## 2019-10-18 MED ORDER — POTASSIUM CHLORIDE ER 20 MEQ PO TBCR: 20.0000 meq | EXTENDED_RELEASE_TABLET | Freq: Every day | ORAL | 2 refills | Status: AC

## 2019-10-18 MED ORDER — ENSURE ENLIVE PO LIQD: 237.0000 mL | Freq: Two times a day (BID) | ORAL | 12 refills | Status: AC

## 2019-10-18 MED ORDER — CLOPIDOGREL BISULFATE 75 MG PO TABS
75.0000 mg | ORAL_TABLET | Freq: Every day | ORAL | Status: DC
  Administered 2019-10-18: 75 mg via ORAL
  Filled 2019-10-18: qty 1

## 2019-10-18 MED ORDER — DIAZEPAM 5 MG PO TABS: 5.0000 mg | ORAL_TABLET | Freq: Two times a day (BID) | ORAL | 0 refills | Status: AC | PRN

## 2019-10-18 MED ORDER — TRAZODONE HCL 50 MG PO TABS
50.0000 mg | ORAL_TABLET | Freq: Every evening | ORAL | Status: DC | PRN
  Administered 2019-10-18: 50 mg via ORAL
  Filled 2019-10-18: qty 1

## 2019-10-18 MED ORDER — HYDROCODONE-ACETAMINOPHEN 7.5-325 MG/15ML PO SOLN
10.0000 mL | Freq: Four times a day (QID) | ORAL | Status: DC | PRN
  Administered 2019-10-18 (×2): 10 mL via ORAL
  Filled 2019-10-18 (×2): qty 15

## 2019-10-18 MED ORDER — BISOPROLOL FUMARATE 5 MG PO TABS
5.0000 mg | ORAL_TABLET | Freq: Every day | ORAL | Status: DC
  Administered 2019-10-18: 5 mg via ORAL
  Filled 2019-10-18: qty 1

## 2019-10-18 MED ORDER — POTASSIUM CHLORIDE 10 MEQ/100ML IV SOLN
10.0000 meq | INTRAVENOUS | Status: AC
  Administered 2019-10-18 (×3): 10 meq via INTRAVENOUS
  Filled 2019-10-18 (×3): qty 100

## 2019-10-18 MED ORDER — ENSURE ENLIVE PO LIQD: 237.0000 mL | Freq: Two times a day (BID) | ORAL | Status: DC

## 2019-10-18 MED ORDER — POTASSIUM CHLORIDE CRYS ER 20 MEQ PO TBCR
20.0000 meq | EXTENDED_RELEASE_TABLET | Freq: Every day | ORAL | Status: DC
  Administered 2019-10-18: 20 meq via ORAL
  Filled 2019-10-18: qty 1

## 2019-10-18 MED ORDER — SPIRONOLACTONE 25 MG PO TABS
12.5000 mg | ORAL_TABLET | Freq: Every day | ORAL | Status: DC
  Administered 2019-10-18: 12.5 mg via ORAL
  Filled 2019-10-18 (×4): qty 0.5
  Filled 2019-10-18: qty 1

## 2019-10-18 MED ORDER — ACETAMINOPHEN 650 MG RE SUPP: 650.0000 mg | Freq: Four times a day (QID) | RECTAL | Status: DC | PRN

## 2019-10-18 MED ORDER — APIXABAN 5 MG PO TABS
5.0000 mg | ORAL_TABLET | Freq: Two times a day (BID) | ORAL | Status: DC
  Administered 2019-10-18 (×2): 5 mg via ORAL
  Filled 2019-10-18 (×2): qty 1

## 2019-10-18 MED ORDER — ACETAMINOPHEN 325 MG PO TABS
650.0000 mg | ORAL_TABLET | Freq: Four times a day (QID) | ORAL | Status: DC | PRN
  Administered 2019-10-18: 650 mg via ORAL
  Filled 2019-10-18 (×2): qty 2

## 2019-10-18 MED ORDER — SILVER SULFADIAZINE 1 % EX CREA: TOPICAL_CREAM | Freq: Two times a day (BID) | CUTANEOUS | 0 refills | Status: AC

## 2019-10-18 MED ORDER — ALBUTEROL SULFATE (2.5 MG/3ML) 0.083% IN NEBU: 2.5000 mg | INHALATION_SOLUTION | RESPIRATORY_TRACT | Status: DC | PRN

## 2019-10-18 MED ORDER — ONDANSETRON HCL 4 MG/2ML IJ SOLN: 4.0000 mg | Freq: Four times a day (QID) | INTRAMUSCULAR | Status: DC | PRN

## 2019-10-18 MED ORDER — ONDANSETRON HCL 4 MG PO TABS: 4.0000 mg | ORAL_TABLET | Freq: Four times a day (QID) | ORAL | Status: DC | PRN

## 2019-10-18 MED ORDER — PANTOPRAZOLE SODIUM 40 MG IV SOLR: 40.0000 mg | INTRAVENOUS | Status: DC

## 2019-10-18 MED ORDER — LISINOPRIL 10 MG PO TABS: 10.0000 mg | ORAL_TABLET | Freq: Every day | ORAL | Status: DC

## 2019-10-18 MED ORDER — ISOSORBIDE MONONITRATE ER 30 MG PO TB24
30.0000 mg | ORAL_TABLET | Freq: Every day | ORAL | Status: DC
  Administered 2019-10-18: 30 mg via ORAL
  Filled 2019-10-18: qty 1

## 2019-10-18 MED ORDER — SILVER SULFADIAZINE 1 % EX CREA
TOPICAL_CREAM | Freq: Two times a day (BID) | CUTANEOUS | Status: DC
  Filled 2019-10-18: qty 85

## 2019-10-18 NOTE — TOC Transition Note (Signed)
Transition of Care [facility]) - CM/SW Discharge Note   Patient Details  Name: Eugene Parker MRN: [id number] Date of Birth: 03-29-1961  [facility] CM/SW Contact:  Leitha Bleak, RN Phone Number: 10/18/2019, 2:25 PM   Clinical Narrative:   Patient admitted with Hypokalemia. Patient is discharging home today. Patient lives in a camper, He is content and fine there, does not want to live in an apartment. He does not have a PCP, TOC called [facility] and received New Patient form to give to patient to fill out and drop off at the office. Dr Sherryll Burger will sign home health orders until patient can get new patient appointment.  [place] with [facility] accepted the referral for [facility].       Barriers to Discharge: Barriers Resolved   Patient Goals and CMS Choice Patient states their goals for this hospitalization and ongoing recovery are:: to go home. CMS Medicare.gov Compare Post Acute Care list provided to:: Patient Choice offered to / list presented to : Patient  Discharge Placement           Discharge Plan and Services     Post Acute Care Choice: Home Health                 HH Arranged: RN [facility] Agency: [facility] Date [facility] Agency Contacted: 10/18/19 Time HH Agency Contacted: [phone] Representative spoke with at [facility] Agency: [facility]

## 2019-10-18 NOTE — ED Notes (Signed)
Wound to right foot. Right foot bandaged.
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
## 2019-10-18 NOTE — Consult Note (Signed)
WOC Nurse Consult Note: Patient receiving care in AP 312.  Consult completed remotely after review of record, including images of foot wounds. Reason for Consult: right foot wound management recommendations Wound type: chronic, non-healing Pressure Injury POA: Yes/No/NA Measurement: see images and flowsheet section Wound bed: Drainage (amount, consistency, odor)  Periwound: Dressing procedure/placement/frequency:  Wash foot wounds with soap and water. Pat dry. Apply Silvadene, cover with telfa pads, secure with kerlex. Before twice daily. Monitor the wound area(s) for worsening of condition such as: Signs/symptoms of infection,  Increase in size,  Development of or worsening of odor, Development of pain, or increased pain at the affected locations.  Notify the medical team if any of these develop.  Thank you for the consult. WOC nurse will not follow at this time.  Please re-consult the WOC team if needed.  Helmut Muster, RN, MSN, CWOCN, CNS-BC, pager [phone]

## 2019-10-18 NOTE — Progress Notes (Signed)
Dressing changed to right foot wounds. IV removed and DC instructions reviewed. Scripts sent to [facility].

## 2019-10-18 NOTE — Care Management Obs Status (Signed)
MEDICARE OBSERVATION STATUS NOTIFICATION   Patient Details  Name: Eugene Parker MRN: [id number] Date of Birth: 07-24-1960   Medicare Observation Status Notification Given:  Yes    Leitha Bleak, RN 10/18/2019, 1:25 PM

## 2019-10-18 NOTE — Care Management Obs Status (Signed)
MEDICARE OBSERVATION STATUS NOTIFICATION   Patient Details  Name: Eugene Parker MRN: [id number] Date of Birth: [birth year]   Medicare Observation Status Notification Given:  Yes    Leitha Bleak, RN 10/18/2019, 1:24 PM

## 2019-10-18 NOTE — Care Management CC44 (Signed)
Condition Code 44 Documentation Completed  Patient Details  Name: Eugene Parker MRN: [id number] Date of Birth: 15-Aug-1960   Condition Code 44 given:  Yes Patient signature on Condition Code 44 notice:  Yes Documentation of 2 MD's agreement:  Yes Code 44 added to claim:  Yes    Leitha Bleak, RN 10/18/2019, 1:24 PM

## 2019-10-23 ENCOUNTER — Ambulatory Visit: Payer: Medicare Other | Admitting: Cardiology

## 2019-10-24 ENCOUNTER — Other Ambulatory Visit: Payer: Self-pay

## 2019-10-24 ENCOUNTER — Ambulatory Visit (INDEPENDENT_AMBULATORY_CARE_PROVIDER_SITE_OTHER): Payer: Medicare Other | Admitting: Cardiology

## 2019-10-24 ENCOUNTER — Encounter: Payer: Self-pay | Admitting: Cardiology

## 2019-10-24 VITALS — BP 120/78 | HR 68 | Ht 71.0 in | Wt 209.0 lb

## 2019-10-24 DIAGNOSIS — I2510 Atherosclerotic heart disease of native coronary artery without angina pectoris: Secondary | ICD-10-CM | POA: Diagnosis not present

## 2019-10-24 DIAGNOSIS — I200 Unstable angina: Secondary | ICD-10-CM | POA: Diagnosis not present

## 2019-10-24 DIAGNOSIS — I4891 Unspecified atrial fibrillation: Secondary | ICD-10-CM

## 2019-10-24 DIAGNOSIS — I5022 Chronic systolic (congestive) heart failure: Secondary | ICD-10-CM

## 2019-10-24 NOTE — Patient Instructions (Signed)
Your physician recommends that you schedule a follow-up appointment in: 3 WEEKS WITH DR BRANCH OR EXTENDER  Your physician recommends that you continue on your current medications as directed. Please refer to the Current Medication list given to you today.  PLEASE UPDATE [place] ON YOUR LISINOPRIL   Your physician recommends that you return for lab work BMP/MG  Thank you for choosing [facility]!!

## 2019-10-24 NOTE — Progress Notes (Signed)
Clinical Summary Mr. Perlmutter is a 59 y.o.male seen today for follow up of the following medical problems.   1. CAD - hx of inferior STEMI 06/2014, received DES to LCX.  - echo 11/2014 LVEF 55-60%  - denies any chest pain. Stable SOB.Exertion primarily limited due to leg pain, sedentary lifestyle  -08/2019 cath: mid LAD 75%, D1 100% CTO, LCX 25% mid, RCA occluded. Received DES to LAD, PTCA of D1. Mean PA 31, PCWP 24, CI 2.25, LVEDP 20  - reports some recent chest pains, but improved with diazepam for muscle spasms - compliant with meds  2. Chronic systolic HF - 04/6107 echo LVEF 20-25%, grade III DDx, severe RV dysfunction.  -08/2019 cath: mid LAD 75%, D1 100% CTO, LCX 25% mid, RCA occluded. Received DES to LAD, PTCA of D1. Mean PA 31, PCWP 24, CI 2.25, LVEDP 20  - some LE edema at times.  - home weights stable around 210 lbs   3. Hypokalemia - admitted 10/2019 with hypokalemia, K 2.9    4. COPD - abnormal PFTs 03/2015  5. PAD - followed by vascular - history of right footwound, had prior right SFA stenting that became occluded. - s/p right femoropopliteal bypass.    6. Afib - no recent palpitations.  - no bleeding on eliquis     SH: wife has passed away Has not been vaccinated covid  Past Medical History:  Diagnosis Date  . Anxiety   . CAD in native artery    a. STEMI 06/2014 s/p DES to Cx.  . Cellulitis and abscess of foot 03/22/2016  . Collagen vascular disease ([place])   . Current smoker   . Dental caries    periodontitis  . DJD (degenerative joint disease), lumbosacral   . Essential hypertension   . Heart attack ([place])   . Hiccups   . Hyperlipemia   . PAD (peripheral artery disease) ([place])   . Stroke (cerebrum) ([facility])      Allergies  Allergen Reactions  . Codeine Nausea Only     Current Outpatient Medications  Medication Sig Dispense Refill  . apixaban (ELIQUIS) 5 MG TABS tablet Take 1 tablet (5 mg total) by mouth 2 (two)  times daily. 120 tablet 2  . aspirin 81 MG chewable tablet Chew 1 tablet (81 mg total) by mouth daily. (Patient not taking: Reported on 10/17/2019)    . atorvastatin (LIPITOR) 40 MG tablet Take 1 tablet (40 mg total) by mouth daily at 6 PM. 60 tablet 2  . bisoprolol (ZEBETA) 5 MG tablet Take 1 tablet (5 mg total) by mouth daily. 60 tablet 2  . clopidogrel (PLAVIX) 75 MG tablet Take 1 tablet (75 mg total) by mouth daily. 60 tablet 2  . diazepam (VALIUM) 5 MG tablet Take 1 tablet (5 mg total) by mouth every 12 (twelve) hours as needed for muscle spasms (hiccups). 10 tablet 0  . feeding supplement, ENSURE ENLIVE, (ENSURE ENLIVE) LIQD Take 237 mLs by mouth 2 (two) times daily between meals. 237 mL 12  . furosemide (LASIX) 40 MG tablet Take 1 tablet (40 mg total) by mouth daily. 60 tablet 2  . isosorbide mononitrate (IMDUR) 30 MG 24 hr tablet Take 1 tablet (30 mg total) by mouth daily. 90 tablet 3  . nitroGLYCERIN (NITROSTAT) 0.4 MG SL tablet DISSOLVE ONE TABLET UNDER TONGUE EVERY 5 MINUTES UP TO 3 DOSES AS NEEDED FOR CHEST PAIN (Patient taking differently: Place 0.4 mg under the tongue every 5 (five) minutes as  needed. ) 25 tablet 2  . Potassium Chloride ER 20 MEQ TBCR Take 20 mEq by mouth daily. 60 tablet 2  . silver sulfADIAZINE (SILVADENE) 1 % cream Apply topically 2 (two) times daily. 50 g 0  . spironolactone (ALDACTONE) 25 MG tablet Take 0.5 tablets (12.5 mg total) by mouth daily. 60 tablet 2  . VENTOLIN HFA 108 (90 Base) MCG/ACT inhaler INHALE BY MOUTH AS DIRECTED (Patient not taking: No sig reported) 18 g 1   No current facility-administered medications for this visit.     Past Surgical History:  Procedure Laterality Date  . ABDOMINAL AORTOGRAM W/LOWER EXTREMITY N/A 12/21/2016   Procedure: ABDOMINAL AORTOGRAM W/LOWER EXTREMITY;  Surgeon: Waynetta Sandy, MD;  Location: [place] [facility];  Service: Cardiovascular;  Laterality: N/A;  . ABDOMINAL AORTOGRAM W/LOWER EXTREMITY N/A  11/01/2017   Procedure: ABDOMINAL AORTOGRAM W/LOWER EXTREMITY;  Surgeon: Waynetta Sandy, MD;  Location: [place] [facility];  Service: Cardiovascular;  Laterality: N/A;  . CORONARY STENT INTERVENTION N/A 08/30/2019   Procedure: CORONARY STENT INTERVENTION;  Surgeon: Jettie Booze, MD;  Location: [place] [facility];  Service: Cardiovascular;  Laterality: N/A;  . ENDARTERECTOMY FEMORAL Right 11/02/2017   Procedure: ENDARTERECTOMY RIGHT FEMORAL ARTERY;  Surgeon: Waynetta Sandy, MD;  Location: [place];  Service: Vascular;  Laterality: Right;  . FEMORAL-POPLITEAL BYPASS GRAFT Right 11/02/2017   Procedure: BYPASS GRAFT RIGHT FEMORAL TO BELOW KNEE POPLITEAL ARTERY USING RIGHT GREAT SAPHENOUS VEIN ;  Surgeon: Waynetta Sandy, MD;  Location: [place];  Service: Vascular;  Laterality: Right;  . INTRADISCAL INJECTION  Oct 2012   L5-S1  . INTRAVASCULAR ULTRASOUND/IVUS N/A 08/30/2019   Procedure: Intravascular Ultrasound/IVUS;  Surgeon: Jettie Booze, MD;  Location: [place] [facility];  Service: Cardiovascular;  Laterality: N/A;  . LEFT HEART CATHETERIZATION WITH CORONARY ANGIOGRAM N/A 07/04/2014   Procedure: LEFT HEART CATHETERIZATION WITH CORONARY ANGIOGRAM;  Surgeon: Lorretta Harp, MD;  Location: [facility];  Service: Cardiovascular;  Laterality: N/A;  . LEG SURGERY    . PERIPHERAL VASCULAR CATHETERIZATION N/A 02/03/2016   Procedure: Abdominal Aortogram;  Surgeon: Waynetta Sandy, MD;  Location: [place] [facility];  Service: Cardiovascular;  Laterality: N/A;  . PERIPHERAL VASCULAR CATHETERIZATION N/A 02/03/2016   Procedure: Lower Extremity Angiography;  Surgeon: Waynetta Sandy, MD;  Location: [place] [facility];  Service: Cardiovascular;  Laterality: N/A;  . PERIPHERAL VASCULAR CATHETERIZATION Right 02/03/2016   Procedure: Peripheral Vascular Intervention;  Surgeon: Waynetta Sandy, MD;  Location: [place] [facility];  Service:  Cardiovascular;  Laterality: Right;  SFA  . RIGHT/LEFT HEART CATH AND CORONARY ANGIOGRAPHY N/A 08/30/2019   Procedure: RIGHT/LEFT HEART CATH AND CORONARY ANGIOGRAPHY;  Surgeon: Jettie Booze, MD;  Location: [place] [facility];  Service: Cardiovascular;  Laterality: N/A;  . TOOTH EXTRACTION N/A 02/02/2018   Procedure: DENTAL RESTORATION/EXTRACTIONS;  Surgeon: Diona Browner, DDS;  Location: [place];  Service: Oral Surgery;  Laterality: N/A;  . VEIN HARVEST Right 11/02/2017   Procedure: VEIN HARVEST RIGHT GREAT SAPHENOUS VEIN;  Surgeon: Waynetta Sandy, MD;  Location: [place];  Service: Vascular;  Laterality: Right;     Allergies  Allergen Reactions  . Codeine Nausea Only      Family History  Problem Relation Age of Onset  . Heart disease Mother   . COPD Mother   . Heart disease Father      Social History Mr. Quinney reports that he has been smoking cigarettes. He started  smoking about 49 years ago. He has a 20.00 pack-year smoking history. He has never used smokeless tobacco. Mr. Malinak reports current alcohol use.   Review of Systems CONSTITUTIONAL: No weight loss, fever, chills, weakness or fatigue.  HEENT: Eyes: No visual loss, blurred vision, double vision or yellow sclerae.No hearing loss, sneezing, congestion, runny nose or sore throat.  SKIN: No rash or itching.  CARDIOVASCULAR: per hpi RESPIRATORY: No shortness of breath, cough or sputum.  GASTROINTESTINAL: No anorexia, nausea, vomiting or diarrhea. No abdominal pain or blood.  GENITOURINARY: No burning on urination, no polyuria NEUROLOGICAL: No headache, dizziness, syncope, paralysis, ataxia, numbness or tingling in the extremities. No change in bowel or bladder control.  MUSCULOSKELETAL: No muscle, back pain, joint pain or stiffness.  LYMPHATICS: No enlarged nodes. No history of splenectomy.  PSYCHIATRIC: No history of depression or anxiety.  ENDOCRINOLOGIC: No reports of sweating, cold or heat intolerance.  No polyuria or polydipsia.  Marland Kitchen   Physical Examination Today's Vitals   10/24/19 0825  BP: 120/78  Pulse: 68  SpO2: 98%  Weight: 209 lb (94.8 kg)  Height: '5\' 11"'$  (1.803 m)   Body mass index is 29.15 kg/m.  Gen: resting comfortably, no acute distress HEENT: no scleral icterus, pupils equal round and reactive, no palptable cervical adenopathy,  CV: RRR, no m/r/g, no jvd Resp: Clear to auscultation bilaterally GI: abdomen is soft, non-tender, non-distended, normal bowel sounds, no hepatosplenomegaly MSK: extremities are warm, 1+ bilateral LE edema Skin: warm, no rash Neuro:  no focal deficits Psych: appropriate affect   Diagnostic Studies 06/2014 Cath HEMODYNAMICS:   AO SYSTOLIC/AO DIASTOLIC: 604/54  LV SYSTOLIC/LV DIASTOLIC: 098/11  ANGIOGRAPHIC RESULTS:   1. Left main; normal  2. LAD; minor irregularities 3. Left circumflex; codominant with occluded mid AV groove after OM 3.  4. Right coronary artery; codominant with 40-50% segmental mid 7. Left ventriculography; RAO left ventriculogram was performed using  25 mL of Visipaque dye at 12 mL/second. The overall LVEF estimated  45-50 % With wall motion abnormalities moderate inferoapical and posterolateral hypokinesia  IMPRESSION:Mr. Hulbert has been occluded codominant circumflex with otherwise noncritical CAD. We will proceed with direct intervention using Angiomax, Brilenta, and drug eluting stent.  Procedure description: The patient received Angiomax bolus and infusion with an ACT of 435. A total of 185 mL of contrast was administered to the patient. Using a 6 [place] XB 3.5 similar guide catheter along with a 1/190 cm long pro-water guidewire and a 2 mm x 12 mm Euphora balloon the lesion was crossed and angioplasty performed. The door to balloon time was 26 minutes. Following this A 2.75 mm x 23 mm long Xience drug-eluting stent was then carefully positioned and deployed at 12 atm and postdilated with a 3 mm x  20 mm long [place] Euphora balloon at 15 atm (3.1 mm) resulting in reduction with total occlusion to 0% residual with TIMI-3 flow and no dissection. The patient tolerated the procedure well. There are no hemodynamic or electrocardiographic sequela. The guidewire and catheter were removed and the sheath was secured.   Final impression: successful PCI and stenting of a occluded mid codominant circumflex in the setting of an acute inferolateral myocardial infarction with a door to balloon time 26 minutes using Angiomax, Proventil and drug-eluting stent. The patient had noncritical disease otherwise with preserved LV function. He'll be treated with usual medications including beta blocker, statin drug and ACE inhibitor. His sheath will be removed in several hours and pressure held. He will be  fast tracked with anticipated discharge in 48-72 hours. He left the lab in stable condition.   11/2014 echo Study Conclusions  - Left ventricle: The cavity size was normal. Wall thickness was increased in a pattern of mild LVH. Systolic function was normal. The estimated ejection fraction was in the range of 55% to 60%. Images were inadequate for LV wall motion assessment. However, no gross regional variation on available images. Diastolic dysfunction, grade indeterminate. - Aortic valve: Mildly to moderately calcified annulus. Mildly calcified leaflets. - Mitral valve: There was trivial regurgitation.  03/2015 PFTs: technically difficult due to poor patient cooperation. Was some evidence of obstruction.   09/2017 nuclear stress  Blood pressure demonstrated a normal response to exercise.  There was no ST segment deviation noted during stress.  Findings consistent with prior myocardial infarction with peri-infarct ischemia.  This is an intermediate risk study.  The left ventricular ejection fraction is moderately decreased (30-44%).  08/2019 echo IMPRESSIONS    1. Left ventricular  ejection fraction, by estimation, is 20 to 25%. The  left ventricle has severely decreased function. The left ventricle  demonstrates global hypokinesis. There is mild left ventricular  hypertrophy of the basal-septal segment. Left  ventricular diastolic parameters are consistent with Grade III diastolic  dysfunction (restrictive). Elevated left ventricular end-diastolic  pressure.  2. Right ventricular systolic function is severely reduced. The right  ventricular size is mildly enlarged.  3. Left atrial size was mild to moderately dilated.  4. Right atrial size was mildly dilated.  5. The mitral valve is grossly normal. Mild mitral valve regurgitation.  6. The aortic valve is tricuspid. Aortic valve regurgitation is not  visualized.  7. The inferior vena cava is dilated in size with <50% respiratory  variability, suggesting right atrial pressure of 15 mmHg.      Assessment and Plan  1. CAD -no symptoms, continue current meds.   2. Chronic systolic HF - weights stable, chronic mild bilateral LE edema - from 10/18/19 discharge summary lisinopril was stopped, does not indicate why. He is not sure if he is still taking or not. Would plan to d/c lisinopril and start entresto once he verfies if still taking or not, he will call us later today   3. Afib - no symptoms, continue eliquis and bisoprolol  4. Hypokalemia - repeat labs   F/u 3 weeks, further titration of CHF meds at that time.      Arnoldo Lenis, M.D.

## 2019-11-14 ENCOUNTER — Ambulatory Visit: Payer: Medicare Other | Admitting: Family Medicine

## 2019-12-24 ENCOUNTER — Ambulatory Visit: Payer: Medicare Other | Admitting: Cardiology

## 2019-12-24 NOTE — Progress Notes (Deleted)
Clinical Summary Mr. Patrone is a 59 y.o.male seen today for follow up of the following medical problems.   1. CAD - hx of inferior STEMI 06/2014, received DES to LCX.  - echo 11/2014 LVEF 55-60%  - denies any chest pain. Stable SOB.Exertion primarily limited due to leg pain, sedentary lifestyle  -08/2019 cath: mid LAD 75%, D1 100% CTO, LCX 25% mid, RCA occluded. Received DES to LAD, PTCA of D1. Mean PA 31, PCWP 24, CI 2.25, LVEDP 20  - reports some recent chest pains, but improved with diazepam for muscle spasms - compliant with meds  2. Chronic systolic HF - 04/5174 echo LVEF 20-25%, grade III DDx, severe RV dysfunction.  -08/2019 cath: mid LAD 75%, D1 100% CTO, LCX 25% mid, RCA occluded. Received DES to LAD, PTCA of D1. Mean PA 31, PCWP 24, CI 2.25, LVEDP 20  - some LE edema at times.  - home weights stable around 210 lbs   3. Hypokalemia - admitted 10/2019 with hypokalemia, K 2.9    4. COPD - abnormal PFTs 03/2015  5. PAD - followed by vascular - history of right footwound, had prior right SFA stenting that became occluded. - s/pright femoropopliteal bypass.    6. Afib - no recent palpitations.  - no bleeding on eliquis     HY:WVPX has passed away Has not been vaccinated covid   Past Medical History:  Diagnosis Date  . Anxiety   . CAD in native artery    a. STEMI 06/2014 s/p DES to Cx.  . Cellulitis and abscess of foot 03/22/2016  . Collagen vascular disease ([place])   . Current smoker   . Dental caries    periodontitis  . DJD (degenerative joint disease), lumbosacral   . Essential hypertension   . Heart attack ([place])   . Hiccups   . Hyperlipemia   . PAD (peripheral artery disease) ([place])   . Stroke (cerebrum) ([facility])      Allergies  Allergen Reactions  . Codeine Nausea Only     Current Outpatient Medications  Medication Sig Dispense Refill  . apixaban (ELIQUIS) 5 MG TABS tablet Take 1 tablet (5 mg total) by mouth 2  (two) times daily. 120 tablet 2  . atorvastatin (LIPITOR) 40 MG tablet Take 1 tablet (40 mg total) by mouth daily at 6 PM. 60 tablet 2  . bisoprolol (ZEBETA) 5 MG tablet Take 1 tablet (5 mg total) by mouth daily. 60 tablet 2  . clopidogrel (PLAVIX) 75 MG tablet Take 1 tablet (75 mg total) by mouth daily. 60 tablet 2  . diazepam (VALIUM) 5 MG tablet Take 1 tablet (5 mg total) by mouth every 12 (twelve) hours as needed for muscle spasms (hiccups). 10 tablet 0  . feeding supplement, ENSURE ENLIVE, (ENSURE ENLIVE) LIQD Take 237 mLs by mouth 2 (two) times daily between meals. 237 mL 12  . furosemide (LASIX) 40 MG tablet Take 1 tablet (40 mg total) by mouth daily. 60 tablet 2  . isosorbide mononitrate (IMDUR) 30 MG 24 hr tablet Take 1 tablet (30 mg total) by mouth daily. 90 tablet 3  . nitroGLYCERIN (NITROSTAT) 0.4 MG SL tablet DISSOLVE ONE TABLET UNDER TONGUE EVERY 5 MINUTES UP TO 3 DOSES AS NEEDED FOR CHEST PAIN (Patient taking differently: Place 0.4 mg under the tongue every 5 (five) minutes as needed. ) 25 tablet 2  . Potassium Chloride ER 20 MEQ TBCR Take 20 mEq by mouth daily. 60 tablet 2  . silver  sulfADIAZINE (SILVADENE) 1 % cream Apply topically 2 (two) times daily. 50 g 0  . spironolactone (ALDACTONE) 25 MG tablet Take 0.5 tablets (12.5 mg total) by mouth daily. 60 tablet 2   No current facility-administered medications for this visit.     Past Surgical History:  Procedure Laterality Date  . ABDOMINAL AORTOGRAM W/LOWER EXTREMITY N/A 12/21/2016   Procedure: ABDOMINAL AORTOGRAM W/LOWER EXTREMITY;  Surgeon: Waynetta Sandy, MD;  Location: [facility];  Service: Cardiovascular;  Laterality: N/A;  . ABDOMINAL AORTOGRAM W/LOWER EXTREMITY N/A 11/01/2017   Procedure: ABDOMINAL AORTOGRAM W/LOWER EXTREMITY;  Surgeon: Waynetta Sandy, MD;  Location: [facility];  Service: Cardiovascular;  Laterality: N/A;  . CORONARY STENT INTERVENTION N/A 08/30/2019   Procedure:  CORONARY STENT INTERVENTION;  Surgeon: Jettie Booze, MD;  Location: [facility];  Service: Cardiovascular;  Laterality: N/A;  . ENDARTERECTOMY FEMORAL Right 11/02/2017   Procedure: ENDARTERECTOMY RIGHT FEMORAL ARTERY;  Surgeon: Waynetta Sandy, MD;  Location: [facility];  Service: Vascular;  Laterality: Right;  . FEMORAL-POPLITEAL BYPASS GRAFT Right 11/02/2017   Procedure: BYPASS GRAFT RIGHT FEMORAL TO BELOW KNEE POPLITEAL ARTERY USING RIGHT GREAT SAPHENOUS VEIN ;  Surgeon: Waynetta Sandy, MD;  Location: [facility];  Service: Vascular;  Laterality: Right;  . INTRADISCAL INJECTION  Oct 2012   L5-S1  . INTRAVASCULAR ULTRASOUND/IVUS N/A 08/30/2019   Procedure: Intravascular Ultrasound/IVUS;  Surgeon: Jettie Booze, MD;  Location: [facility];  Service: Cardiovascular;  Laterality: N/A;  . LEFT HEART CATHETERIZATION WITH CORONARY ANGIOGRAM N/A 07/04/2014   Procedure: LEFT HEART CATHETERIZATION WITH CORONARY ANGIOGRAM;  Surgeon: Lorretta Harp, MD;  Location: [facility];  Service: Cardiovascular;  Laterality: N/A;  . LEG SURGERY    . PERIPHERAL VASCULAR CATHETERIZATION N/A 02/03/2016   Procedure: Abdominal Aortogram;  Surgeon: Waynetta Sandy, MD;  Location: [facility];  Service: Cardiovascular;  Laterality: N/A;  . PERIPHERAL VASCULAR CATHETERIZATION N/A 02/03/2016   Procedure: Lower Extremity Angiography;  Surgeon: Waynetta Sandy, MD;  Location: [facility];  Service: Cardiovascular;  Laterality: N/A;  . PERIPHERAL VASCULAR CATHETERIZATION Right 02/03/2016   Procedure: Peripheral Vascular Intervention;  Surgeon: Waynetta Sandy, MD;  Location: [facility];  Service: Cardiovascular;  Laterality: Right;  SFA  . RIGHT/LEFT HEART CATH AND CORONARY ANGIOGRAPHY N/A 08/30/2019   Procedure: RIGHT/LEFT HEART CATH AND CORONARY ANGIOGRAPHY;  Surgeon: Jettie Booze, MD;  Location: [facility];  Service:  Cardiovascular;  Laterality: N/A;  . TOOTH EXTRACTION N/A 02/02/2018   Procedure: DENTAL RESTORATION/EXTRACTIONS;  Surgeon: Diona Browner, DDS;  Location: [facility];  Service: Oral Surgery;  Laterality: N/A;  . VEIN HARVEST Right 11/02/2017   Procedure: VEIN HARVEST RIGHT GREAT SAPHENOUS VEIN;  Surgeon: Waynetta Sandy, MD;  Location: [facility];  Service: Vascular;  Laterality: Right;     Allergies  Allergen Reactions  . Codeine Nausea Only      Family History  Problem Relation Age of Onset  . Heart disease Mother   . COPD Mother   . Heart disease Father      Social History Mr. Bady reports that he has been smoking cigarettes. He started smoking about 49 years ago. He has a 20.00 pack-year smoking history. He has never used smokeless tobacco. Mr. Linse reports current alcohol use.   Review of Systems CONSTITUTIONAL: No weight loss, fever, chills, weakness or fatigue.  HEENT: Eyes: No visual loss, blurred vision, double vision  or yellow sclerae.No hearing loss, sneezing, congestion, runny nose or sore throat.  SKIN: No rash or itching.  CARDIOVASCULAR:  RESPIRATORY: No shortness of breath, cough or sputum.  GASTROINTESTINAL: No anorexia, nausea, vomiting or diarrhea. No abdominal pain or blood.  GENITOURINARY: No burning on urination, no polyuria NEUROLOGICAL: No headache, dizziness, syncope, paralysis, ataxia, numbness or tingling in the extremities. No change in bowel or bladder control.  MUSCULOSKELETAL: No muscle, back pain, joint pain or stiffness.  LYMPHATICS: No enlarged nodes. No history of splenectomy.  PSYCHIATRIC: No history of depression or anxiety.  ENDOCRINOLOGIC: No reports of sweating, cold or heat intolerance. No polyuria or polydipsia.  Marland Kitchen   Physical Examination There were no vitals filed for this visit. There were no vitals filed for this visit.  Gen: resting comfortably, no acute distress HEENT: no scleral icterus, pupils equal round and reactive,  no palptable cervical adenopathy,  CV Resp: Clear to auscultation bilaterally GI: abdomen is soft, non-tender, non-distended, normal bowel sounds, no hepatosplenomegaly MSK: extremities are warm, no edema.  Skin: warm, no rash Neuro:  no focal deficits Psych: appropriate affect   Diagnostic Studies  06/2014 Cath HEMODYNAMICS:   AO SYSTOLIC/AO DIASTOLIC: 893/81  LV SYSTOLIC/LV DIASTOLIC: 017/51  ANGIOGRAPHIC RESULTS:   1. Left main; normal  2. LAD; minor irregularities 3. Left circumflex; codominant with occluded mid AV groove after OM 3.  4. Right coronary artery; codominant with 40-50% segmental mid 7. Left ventriculography; RAO left ventriculogram was performed using  25 mL of Visipaque dye at 12 mL/second. The overall LVEF estimated  45-50 % With wall motion abnormalities moderate inferoapical and posterolateral hypokinesia  IMPRESSION:Mr. Utz has been occluded codominant circumflex with otherwise noncritical CAD. We will proceed with direct intervention using Angiomax, Brilenta, and drug eluting stent.  Procedure description: The patient received Angiomax bolus and infusion with an ACT of 435. A total of 185 mL of contrast was administered to the patient. Using a 6 [place] XB 3.5 similar guide catheter along with a 1/190 cm long pro-water guidewire and a 2 mm x 12 mm Euphora balloon the lesion was crossed and angioplasty performed. The door to balloon time was 26 minutes. Following this A 2.75 mm x 23 mm long Xience drug-eluting stent was then carefully positioned and deployed at 12 atm and postdilated with a 3 mm x 20 mm long [place] Euphora balloon at 15 atm (3.1 mm) resulting in reduction with total occlusion to 0% residual with TIMI-3 flow and no dissection. The patient tolerated the procedure well. There are no hemodynamic or electrocardiographic sequela. The guidewire and catheter were removed and the sheath was secured.   Final impression: successful PCI and  stenting of a occluded mid codominant circumflex in the setting of an acute inferolateral myocardial infarction with a door to balloon time 26 minutes using Angiomax, Proventil and drug-eluting stent. The patient had noncritical disease otherwise with preserved LV function. He'll be treated with usual medications including beta blocker, statin drug and ACE inhibitor. His sheath will be removed in several hours and pressure held. He will be fast tracked with anticipated discharge in 48-72 hours. He left the lab in stable condition.   11/2014 echo Study Conclusions  - Left ventricle: The cavity size was normal. Wall thickness was increased in a pattern of mild LVH. Systolic function was normal. The estimated ejection fraction was in the range of 55% to 60%. Images were inadequate for LV wall motion assessment. However, no gross regional variation on available images. Diastolic  dysfunction, grade indeterminate. - Aortic valve: Mildly to moderately calcified annulus. Mildly calcified leaflets. - Mitral valve: There was trivial regurgitation.  03/2015 PFTs: technically difficult due to poor patient cooperation. Was some evidence of obstruction.   09/2017 nuclear stress  Blood pressure demonstrated a normal response to exercise.  There was no ST segment deviation noted during stress.  Findings consistent with prior myocardial infarction with peri-infarct ischemia.  This is an intermediate risk study.  The left ventricular ejection fraction is moderately decreased (30-44%).  08/2019 echo IMPRESSIONS    1. Left ventricular ejection fraction, by estimation, is 20 to 25%. The  left ventricle has severely decreased function. The left ventricle  demonstrates global hypokinesis. There is mild left ventricular  hypertrophy of the basal-septal segment. Left  ventricular diastolic parameters are consistent with Grade III diastolic  dysfunction (restrictive). Elevated left  ventricular end-diastolic  pressure.  2. Right ventricular systolic function is severely reduced. The right  ventricular size is mildly enlarged.  3. Left atrial size was mild to moderately dilated.  4. Right atrial size was mildly dilated.  5. The mitral valve is grossly normal. Mild mitral valve regurgitation.  6. The aortic valve is tricuspid. Aortic valve regurgitation is not  visualized.  7. The inferior vena cava is dilated in size with <50% respiratory  variability, suggesting right atrial pressure of 15 mmHg.    Assessment and Plan  1. CAD -no symptoms, continue current meds.   2. Chronic systolic HF - weights stable, chronic mild bilateral LE edema - from 10/18/19 discharge summary lisinopril was stopped, does not indicate why. He is not sure if he is still taking or not. Would plan to d/c lisinopril and start entresto once he verfies if still taking or not, he will call us later today   3. Afib - no symptoms, continue eliquis and bisoprolol  4. Hypokalemia - repeat labs   F/u 3 weeks, further titration of CHF meds at that time.       Arnoldo Lenis, M.D.

## 2020-01-13 NOTE — Progress Notes (Deleted)
Cardiology Office Note  Date: 01/13/2020   ID: Eugene Parker, DOB 1960/12/01, MRN [id number]  PCP:  Patient, No Pcp Per  Cardiologist:  Carlyle Dolly, MD Electrophysiologist:  None   Chief Complaint: Chronic systolic heart failure  History of Present Illness: Eugene Parker is a 59 y.o. male with a history of chronic systolic heart failure, CAD ( STEMI status post DES to LCx 2016), hypokalemia, COPD, PAD, atrial fibrillation.  Cardiac catheterization 09/05/2019: Mid LAD 75%, D1 100% CTO, LCx 25% mid, RCA occluded.  DES to LAD, PTCA of D1.  Last encounter with Dr. Harl Bowie 10/24/2019: Had reported some recent chest pains were improved with diazepam for muscle spasms.  Compliant with cardiac medications.  Having some leg edema at times home weights were stable around 210 pounds.  10/2019 admission with hypokalemia K of 2.9.  Lisinopril was stopped.  Would plan DC lisinopril and start Entresto once patient verified if he was still taking the lisinopril or not.  COPD with abnormal PFTs 12/ 2016.  PAD followed by vascular.  Prior right SFA stenting, status post right femoral-popliteal bypass, A. fib with no recent palpitations or bleeding on Eliquis. BMP and Mag  ordered.   Past Medical History:  Diagnosis Date  . Anxiety   . CAD in native artery    a. STEMI 06/2014 s/p DES to Cx.  . Cellulitis and abscess of foot 03/22/2016  . Collagen vascular disease ([place])   . Current smoker   . Dental caries    periodontitis  . DJD (degenerative joint disease), lumbosacral   . Essential hypertension   . Heart attack ([place])   . Hiccups   . Hyperlipemia   . PAD (peripheral artery disease) ([place])   . Stroke (cerebrum) [facility])     Past Surgical History:  Procedure Laterality Date  . ABDOMINAL AORTOGRAM W/LOWER EXTREMITY N/A 12/21/2016   Procedure: ABDOMINAL AORTOGRAM W/LOWER EXTREMITY;  Surgeon: Waynetta Sandy, MD;  Location: [place] [facility];  Service: Cardiovascular;   Laterality: N/A;  . ABDOMINAL AORTOGRAM W/LOWER EXTREMITY N/A 11/01/2017   Procedure: ABDOMINAL AORTOGRAM W/LOWER EXTREMITY;  Surgeon: Waynetta Sandy, MD;  Location: [place] [facility];  Service: Cardiovascular;  Laterality: N/A;  . CORONARY STENT INTERVENTION N/A 08/30/2019   Procedure: CORONARY STENT INTERVENTION;  Surgeon: Jettie Booze, MD;  Location: [place] [facility];  Service: Cardiovascular;  Laterality: N/A;  . ENDARTERECTOMY FEMORAL Right 11/02/2017   Procedure: ENDARTERECTOMY RIGHT FEMORAL ARTERY;  Surgeon: Waynetta Sandy, MD;  Location: [place];  Service: Vascular;  Laterality: Right;  . FEMORAL-POPLITEAL BYPASS GRAFT Right 11/02/2017   Procedure: BYPASS GRAFT RIGHT FEMORAL TO BELOW KNEE POPLITEAL ARTERY USING RIGHT GREAT SAPHENOUS VEIN ;  Surgeon: Waynetta Sandy, MD;  Location: [facility];  Service: Vascular;  Laterality: Right;  . INTRADISCAL INJECTION  Oct 2012   L5-S1  . INTRAVASCULAR ULTRASOUND/IVUS N/A 08/30/2019   Procedure: Intravascular Ultrasound/IVUS;  Surgeon: Jettie Booze, MD;  Location: [place] [facility];  Service: Cardiovascular;  Laterality: N/A;  . LEFT HEART CATHETERIZATION WITH CORONARY ANGIOGRAM N/A 07/04/2014   Procedure: LEFT HEART CATHETERIZATION WITH CORONARY ANGIOGRAM;  Surgeon: Lorretta Harp, MD;  Location: [facility] CATH [facility];  Service: Cardiovascular;  Laterality: N/A;  . LEG SURGERY    . PERIPHERAL VASCULAR CATHETERIZATION N/A 02/03/2016   Procedure: Abdominal Aortogram;  Surgeon: Waynetta Sandy, MD;  Location: [place] [facility];  Service: Cardiovascular;  Laterality: N/A;  . PERIPHERAL VASCULAR CATHETERIZATION  N/A 02/03/2016   Procedure: Lower Extremity Angiography;  Surgeon: Waynetta Sandy, MD;  Location: [place] [facility];  Service: Cardiovascular;  Laterality: N/A;  . PERIPHERAL VASCULAR CATHETERIZATION Right 02/03/2016   Procedure: Peripheral Vascular Intervention;  Surgeon: Waynetta Sandy, MD;  Location: [facility] [facility];  Service: Cardiovascular;  Laterality: Right;  SFA  . RIGHT/LEFT HEART CATH AND CORONARY ANGIOGRAPHY N/A 08/30/2019   Procedure: RIGHT/LEFT HEART CATH AND CORONARY ANGIOGRAPHY;  Surgeon: Jettie Booze, MD;  Location: [place] [facility];  Service: Cardiovascular;  Laterality: N/A;  . TOOTH EXTRACTION N/A 02/02/2018   Procedure: DENTAL RESTORATION/EXTRACTIONS;  Surgeon: Diona Browner, DDS;  Location: [place];  Service: Oral Surgery;  Laterality: N/A;  . VEIN HARVEST Right 11/02/2017   Procedure: VEIN HARVEST RIGHT GREAT SAPHENOUS VEIN;  Surgeon: Waynetta Sandy, MD;  Location: [place] [place];  Service: Vascular;  Laterality: Right;    Current Outpatient Medications  Medication Sig Dispense Refill  . apixaban (ELIQUIS) 5 MG TABS tablet Take 1 tablet (5 mg total) by mouth 2 (two) times daily. 120 tablet 2  . atorvastatin (LIPITOR) 40 MG tablet Take 1 tablet (40 mg total) by mouth daily at 6 PM. 60 tablet 2  . bisoprolol (ZEBETA) 5 MG tablet Take 1 tablet (5 mg total) by mouth daily. 60 tablet 2  . clopidogrel (PLAVIX) 75 MG tablet Take 1 tablet (75 mg total) by mouth daily. 60 tablet 2  . diazepam (VALIUM) 5 MG tablet Take 1 tablet (5 mg total) by mouth every 12 (twelve) hours as needed for muscle spasms (hiccups). 10 tablet 0  . feeding supplement, ENSURE ENLIVE, (ENSURE ENLIVE) LIQD Take 237 mLs by mouth 2 (two) times daily between meals. 237 mL 12  . furosemide (LASIX) 40 MG tablet Take 1 tablet (40 mg total) by mouth daily. 60 tablet 2  . isosorbide mononitrate (IMDUR) 30 MG 24 hr tablet Take 1 tablet (30 mg total) by mouth daily. 90 tablet 3  . nitroGLYCERIN (NITROSTAT) 0.4 MG SL tablet DISSOLVE ONE TABLET UNDER TONGUE EVERY 5 MINUTES UP TO 3 DOSES AS NEEDED FOR CHEST PAIN (Patient taking differently: Place 0.4 mg under the tongue every 5 (five) minutes as needed. ) 25 tablet 2  . Potassium Chloride ER 20 MEQ TBCR Take 20 mEq by mouth daily. 60 tablet  2  . silver sulfADIAZINE (SILVADENE) 1 % cream Apply topically 2 (two) times daily. 50 g 0  . spironolactone (ALDACTONE) 25 MG tablet Take 0.5 tablets (12.5 mg total) by mouth daily. 60 tablet 2   No current facility-administered medications for this visit.   Allergies:  Codeine   Social History: The patient  reports that he has been smoking cigarettes. He started smoking about 49 years ago. He has a 20.00 pack-year smoking history. He has never used smokeless tobacco. He reports current alcohol use. He reports previous drug use. Drug: Marijuana.   Family History: The patient's family history includes COPD in his mother; Heart disease in his father and mother.   ROS:  Please see the history of present illness. Otherwise, complete review of systems is positive for {NONE DEFAULTED:18576::"none"}.  All other systems are reviewed and negative.   Physical Exam: VS:  There were no vitals taken for this visit., BMI There is no height or weight on file to calculate BMI.  Wt Readings from Last 3 Encounters:  10/24/19 209 lb (94.8 kg)  10/18/19 212 lb 11.9 oz (96.5 kg)  09/11/19 215 lb 9.6  oz (97.8 kg)    General: Patient appears comfortable at rest. HEENT: Conjunctiva and lids normal, oropharynx clear with moist mucosa. Neck: Supple, no elevated JVP or carotid bruits, no thyromegaly. Lungs: Clear to auscultation, nonlabored breathing at rest. Cardiac: Regular rate and rhythm, no S3 or significant systolic murmur, no pericardial rub. Abdomen: Soft, nontender, no hepatomegaly, bowel sounds present, no guarding or rebound. Extremities: No pitting edema, distal pulses 2+. Skin: Warm and dry. Musculoskeletal: No kyphosis. Neuropsychiatric: Alert and oriented x3, affect grossly appropriate.  ECG:  {EKG/Telemetry Strips Reviewed:[id number]}  Recent Labwork: 08/26/2019: B Natriuretic Peptide 1,615.0 10/18/2019: ALT 13; AST 20; BUN 11; Creatinine, Ser 1.05; Hemoglobin 12.1; Magnesium 1.7; Platelets  214; Potassium 4.0; Sodium 133; TSH 0.840     Component Value Date/Time   CHOL 151 07/05/2014 0432   TRIG 127 07/05/2014 0432   HDL 28 (L) 07/05/2014 0432   CHOLHDL 5.4 07/05/2014 0432   VLDL 25 07/05/2014 0432   LDLCALC 98 07/05/2014 0432    Other Studies Reviewed Today:  Cardiac catheterization 08/30/2019 CORONARY STENT INTERVENTION  Intravascular Ultrasound/IVUS  RIGHT/LEFT HEART CATH AND CORONARY ANGIOGRAPHY  Conclusion   Prox RCA lesion is 100% stenosed. Left to right collaterals.  Mid Cx lesion is 25% stenosed.  Mid LAD lesion is 75% stenosed. A drug-eluting stent was successfully placed using a SYNERGY XD 3.0X28, postdilated to 3.5 mm proximally and optimized with IVUS.  Post intervention, there is a 0% residual stenosis.  1st Diag lesion is 100% stenosed. This was a chronic total occlusion. Balloon angioplasty was performed using a BALLOON SAPPHIRE 2.0X12.  Post intervention, there is a 40% residual stenosis. Vessel appears chronically underfilled compared to prior films. Hopefully, this will improve over time.  LV end diastolic pressure is mildly elevated.  There is no aortic valve stenosis.  Hemodynamic findings consistent with mild pulmonary hypertension.  Ao sat 98%, PA sat 62%, mean PA pressure 31 mm Hg; mean PCWP 24 mm Hg; CO 4.9 L/min; CI 2.25  Balloon angioplasty was performed using a BALLOON SAPPHIRE 2.0X12.    Echocardiogram 08/27/2019 1. Left ventricular ejection fraction, by estimation, is 20 to 25%. The left ventricle has severely decreased function. The left ventricle demonstrates global hypokinesis. There is mild left ventricular hypertrophy of the basal-septal segment. Left ventricular diastolic parameters are consistent with Grade III diastolic dysfunction (restrictive). Elevated left ventricular enddiastolic pressure. 2. Right ventricular systolic function is severely reduced. The right ventricular size is mildly enlarged. 3. Left atrial size  was mild to moderately dilated. 4. Right atrial size was mildly dilated. 5. The mitral valve is grossly normal. Mild mitral valve regurgitation. 6. The aortic valve is tricuspid. Aortic valve regurgitation is not visualized. 7. The inferior vena cava is dilated in size with <50% respiratory variability, suggesting right atrial pressure of 15 mmHg.   Diagnostic Studies 06/2014 Cath HEMODYNAMICS:   AO SYSTOLIC/AO DIASTOLIC: 588/50  LV SYSTOLIC/LV DIASTOLIC: 277/41  ANGIOGRAPHIC RESULTS:   1. Left main; normal  2. LAD; minor irregularities 3. Left circumflex; codominant with occluded mid AV groove after OM 3.  4. Right coronary artery; codominant with 40-50% segmental mid 7. Left ventriculography; RAO left ventriculogram was performed using  25 mL of Visipaque dye at 12 mL/second. The overall LVEF estimated  45-50 % With wall motion abnormalities moderate inferoapical and posterolateral hypokinesia  IMPRESSION:Eugene Parker has been occluded codominant circumflex with otherwise noncritical CAD. We will proceed with direct intervention using Angiomax, Brilenta, and drug eluting stent.  Procedure description: The patient  received Angiomax bolus and infusion with an ACT of 435. A total of 185 mL of contrast was administered to the patient. Using a 6 [place] XB 3.5 similar guide catheter along with a 1/190 cm long pro-water guidewire and a 2 mm x 12 mm Euphora balloon the lesion was crossed and angioplasty performed. The door to balloon time was 26 minutes. Following this A 2.75 mm x 23 mm long Xience drug-eluting stent was then carefully positioned and deployed at 12 atm and postdilated with a 3 mm x 20 mm long [employer] Euphora balloon at 15 atm (3.1 mm) resulting in reduction with total occlusion to 0% residual with TIMI-3 flow and no dissection. The patient tolerated the procedure well. There are no hemodynamic or electrocardiographic sequela. The guidewire and catheter were removed and  the sheath was secured.   Final impression: successful PCI and stenting of a occluded mid codominant circumflex in the setting of an acute inferolateral myocardial infarction with a door to balloon time 26 minutes using Angiomax, Proventil and drug-eluting stent. The patient had noncritical disease otherwise with preserved LV function. He'll be treated with usual medications including beta blocker, statin drug and ACE inhibitor. His sheath will be removed in several hours and pressure held. He will be fast tracked with anticipated discharge in 48-72 hours. He left the lab in stable condition.   11/2014 echo Study Conclusions  - Left ventricle: The cavity size was normal. Wall thickness was increased in a pattern of mild LVH. Systolic function was normal. The estimated ejection fraction was in the range of 55% to 60%. Images were inadequate for LV wall motion assessment. However, no gross regional variation on available images. Diastolic dysfunction, grade indeterminate. - Aortic valve: Mildly to moderately calcified annulus. Mildly calcified leaflets. - Mitral valve: There was trivial regurgitation.  03/2015 PFTs: technically difficult due to poor patient cooperation. Was some evidence of obstruction.   09/2017 nuclear stress  Blood pressure demonstrated a normal response to exercise.  There was no ST segment deviation noted during stress.  Findings consistent with prior myocardial infarction with peri-infarct ischemia.  This is an intermediate risk study.  The left ventricular ejection fraction is moderately decreased (30-44%).   Assessment and Plan:  1. CAD in native artery   2. Chronic systolic heart failure ([place])   3. Atrial fibrillation, unspecified type ([place])   4. Hypokalemia      Medication Adjustments/Labs and Tests Ordered: Current medicines are reviewed at length with the patient today.  Concerns regarding medicines are outlined above.    Disposition: Follow-up with ***  Signed, Levell July, NP 01/13/2020 8:53 PM    [place] [facility] [address], [place], [place] [address] Phone: [phone]; Fax: [phone]

## 2020-01-14 ENCOUNTER — Encounter: Payer: Self-pay | Admitting: Family Medicine

## 2020-01-14 ENCOUNTER — Ambulatory Visit: Payer: Medicare Other | Admitting: Family Medicine

## 2020-05-19 DEATH — deceased

## 2020-06-23 ENCOUNTER — Other Ambulatory Visit: Payer: Self-pay | Admitting: Cardiology

## 2020-07-23 ENCOUNTER — Other Ambulatory Visit: Payer: Self-pay | Admitting: Family Medicine

## 2020-08-13 ENCOUNTER — Other Ambulatory Visit: Payer: Self-pay

## 2020-08-13 MED ORDER — SPIRONOLACTONE 25 MG PO TABS: 12.5000 mg | ORAL_TABLET | Freq: Every day | ORAL | 0 refills | Status: AC

## 2020-08-13 NOTE — Telephone Encounter (Signed)
This is a [place] pt, Dr. Branch 

## 2020-11-02 ENCOUNTER — Telehealth: Payer: Self-pay

## 2020-11-02 ENCOUNTER — Other Ambulatory Visit: Payer: Self-pay

## 2020-11-02 DIAGNOSIS — I779 Disorder of arteries and arterioles, unspecified: Secondary | ICD-10-CM

## 2020-11-02 NOTE — Telephone Encounter (Signed)
Patient calls today to report "3-4 holes in my right foot turning green." Patient is s/p R FPBG in 2019 - by [facility], has not followed up with [facility] since 2020. Patient says that his foot has been like this for several months and he thinks "it's time to cut it off." He does not know if he has had a fever, says he is cold and shivers sometimes. Placed patient on the schedule for ABI and wound check.

## 2020-11-04 ENCOUNTER — Inpatient Hospital Stay (HOSPITAL_COMMUNITY): Admission: RE | Admit: 2020-11-04 | Payer: Medicare Other | Source: Ambulatory Visit

## 2020-12-02 IMAGING — DX DG CHEST 1V PORT
1 series · 1 of 1 positions shown · non-contrast
Comparison: November 21, 2018

CLINICAL DATA: Shortness of breath and lower extremity swelling

EXAM:
PORTABLE CHEST 1 VIEW

[chest ap]
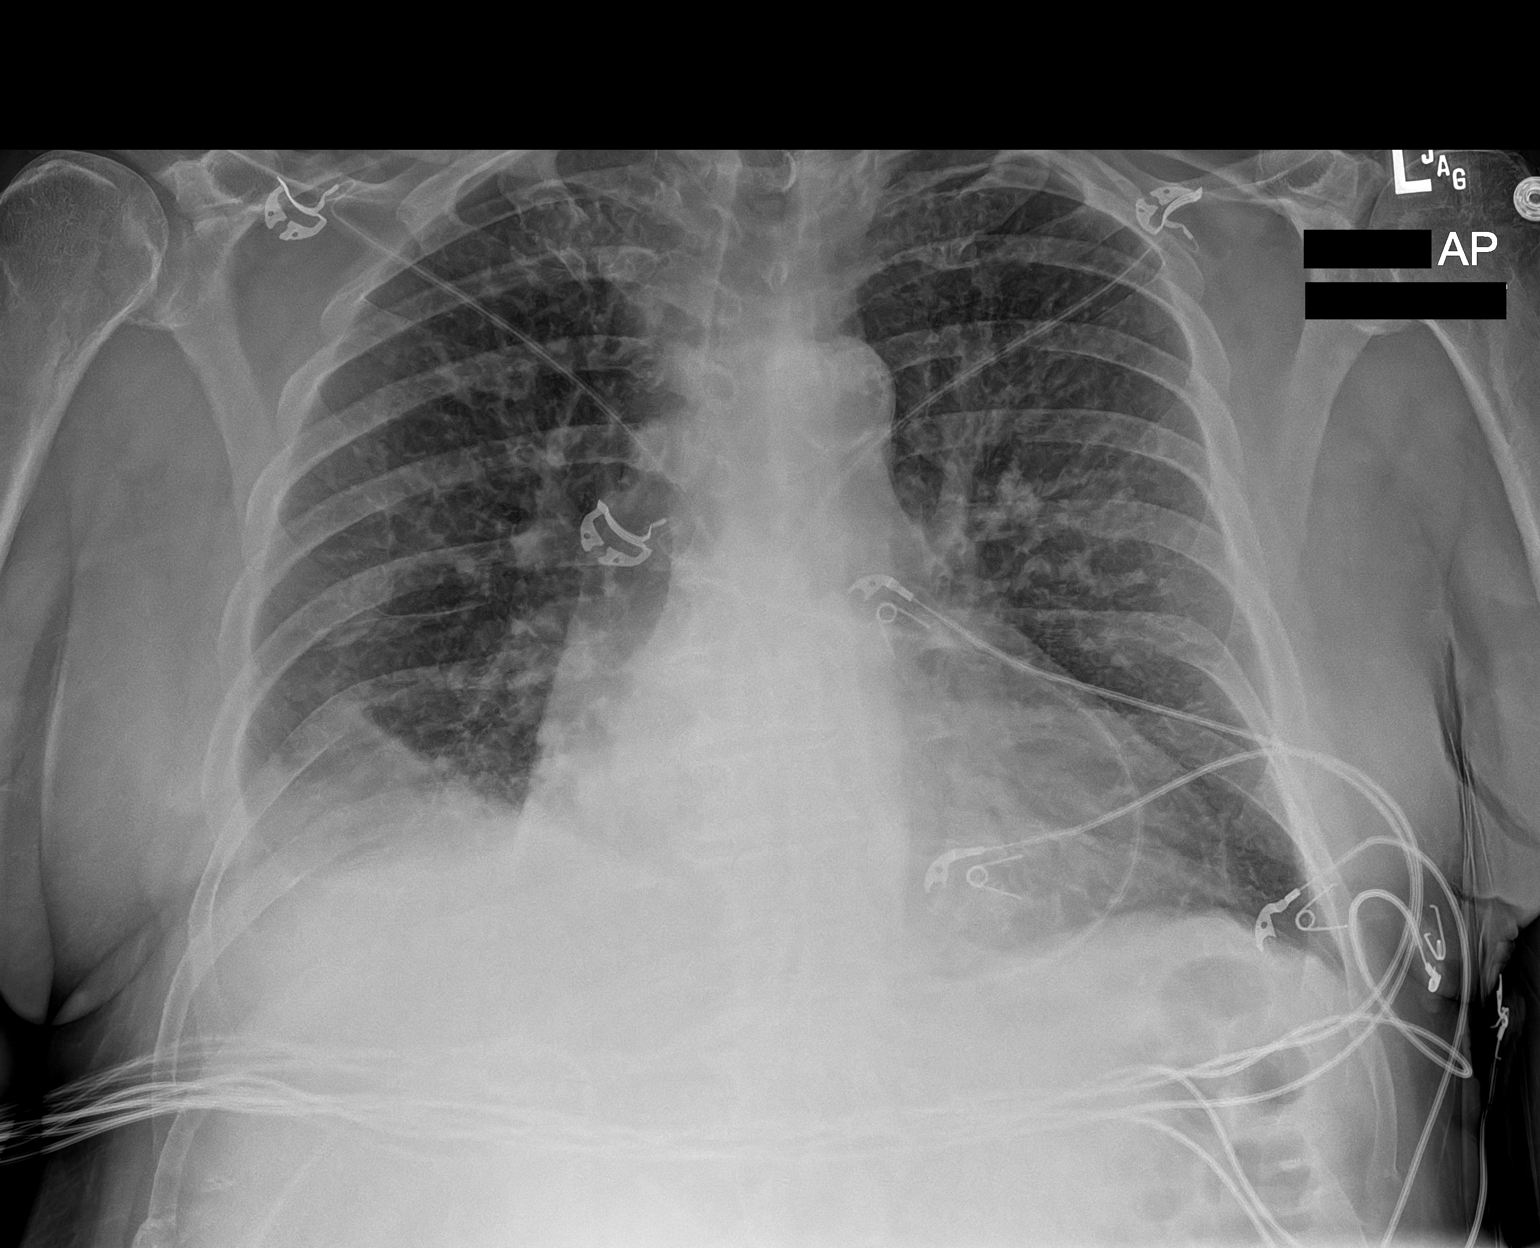

[1 of 1 positions shown; findings below may reference images not displayed]

FINDINGS: There is mild cardiomegaly. Prominence of the central pulmonary
vasculature is seen. There is a probable trace right pleural
effusion. Aortic knob calcifications are seen. No large airspace
consolidation is noted. No acute osseous abnormality.
IMPRESSION: Stable cardiomegaly and pulmonary vascular congestion.

## 2020-12-28 NOTE — Congregational Nurse Program (Signed)
  Dept: [phone]   Congregational Nurse Program Note  Date of Encounter: 12/04/2020  Past Medical History: Past Medical History:  Diagnosis Date   Anxiety    CAD in native artery    a. STEMI 06/2014 s/p DES to Cx.   Cellulitis and abscess of foot 03/22/2016   Collagen vascular disease ([facility])    Current smoker    Dental caries    periodontitis   DJD (degenerative joint disease), lumbosacral    Essential hypertension    Heart attack ([facility])    Hiccups    Hyperlipemia    PAD (peripheral artery disease) ([facility])    Stroke (cerebrum) ([facility])     Encounter Details:  CNP Questionnaire - 12/28/20 2239       Questionnaire   Do you give verbal consent to treat you today? Yes    Visit Setting [employer] or Engineer, technical sales Patient Served At [employer], [employer] he PCP retired and he is waiting to find a new MD in this area. Has called several Mds but some are not taking new patients at this time. Waiting to hear from the hospital. Is running out of medications and some of his meds ran out several months ago.Asked if he needed medication assistance,but refused at this time. BP  97/69 P102 Jenene Slicker RN, [place], [phone]

## 2021-03-18 DEATH — deceased
